# Patient Record
Sex: Male | Born: 1950 | Race: White | Hispanic: No | Marital: Married | Smoking: Never smoker
Health system: Southern US, Community
[De-identification: ages and names within clinical notes are randomized; demographics above are authoritative.]

## PROBLEM LIST (undated history)

## (undated) DIAGNOSIS — F419 Anxiety disorder, unspecified: Secondary | ICD-10-CM

## (undated) DIAGNOSIS — C801 Malignant (primary) neoplasm, unspecified: Secondary | ICD-10-CM

## (undated) DIAGNOSIS — E785 Hyperlipidemia, unspecified: Secondary | ICD-10-CM

## (undated) DIAGNOSIS — E1165 Type 2 diabetes mellitus with hyperglycemia: Secondary | ICD-10-CM

## (undated) DIAGNOSIS — N529 Male erectile dysfunction, unspecified: Secondary | ICD-10-CM

## (undated) DIAGNOSIS — I639 Cerebral infarction, unspecified: Secondary | ICD-10-CM

## (undated) DIAGNOSIS — K219 Gastro-esophageal reflux disease without esophagitis: Secondary | ICD-10-CM

## (undated) DIAGNOSIS — M1990 Unspecified osteoarthritis, unspecified site: Secondary | ICD-10-CM

## (undated) DIAGNOSIS — I10 Essential (primary) hypertension: Secondary | ICD-10-CM

## (undated) DIAGNOSIS — E119 Type 2 diabetes mellitus without complications: Secondary | ICD-10-CM

## (undated) DIAGNOSIS — G4733 Obstructive sleep apnea (adult) (pediatric): Secondary | ICD-10-CM

## (undated) HISTORY — DX: Cerebral infarction, unspecified: I63.9

## (undated) HISTORY — DX: Hyperlipidemia, unspecified: E78.5

## (undated) HISTORY — DX: Essential (primary) hypertension: I10

## (undated) HISTORY — PX: COLONOSCOPY: SHX174

## (undated) HISTORY — DX: Obstructive sleep apnea (adult) (pediatric): G47.33

## (undated) HISTORY — PX: CARDIAC CATHETERIZATION: SHX172

## (undated) HISTORY — DX: Male erectile dysfunction, unspecified: N52.9

## (undated) HISTORY — PX: TONSILLECTOMY: SUR1361

## (undated) HISTORY — PX: KNEE ARTHROSCOPY: SHX127

## (undated) HISTORY — DX: Gastro-esophageal reflux disease without esophagitis: K21.9

---

## 2004-10-06 ENCOUNTER — Other Ambulatory Visit: Payer: Self-pay

## 2004-10-06 ENCOUNTER — Emergency Department: Payer: Self-pay | Admitting: Emergency Medicine

## 2004-10-12 ENCOUNTER — Ambulatory Visit: Payer: Self-pay | Admitting: Gerontology

## 2006-09-27 IMAGING — CT CT HEAD WITHOUT CONTRAST
1 series · 16 of 30 positions shown, 20 images · non-contrast
Comparison: none

REASON FOR EXAM: Dizziness
COMMENTS:

[Series 2: without · axial · non-contrast · 0.39mm/px · z∈[+282,+417]mm · 16 of 30 slices shown, 20 images]
[im 2/30  brain]
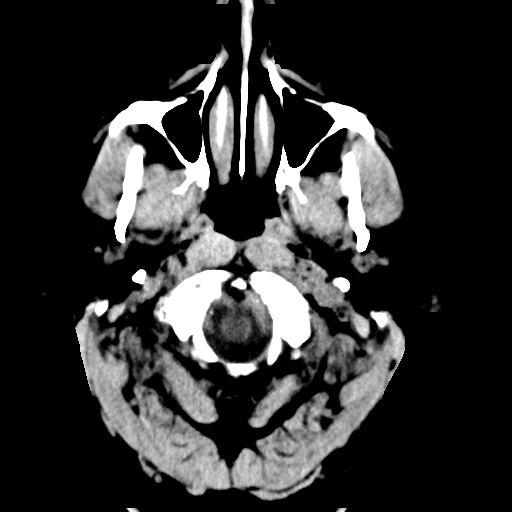
[im 2/30  bone]
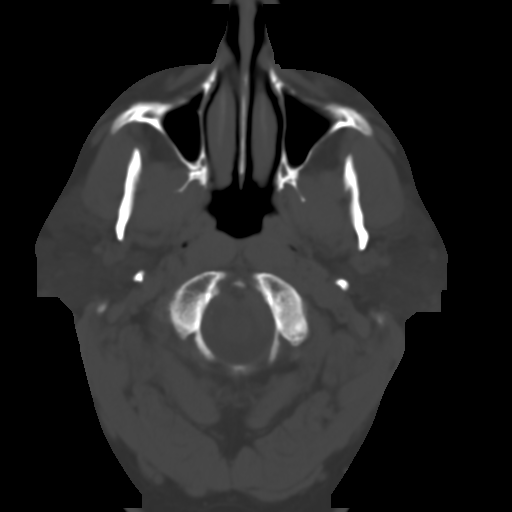
[im 4/30  brain]
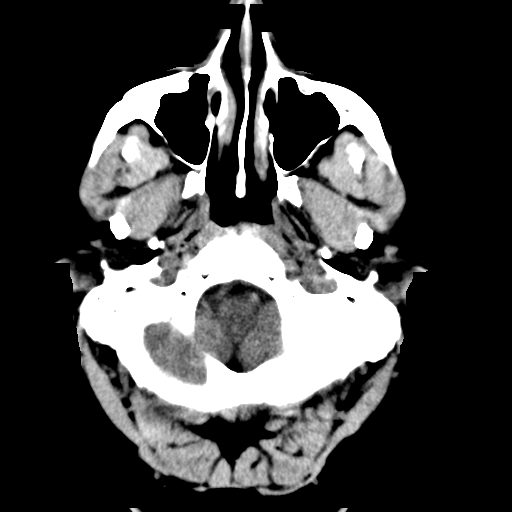
[im 6/30  brain]
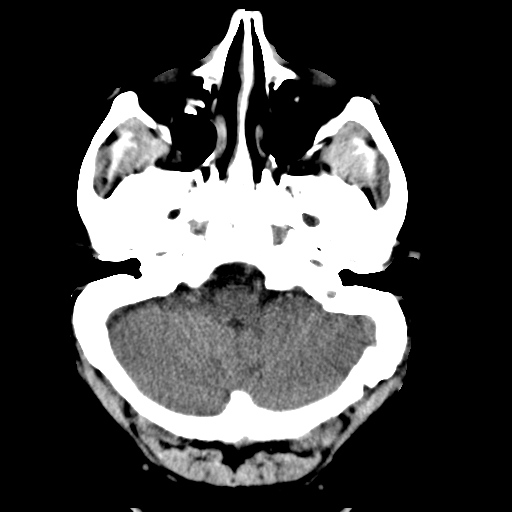
[im 8/30  brain]
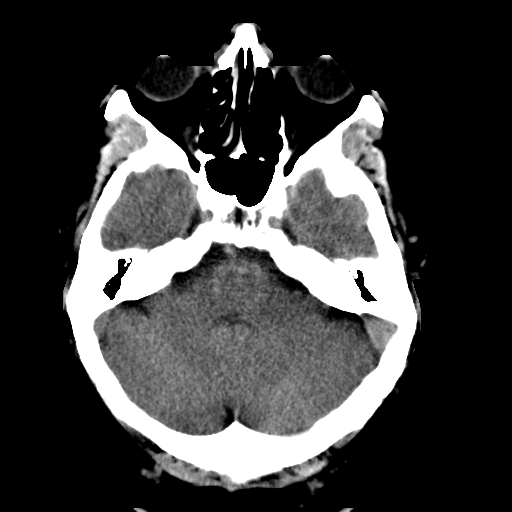
[im 9/30  brain]
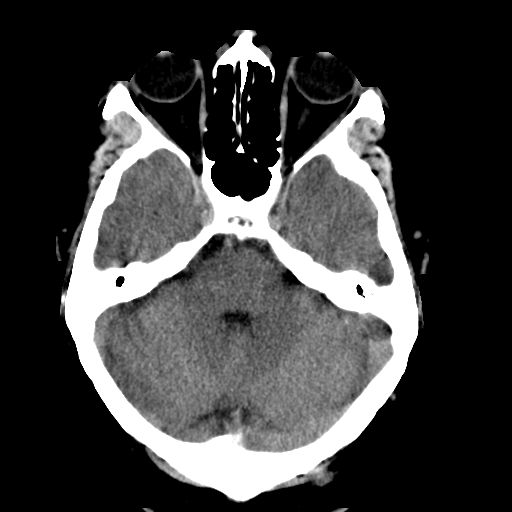
[im 9/30  bone]
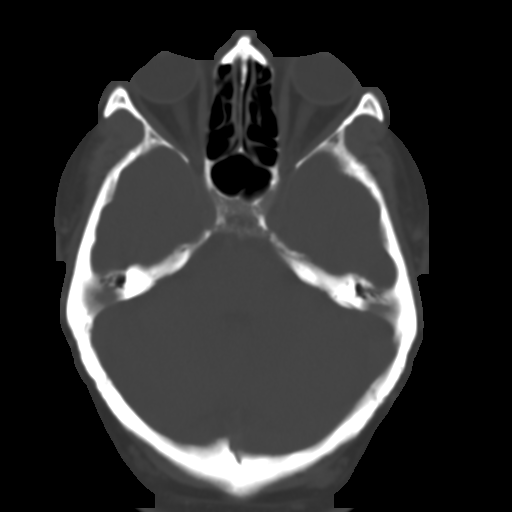
[im 11/30  brain]
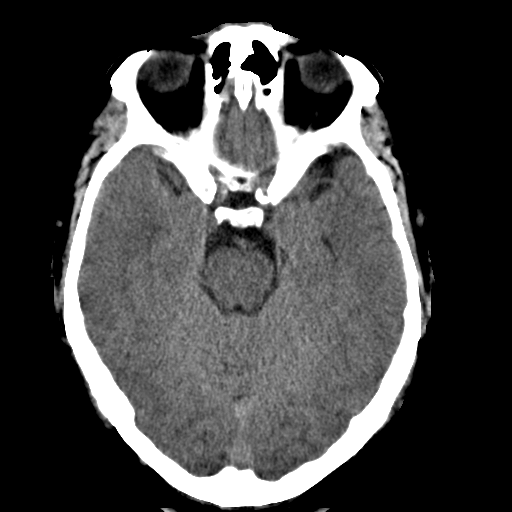
[im 13/30  brain]
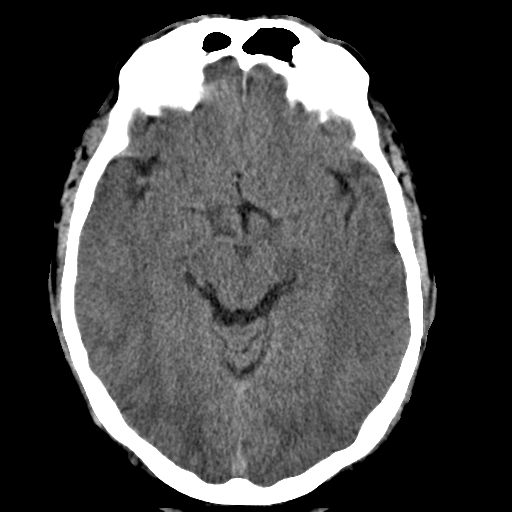
[im 15/30  brain]
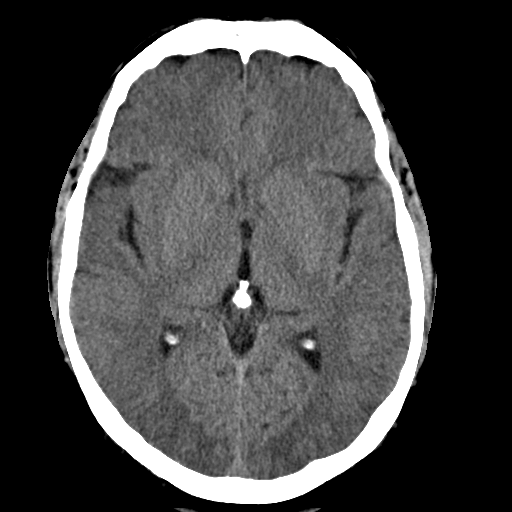
[im 16/30  brain]
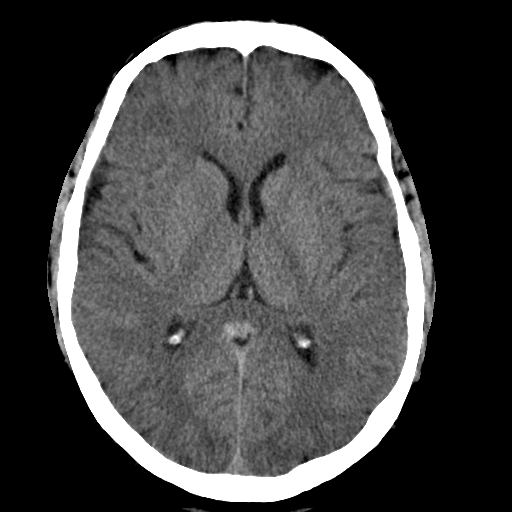
[im 16/30  bone]
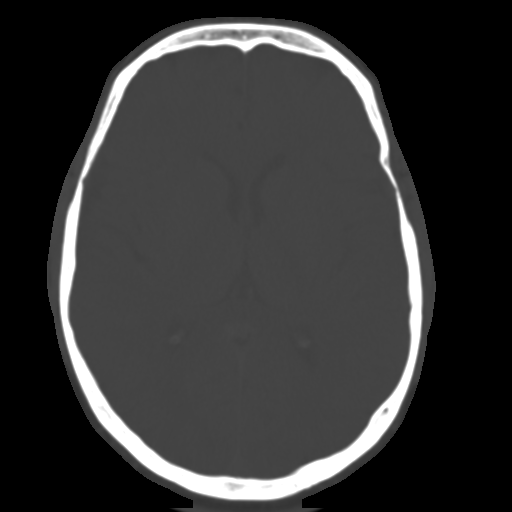
[im 18/30  brain]
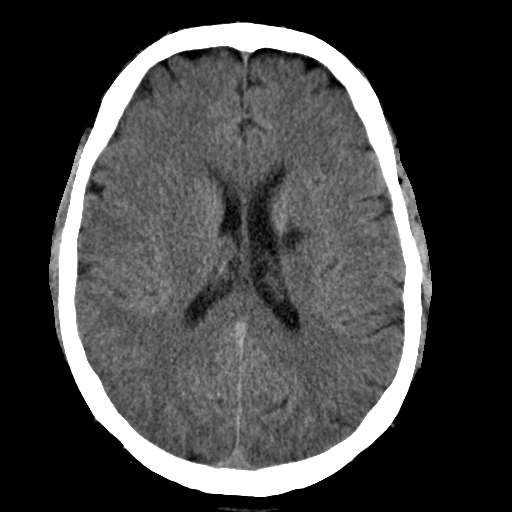
[im 20/30  brain]
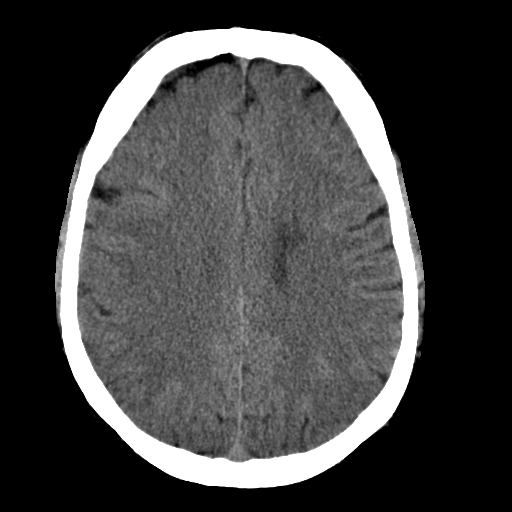
[im 22/30  brain]
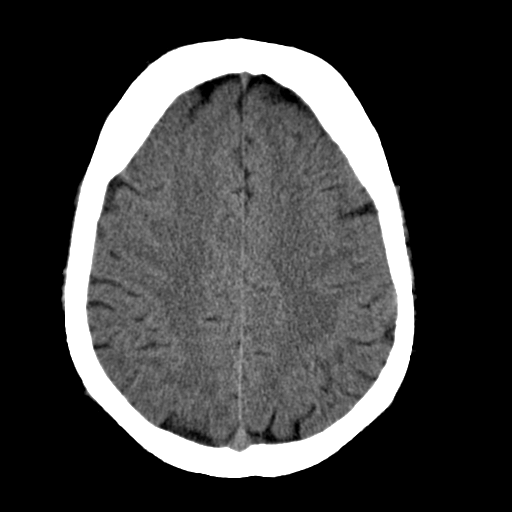
[im 23/30  brain]
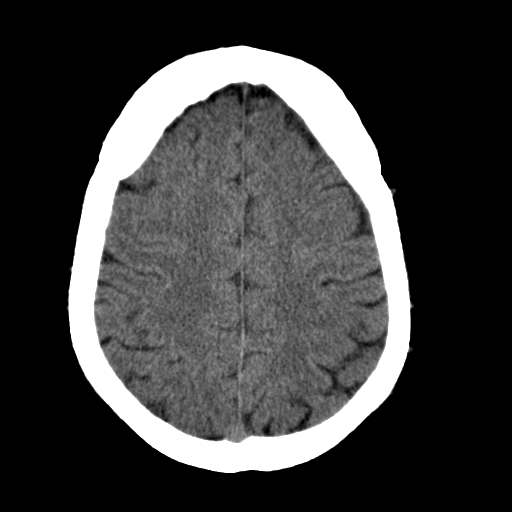
[im 23/30  bone]
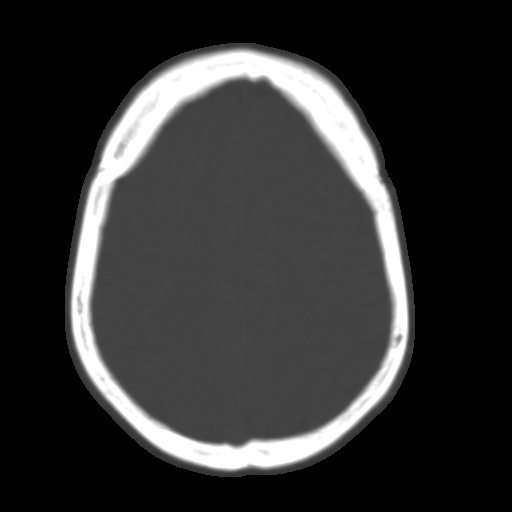
[im 25/30  brain]
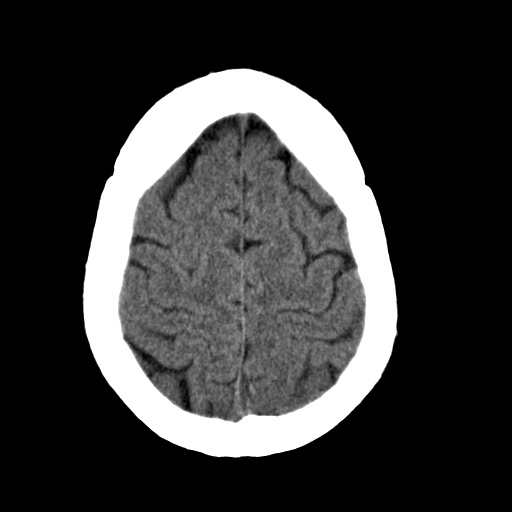
[im 27/30  brain]
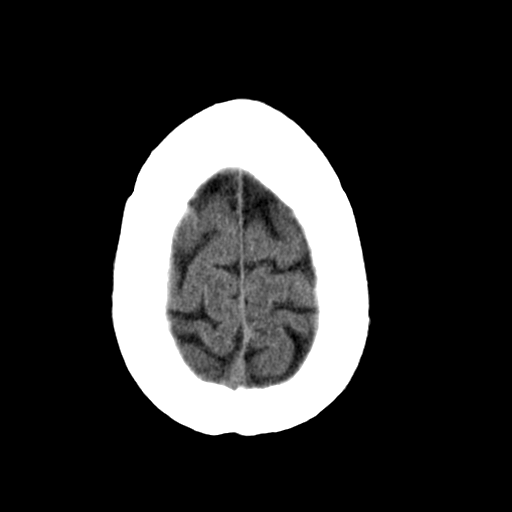
[im 29/30  brain]
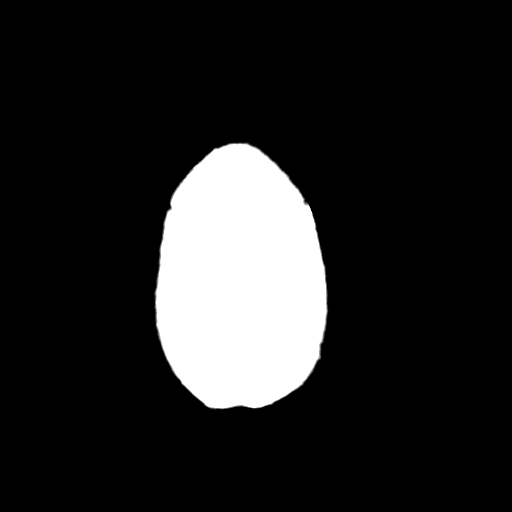

[16 of 30 positions shown; findings below may reference images not displayed]

PROCEDURE:     CT  - CT HEAD WITHOUT CONTRAST  - October 06, 2004  [DATE]

RESULT:     Noncontrast emergent CT of the brain shows an old area of
encephalomalacia in a small area adjacent to the lateral ventricle on the
LEFT in the parietal region.  There is no other encephalomalacia.  The
ventricles and sulci appear to be within normal limits.  The visualized
paranasal sinuses are normal.  There is no hemorrhage, mass effect or
midline shift.
IMPRESSION: 1.     No acute intracranial abnormality.
2.     Old LEFT parietal periventricular white matter infarct.

## 2007-02-27 HISTORY — PX: SHOULDER ARTHROSCOPY: SHX128

## 2007-03-18 ENCOUNTER — Ambulatory Visit: Payer: Self-pay

## 2007-04-15 ENCOUNTER — Ambulatory Visit: Payer: Self-pay | Admitting: Orthopaedic Surgery

## 2007-04-15 ENCOUNTER — Other Ambulatory Visit: Payer: Self-pay

## 2007-04-22 ENCOUNTER — Ambulatory Visit: Payer: Self-pay | Admitting: Orthopaedic Surgery

## 2007-07-10 ENCOUNTER — Emergency Department: Payer: Self-pay | Admitting: Unknown Physician Specialty

## 2008-05-30 ENCOUNTER — Emergency Department: Payer: Self-pay | Admitting: Emergency Medicine

## 2013-06-06 DIAGNOSIS — E1122 Type 2 diabetes mellitus with diabetic chronic kidney disease: Secondary | ICD-10-CM | POA: Insufficient documentation

## 2013-06-06 DIAGNOSIS — I10 Essential (primary) hypertension: Secondary | ICD-10-CM | POA: Insufficient documentation

## 2013-06-06 DIAGNOSIS — E1169 Type 2 diabetes mellitus with other specified complication: Secondary | ICD-10-CM | POA: Insufficient documentation

## 2013-06-06 DIAGNOSIS — E785 Hyperlipidemia, unspecified: Secondary | ICD-10-CM | POA: Insufficient documentation

## 2013-06-06 DIAGNOSIS — G4733 Obstructive sleep apnea (adult) (pediatric): Secondary | ICD-10-CM | POA: Insufficient documentation

## 2013-09-10 ENCOUNTER — Ambulatory Visit: Payer: Self-pay | Admitting: Gastroenterology

## 2013-09-11 LAB — PATHOLOGY REPORT

## 2015-01-26 DIAGNOSIS — Z0000 Encounter for general adult medical examination without abnormal findings: Secondary | ICD-10-CM | POA: Insufficient documentation

## 2015-02-07 ENCOUNTER — Encounter: Payer: Self-pay | Admitting: Dietician

## 2015-02-07 ENCOUNTER — Encounter: Payer: PPO | Attending: Internal Medicine | Admitting: Dietician

## 2015-02-07 VITALS — BP 139/62 | Ht 68.0 in | Wt 239.4 lb

## 2015-02-07 DIAGNOSIS — IMO0002 Reserved for concepts with insufficient information to code with codable children: Secondary | ICD-10-CM

## 2015-02-07 DIAGNOSIS — E119 Type 2 diabetes mellitus without complications: Secondary | ICD-10-CM | POA: Diagnosis not present

## 2015-02-07 DIAGNOSIS — E1065 Type 1 diabetes mellitus with hyperglycemia: Secondary | ICD-10-CM

## 2015-02-07 DIAGNOSIS — E108 Type 1 diabetes mellitus with unspecified complications: Secondary | ICD-10-CM

## 2015-02-07 NOTE — Progress Notes (Signed)
Diabetes Self-Management Education  Visit Type: First/Initial  Appt. Start Time: 1030 Appt. End Time: 1130  02/07/2015  [name], identified by name and date of birth, is a 64 y.o. male with a diagnosis of Diabetes: Type 2.   ASSESSMENT  Blood pressure 139/62, height 5\' 8"  (1.727 m), weight 239 lb 6.4 oz (108.591 kg). Body mass index is 36.41 kg/(m^2).  Lacks knowledge of diabetes care Pt has an old BG meter but is not checking BG's       Diabetes Self-Management Education - 02/07/15 1310    Visit Information   Visit Type First/Initial   Initial Visit   Diabetes Type Type 2   Health Coping   How would you rate your overall health? Good   Psychosocial Assessment   [name] Belief/Attitude about Diabetes Motivated to manage diabetes   Self-care barriers None   Other persons present Spouse/SO   [name] Concerns Glycemic Control;Weight Control;Healthy Lifestyle;Medication  prevent complications   Special Needs None   Preferred Learning Style Auditory;Hands on   Learning Readiness Ready   What is the last grade level you completed in school? 12   Complications   Last HgB A1C per [name]/outside source 8.3 %  01-19-15   How often do you check your blood sugar? 0 times/day (not testing)   Have you had a dilated eye exam in the past 12 months? Yes   Have you had a dental exam in the past 12 months? Yes   Are you checking your feet? No   Dietary Intake   Breakfast --  breakfast time varies 7-9am   Lunch --  eats lunch 1-2p; eats sweets/desserts 4-5x/wk. and snack foods/fried foods 2-3x/wk.   Dinner --  dinner time varies 5-7p   Snack (evening) --  eats bedtime snack occasionally=ice cream   Beverage(s) --  drinks occasional regular soda and drinks coffee and tea with sugar=4-5/day   Exercise   Exercise Type ADL's   [name] Education   Previous Diabetes Education No   Disease state  --  definition/pathophysiology  of type 2 diabetes and options for treatment   Nutrition management  Role of diet in the treatment of diabetes and the relationship between the three main macronutrients and blood glucose level;Food label reading, portion sizes and measuring food.;Carbohydrate counting   Physical activity and exercise  Role of exercise on diabetes management, blood pressure control and cardiac health.;Helped [name] identify appropriate exercises in relation to his/her diabetes, diabetes complications and other health issue.   Medications Reviewed patients medication for diabetes, action, purpose, timing of dose and side effects.   Monitoring Taught/evaluated SMBG meter.;Purpose and frequency of SMBG.;Taught/discussed recording of test results and interpretation of SMBG.;Identified appropriate SMBG and/or A1C goals.;Yearly dilated eye exam  gave pt Ultra One Touch 2 meter and instructed on its use=BG 158 (2hr pp)   Chronic complications Relationship between chronic complications and blood glucose control   Psychosocial adjustment Role of stress on diabetes   Personal strategies to promote health Lifestyle issues that need to be addressed for better diabetes care;Helped [name] develop diabetes management plan for (enter comment)      Individualized Plan for Diabetes Self-Management Training:   Learning Objective:  [name] will have a greater understanding of diabetes self-management. [name] education plan is to attend individual and/or group sessions per assessed needs and concerns.   Plan:   Check BG's 2x/day before breakfast and 2 hr pp supper and record   Try riding bike or walking 15-20 min. 3-4x/wk.  Avoid sweetened beverages and fruit juices  Limit intake of sweet/desserts, snack foods  and fried foods  Eat 3 balanced meals/day and a small bedtime snack  Make an eye appointment  Call doctor for prescription for: Ultra One Touch test strips and One Touch Delica lancets-check BG 2x/day  Complete a 3 day food record and bring to next  appointment  Return for follow up visit with a dietitian on 02-18-15   Expected Outcomes:   Good  Education material provided: General Meal Planning Guidelines, Ultra One Touch 2 meter  If problems or questions, [name] to contact team via:  [phone]  Future DSME appointment:  02-18-15

## 2015-02-07 NOTE — Patient Instructions (Signed)
  Check blood sugars 2 x day before breakfast and 2 hrs after supper every day Exercise:  Try riding bike or walking 15-20 min 3-4x/wk. Avoid sugar sweetened drinks (soda, tea, coffee, sports drinks, juices) Limit intake of sweets, snack foods and fried foods Eat 3 meals day,   1  snacks a day at bedtime Space meals 4-6 hours apart Make an eye doctor appointment Bring blood sugar records to the next appointment/class Call your doctor for a prescription for:  1. Meter strips (type)  Ultra One Touch test strips  checking  2 times per day  2. Lancets (type)  One Touch Delica lancets  checking  2   times per day Get a Sharps container Complete a 3 day food record and bring to next appointment Return for appointment on:  02-18-15

## 2015-02-17 ENCOUNTER — Ambulatory Visit: Payer: PPO | Attending: Internal Medicine

## 2015-02-17 DIAGNOSIS — G4733 Obstructive sleep apnea (adult) (pediatric): Secondary | ICD-10-CM | POA: Diagnosis not present

## 2015-02-18 ENCOUNTER — Ambulatory Visit: Payer: PPO | Admitting: Dietician

## 2015-02-22 ENCOUNTER — Encounter: Payer: PPO | Admitting: Dietician

## 2015-02-22 ENCOUNTER — Encounter: Payer: Self-pay | Admitting: Dietician

## 2015-02-22 VITALS — BP 136/76 | Ht 68.0 in | Wt 231.6 lb

## 2015-02-22 DIAGNOSIS — E119 Type 2 diabetes mellitus without complications: Secondary | ICD-10-CM | POA: Diagnosis not present

## 2015-02-22 NOTE — Patient Instructions (Addendum)
To balance meals with protein, 2-4 servings( 30-60 grams) of carbohydrate per meal, non-starchy vegetables. To include at least 2 servings of carbohydrate per meal. To add serving of fruit at lunch meal. To increase intake of fruits/vegetables with goal of 5 servings per day. To read labels for carbohydrate grams, saturated fat and trans fat with goal of no more than 12 gms saturated fat per day. To avoid trans fats.

## 2015-02-22 NOTE — Progress Notes (Signed)
Diabetes Self-Management Education  Visit Type:  Follow-up  Appt. Start Time: 1500 Appt. End Time: 1600  02/22/2015  [name] Patient, identified by name and date of birth, is a 64 y.o. male with a diagnosis of Diabetes:  .   ASSESSMENT  Blood pressure 136/76, height 5\' 8"  (1.727 m), weight 231 lb 9.6 oz (105.053 kg). Body mass index is 35.22 kg/(m^2).       Diabetes Self-Management Education - [id number]    Complications   Last HgB A1C per patient/outside source 8.3 %   How often do you check your blood sugar? 1-2 times/day   Fasting Blood glucose range (mg/dL) 130-179   Postprandial Blood glucose range (mg/dL) 130-179;180-200   Have you had a dilated eye exam in the past 12 months? Yes  02/2014   Have you had a dental exam in the past 12 months? Yes   Are you checking your feet? No   Dietary Intake   Breakfast 8:30 bacon/egg sandwich on whole wheat bread, coffee with cream, water or 2 eggs, 1/2c.grits, 12oz. milk, 1 sl. whole wheat toast    Lunch 12-1:30pm- chicken salad or bologna sandwich, water   Snack (afternoon) occasionally had peanut butter/crackers   Dinner 5:50-7:30pm grilled chicken salad with [place] drsg, water or hamburger steak with grilled onions, mushrooms, slaw, sweet potato, salad   Snack (evening) rarely eats an evening snack   Exercise   Exercise Type Light (walking / raking leaves)   How many days per week to you exercise? 7   How many minutes per day do you exercise? 15   Total minutes per week of exercise 105   Patient Education   Nutrition management  Role of diet in the treatment of diabetes and the relationship between the three main macronutrients and blood glucose level;Food label reading, portion sizes and measuring food.;Carbohydrate counting;Effects of alcohol on blood glucose and safety factors with consumption of alcohol.;Meal options for control of blood glucose level and chronic complications.   Physical activity and exercise  Role of  exercise on diabetes management, blood pressure control and cardiac health.   Chronic complications Assessed and discussed foot care and prevention of foot problems;Relationship between chronic complications and blood glucose control   Outcomes   Program Status Completed      Learning Objective:  Patient will have a greater understanding of diabetes self-management. Patient education plan is to attend individual and/or group sessions per assessed needs and concerns.   Plan:   Patient Instructions  To include at least 2 servings of carbohydrate per meal. To add serving of fruit at lunch meal. To increase intake of fruits/vegetables with goal of 5 servings per day. To read labels for carbohydrate grams, saturated fat and trans fat.    Expected Outcomes:  Demonstrated interest in learning. Expect positive outcomes  Education material provided:"Planning a Balanced Meal"  If problems or questions, patient to contact team via: Karolee Stamps, RD,CDE [phone] Future DSME appointment: - PRN

## 2018-11-25 DIAGNOSIS — M1711 Unilateral primary osteoarthritis, right knee: Secondary | ICD-10-CM | POA: Insufficient documentation

## 2019-03-17 ENCOUNTER — Encounter
Admission: RE | Admit: 2019-03-17 | Discharge: 2019-03-17 | Disposition: A | Discharge status: Home / Self Care | Payer: Medicare Other | Source: Ambulatory Visit | Attending: Orthopedic Surgery | Admitting: Orthopedic Surgery

## 2019-03-17 ENCOUNTER — Other Ambulatory Visit: Payer: Self-pay

## 2019-03-17 ENCOUNTER — Encounter: Payer: Self-pay | Admitting: Orthopedic Surgery

## 2019-03-17 NOTE — Patient Instructions (Addendum)
Your procedure is scheduled on: 03/20/19 Report to [facility]. To find out your arrival time please call [phone] between 1PM - 3PM on 03/19/19.  Remember: Instructions that are not followed completely may result in serious medical risk, up to and including death, or upon the discretion of your surgeon and anesthesiologist your surgery may need to be rescheduled.     _X__ 1. Do not eat food after midnight the night before your procedure.                 No gum chewing or hard candies. You may drink clear liquids up to 2 hours                 before you are scheduled to arrive for your surgery- DO not drink clear                 liquids within 2 hours of the start of your surgery.                 Clear Liquids include:  water, apple juice without pulp, clear carbohydrate                 drink such as Clearfast or Gatorade, Black Coffee or Tea (Do not add                 anything to coffee or tea). Diabetics water only  __X__2.  On the morning of surgery brush your teeth with toothpaste and water, you                 may rinse your mouth with mouthwash if you wish.  Do not swallow any              toothpaste of mouthwash.     _X__ 3.  No Alcohol for 24 hours before or after surgery.   _X__ 4.  Do Not Smoke or use e-cigarettes For 24 Hours Prior to Your Surgery.                 Do not use any chewable tobacco products for at least 6 hours prior to                 surgery.  ____  5.  Bring all medications with you on the day of surgery if instructed.   __X__  6.  Notify your doctor if there is any change in your medical condition      (cold, fever, infections).     Do not wear jewelry, make-up, hairpins, clips or nail polish. Do not wear lotions, powders, or perfumes.  Do not shave 48 hours prior to surgery. Men may shave face and neck. Do not bring valuables to the hospital.    [facility] is not responsible for any belongings or  valuables.  Contacts, dentures/partials or body piercings may not be worn into surgery. Bring a case for your contacts, glasses or hearing aids, a denture cup will be supplied. Leave your suitcase in the car. After surgery it may be brought to your room. For patients admitted to the hospital, discharge time is determined by your treatment team.   Patients discharged the day of surgery will not be allowed to drive home.   Please read over the following fact sheets that you were given:   MRSA Information  __X__ Take these medicines the morning of surgery with A SIP OF WATER:  1. aMLODIPINE  2. pEPCID  3. pRAVASTATIN  4.  5.  6.  ____ Fleet Enema (as directed)   __X__ Use CHG Soap/SAGE wipes as directed  ____ Use inhalers on the day of surgery  _X___ Stop metformin/Janumet/Farxiga 2 days prior to surgery    ____ Take 1/2 of usual insulin dose the night before surgery. No insulin the morning          of surgery.   ____ Stop Blood Thinners Coumadin/Plavix/Xarelto/Pleta/Pradaxa/Eliquis/Effient/Aspirin  on   Or contact your Surgeon, Cardiologist or Medical Doctor regarding  ability to stop your blood thinners  __X__ Stop Anti-inflammatories 7 days before surgery such as Advil, Ibuprofen, Motrin,  BC or Goodies Powder, Naprosyn, Naproxen, Aleve, Aspirin    __X__ Stop all herbal supplements, fish oil or vitamin E until after surgery.    __X__ Bring C-Pap to the hospital.      G2 Gatorade to drink 2 hours before arrival to day surgery.

## 2019-03-18 ENCOUNTER — Encounter
Admission: RE | Admit: 2019-03-18 | Discharge: 2019-03-18 | Disposition: A | Discharge status: Home / Self Care | Payer: Medicare Other | Source: Ambulatory Visit | Attending: Orthopedic Surgery | Admitting: Orthopedic Surgery

## 2019-03-18 DIAGNOSIS — Z20822 Contact with and (suspected) exposure to covid-19: Secondary | ICD-10-CM | POA: Diagnosis not present

## 2019-03-18 DIAGNOSIS — I4510 Unspecified right bundle-branch block: Secondary | ICD-10-CM | POA: Insufficient documentation

## 2019-03-18 DIAGNOSIS — R9431 Abnormal electrocardiogram [ECG] [EKG]: Secondary | ICD-10-CM | POA: Insufficient documentation

## 2019-03-18 DIAGNOSIS — Z01818 Encounter for other preprocedural examination: Secondary | ICD-10-CM | POA: Diagnosis not present

## 2019-03-18 LAB — COMPREHENSIVE METABOLIC PANEL
ALT: 32 U/L (ref 0–44)
AST: 23 U/L (ref 15–41)
Albumin: 4.2 g/dL (ref 3.5–5.0)
Alkaline Phosphatase: 54 U/L (ref 38–126)
Anion gap: 10 (ref 5–15)
BUN: 20 mg/dL (ref 8–23)
CO2: 27 mmol/L (ref 22–32)
Calcium: 9.8 mg/dL (ref 8.9–10.3)
Chloride: 100 mmol/L (ref 98–111)
Creatinine, Ser: 1.14 mg/dL (ref 0.61–1.24)
GFR calc Af Amer: >60 mL/min (ref >60)
GFR calc non Af Amer: >60 mL/min (ref >60)
Glucose, Bld: 133 mg/dL — ABNORMAL HIGH (ref 70–99)
Potassium: 4 mmol/L (ref 3.5–5.1)
Sodium: 137 mmol/L (ref 135–145)
Total Bilirubin: 0.9 mg/dL (ref 0.3–1.2)
Total Protein: 8.2 g/dL — ABNORMAL HIGH (ref 6.5–8.1)

## 2019-03-18 LAB — CBC
HCT: 47 % (ref 39.0–52.0)
Hemoglobin: 15.9 g/dL (ref 13.0–17.0)
MCH: 30 pg (ref 26.0–34.0)
MCHC: 33.8 g/dL (ref 30.0–36.0)
MCV: 88.7 fL (ref 80.0–100.0)
Platelets: 270 10*3/uL (ref 150–400)
RBC: 5.3 MIL/uL (ref 4.22–5.81)
RDW: 13.7 % (ref 11.5–15.5)
WBC: 8.2 10*3/uL (ref 4.0–10.5)
nRBC: 0 % (ref 0.0–0.2)

## 2019-03-18 LAB — PROTIME-INR
INR: 1 (ref 0.8–1.2)
Prothrombin Time: 12.9 seconds (ref 11.4–15.2)

## 2019-03-18 LAB — URINALYSIS, ROUTINE W REFLEX MICROSCOPIC
Bacteria, UA: NONE SEEN
Bilirubin Urine: NEGATIVE
Glucose, UA: NEGATIVE mg/dL
Hgb urine dipstick: NEGATIVE
Ketones, ur: NEGATIVE mg/dL
Nitrite: NEGATIVE
Protein, ur: NEGATIVE mg/dL
Specific Gravity, Urine: 1.009 (ref 1.005–1.030)
Squamous Epithelial / HPF: NONE SEEN (ref 0–5)
pH: 5 (ref 5.0–8.0)

## 2019-03-18 LAB — SURGICAL PCR SCREEN
MRSA, PCR: NEGATIVE
Staphylococcus aureus: NEGATIVE

## 2019-03-18 LAB — HEMOGLOBIN A1C
Hgb A1c MFr Bld: 6.9 % — ABNORMAL HIGH (ref 4.8–5.6)
Mean Plasma Glucose: 151.33 mg/dL

## 2019-03-18 LAB — APTT: aPTT: 30 seconds (ref 24–36)

## 2019-03-18 LAB — TYPE AND SCREEN
ABO/RH(D): O POS
Antibody Screen: NEGATIVE

## 2019-03-18 LAB — C-REACTIVE PROTEIN: CRP: 1.8 mg/dL — ABNORMAL HIGH (ref <1.0)

## 2019-03-18 LAB — SEDIMENTATION RATE: Sed Rate: 15 mm/hr (ref 0–20)

## 2019-03-18 LAB — SARS CORONAVIRUS 2 (TAT 6-24 HRS): SARS Coronavirus 2: NEGATIVE

## 2019-03-19 LAB — URINE CULTURE
Culture: NO GROWTH
Special Requests: NORMAL

## 2019-03-19 MED ORDER — TRANEXAMIC ACID-NACL 1000-0.7 MG/100ML-% IV SOLN
1000.0000 mg | INTRAVENOUS | Status: AC
  Administered 2019-03-20: 08:00:00 1000 mg via INTRAVENOUS

## 2019-03-20 ENCOUNTER — Other Ambulatory Visit: Payer: Self-pay

## 2019-03-20 ENCOUNTER — Ambulatory Visit: Payer: Medicare Other | Admitting: Anesthesiology

## 2019-03-20 ENCOUNTER — Ambulatory Visit
Admission: RE | Admit: 2019-03-20 | Discharge: 2019-03-20 | Disposition: A | Discharge status: Home / Self Care | Payer: Medicare Other | Attending: Orthopedic Surgery | Admitting: Orthopedic Surgery

## 2019-03-20 ENCOUNTER — Encounter: Payer: Self-pay | Admitting: Orthopedic Surgery

## 2019-03-20 ENCOUNTER — Ambulatory Visit: Payer: Medicare Other

## 2019-03-20 ENCOUNTER — Encounter
Admission: RE | Disposition: A | Discharge status: Home / Self Care | Payer: Self-pay | Source: Home / Self Care | Attending: Orthopedic Surgery

## 2019-03-20 DIAGNOSIS — Z833 Family history of diabetes mellitus: Secondary | ICD-10-CM | POA: Insufficient documentation

## 2019-03-20 DIAGNOSIS — N182 Chronic kidney disease, stage 2 (mild): Secondary | ICD-10-CM | POA: Insufficient documentation

## 2019-03-20 DIAGNOSIS — F329 Major depressive disorder, single episode, unspecified: Secondary | ICD-10-CM | POA: Diagnosis not present

## 2019-03-20 DIAGNOSIS — Z96652 Presence of left artificial knee joint: Secondary | ICD-10-CM

## 2019-03-20 DIAGNOSIS — M1712 Unilateral primary osteoarthritis, left knee: Secondary | ICD-10-CM | POA: Diagnosis not present

## 2019-03-20 DIAGNOSIS — K219 Gastro-esophageal reflux disease without esophagitis: Secondary | ICD-10-CM | POA: Insufficient documentation

## 2019-03-20 DIAGNOSIS — G4730 Sleep apnea, unspecified: Secondary | ICD-10-CM | POA: Insufficient documentation

## 2019-03-20 DIAGNOSIS — Z8673 Personal history of transient ischemic attack (TIA), and cerebral infarction without residual deficits: Secondary | ICD-10-CM | POA: Diagnosis not present

## 2019-03-20 DIAGNOSIS — Z79899 Other long term (current) drug therapy: Secondary | ICD-10-CM | POA: Insufficient documentation

## 2019-03-20 DIAGNOSIS — Z888 Allergy status to other drugs, medicaments and biological substances status: Secondary | ICD-10-CM | POA: Diagnosis not present

## 2019-03-20 DIAGNOSIS — I129 Hypertensive chronic kidney disease with stage 1 through stage 4 chronic kidney disease, or unspecified chronic kidney disease: Secondary | ICD-10-CM | POA: Diagnosis not present

## 2019-03-20 DIAGNOSIS — E785 Hyperlipidemia, unspecified: Secondary | ICD-10-CM | POA: Insufficient documentation

## 2019-03-20 DIAGNOSIS — Z7982 Long term (current) use of aspirin: Secondary | ICD-10-CM | POA: Insufficient documentation

## 2019-03-20 DIAGNOSIS — E781 Pure hyperglyceridemia: Secondary | ICD-10-CM | POA: Insufficient documentation

## 2019-03-20 DIAGNOSIS — E1122 Type 2 diabetes mellitus with diabetic chronic kidney disease: Secondary | ICD-10-CM | POA: Insufficient documentation

## 2019-03-20 DIAGNOSIS — Z7984 Long term (current) use of oral hypoglycemic drugs: Secondary | ICD-10-CM | POA: Insufficient documentation

## 2019-03-20 DIAGNOSIS — Z8249 Family history of ischemic heart disease and other diseases of the circulatory system: Secondary | ICD-10-CM | POA: Insufficient documentation

## 2019-03-20 DIAGNOSIS — Z96659 Presence of unspecified artificial knee joint: Secondary | ICD-10-CM

## 2019-03-20 HISTORY — PX: KNEE ARTHROPLASTY: SHX992

## 2019-03-20 HISTORY — DX: Type 2 diabetes mellitus without complications: E11.9

## 2019-03-20 LAB — GLUCOSE, CAPILLARY
Glucose-Capillary: 157 mg/dL — ABNORMAL HIGH (ref 70–99)
Glucose-Capillary: 208 mg/dL — ABNORMAL HIGH (ref 70–99)

## 2019-03-20 LAB — ABO/RH: ABO/RH(D): O POS

## 2019-03-20 SURGERY — ARTHROPLASTY, KNEE, TOTAL, USING IMAGELESS COMPUTER-ASSISTED NAVIGATION
Anesthesia: Spinal | Site: Knee | Laterality: Left

## 2019-03-20 MED ORDER — MENTHOL 3 MG MT LOZG
1.0000 | LOZENGE | OROMUCOSAL | Status: DC | PRN
  Filled 2019-03-20: qty 9

## 2019-03-20 MED ORDER — BUPIVACAINE HCL (PF) 0.5 % IJ SOLN
INTRAMUSCULAR | Status: AC
  Filled 2019-03-20: qty 10

## 2019-03-20 MED ORDER — ACETAMINOPHEN 10 MG/ML IV SOLN
INTRAVENOUS | Status: DC | PRN
  Administered 2019-03-20: 1000 mg via INTRAVENOUS

## 2019-03-20 MED ORDER — LIDOCAINE HCL (PF) 2 % IJ SOLN
INTRAMUSCULAR | Status: AC
  Filled 2019-03-20: qty 10

## 2019-03-20 MED ORDER — ONDANSETRON HCL 4 MG PO TABS: 4.0000 mg | ORAL_TABLET | Freq: Four times a day (QID) | ORAL | Status: DC | PRN

## 2019-03-20 MED ORDER — DEXAMETHASONE SODIUM PHOSPHATE 10 MG/ML IJ SOLN: 8.0000 mg | Freq: Once | INTRAMUSCULAR | Status: AC

## 2019-03-20 MED ORDER — OXYCODONE HCL 5 MG PO TABS
ORAL_TABLET | ORAL | Status: AC
  Filled 2019-03-20: qty 1

## 2019-03-20 MED ORDER — PROPOFOL 500 MG/50ML IV EMUL
INTRAVENOUS | Status: AC
  Filled 2019-03-20: qty 50

## 2019-03-20 MED ORDER — CEFAZOLIN SODIUM-DEXTROSE 2-4 GM/100ML-% IV SOLN: 2.0000 g | Freq: Four times a day (QID) | INTRAVENOUS | Status: DC

## 2019-03-20 MED ORDER — OXYCODONE HCL 5 MG PO TABS: 10.0000 mg | ORAL_TABLET | ORAL | Status: DC | PRN

## 2019-03-20 MED ORDER — NEOMYCIN-POLYMYXIN B GU 40-200000 IR SOLN
Status: DC | PRN
  Administered 2019-03-20: 14 mL

## 2019-03-20 MED ORDER — ACETAMINOPHEN 10 MG/ML IV SOLN
INTRAVENOUS | Status: AC
  Filled 2019-03-20: qty 100

## 2019-03-20 MED ORDER — FERROUS SULFATE 325 (65 FE) MG PO TABS
325.0000 mg | ORAL_TABLET | Freq: Two times a day (BID) | ORAL | Status: DC
  Filled 2019-03-20: qty 1

## 2019-03-20 MED ORDER — BUPIVACAINE HCL (PF) 0.25 % IJ SOLN
INTRAMUSCULAR | Status: DC | PRN
  Administered 2019-03-20: 60 mL

## 2019-03-20 MED ORDER — ONDANSETRON HCL 4 MG/2ML IJ SOLN
INTRAMUSCULAR | Status: AC
  Filled 2019-03-20: qty 2

## 2019-03-20 MED ORDER — TRAMADOL HCL 50 MG PO TABS: 50.0000 mg | ORAL_TABLET | ORAL | Status: DC | PRN

## 2019-03-20 MED ORDER — OXYCODONE HCL 5 MG PO TABS: 5.0000 mg | ORAL_TABLET | ORAL | Status: DC | PRN

## 2019-03-20 MED ORDER — ENSURE PRE-SURGERY PO LIQD
296.0000 mL | Freq: Once | ORAL | Status: DC
  Filled 2019-03-20: qty 296

## 2019-03-20 MED ORDER — CELECOXIB 200 MG PO CAPS: 200.0000 mg | ORAL_CAPSULE | Freq: Two times a day (BID) | ORAL | Status: DC

## 2019-03-20 MED ORDER — BUPIVACAINE HCL (PF) 0.25 % IJ SOLN
INTRAMUSCULAR | Status: AC
  Filled 2019-03-20: qty 60

## 2019-03-20 MED ORDER — CHLORHEXIDINE GLUCONATE 4 % EX LIQD: 60.0000 mL | Freq: Once | CUTANEOUS | Status: DC

## 2019-03-20 MED ORDER — METOCLOPRAMIDE HCL 5 MG/ML IJ SOLN: 5.0000 mg | Freq: Three times a day (TID) | INTRAMUSCULAR | Status: DC | PRN

## 2019-03-20 MED ORDER — CELECOXIB 200 MG PO CAPS: 200.0000 mg | ORAL_CAPSULE | Freq: Two times a day (BID) | ORAL | 2 refills | Status: DC

## 2019-03-20 MED ORDER — METOCLOPRAMIDE HCL 10 MG PO TABS: 10.0000 mg | ORAL_TABLET | Freq: Three times a day (TID) | ORAL | Status: DC

## 2019-03-20 MED ORDER — ENOXAPARIN SODIUM 40 MG/0.4ML ~~LOC~~ SOLN: 40.0000 mg | SUBCUTANEOUS | 0 refills | Status: DC

## 2019-03-20 MED ORDER — PANTOPRAZOLE SODIUM 40 MG PO TBEC
40.0000 mg | DELAYED_RELEASE_TABLET | Freq: Two times a day (BID) | ORAL | Status: DC
  Administered 2019-03-20: 40 mg via ORAL
  Filled 2019-03-20: qty 1

## 2019-03-20 MED ORDER — PROPOFOL 10 MG/ML IV BOLUS
INTRAVENOUS | Status: AC
  Filled 2019-03-20: qty 40

## 2019-03-20 MED ORDER — LACTATED RINGERS IV BOLUS
500.0000 mL | Freq: Once | INTRAVENOUS | Status: AC
  Administered 2019-03-20: 12:00:00 500 mL via INTRAVENOUS

## 2019-03-20 MED ORDER — SENNOSIDES-DOCUSATE SODIUM 8.6-50 MG PO TABS
1.0000 | ORAL_TABLET | Freq: Two times a day (BID) | ORAL | Status: DC
  Administered 2019-03-20: 1 via ORAL
  Filled 2019-03-20: qty 1

## 2019-03-20 MED ORDER — FLEET ENEMA 7-19 GM/118ML RE ENEM: 1.0000 | ENEMA | Freq: Once | RECTAL | Status: DC | PRN

## 2019-03-20 MED ORDER — HYDROMORPHONE HCL 1 MG/ML IJ SOLN: 0.5000 mg | INTRAMUSCULAR | Status: DC | PRN

## 2019-03-20 MED ORDER — PROPOFOL 500 MG/50ML IV EMUL
INTRAVENOUS | Status: DC | PRN
  Administered 2019-03-20: 125 ug/kg/min via INTRAVENOUS

## 2019-03-20 MED ORDER — ACETAMINOPHEN 325 MG PO TABS: 325.0000 mg | ORAL_TABLET | Freq: Four times a day (QID) | ORAL | Status: DC | PRN

## 2019-03-20 MED ORDER — DIPHENHYDRAMINE HCL 12.5 MG/5ML PO ELIX
12.5000 mg | ORAL_SOLUTION | ORAL | Status: DC | PRN
  Filled 2019-03-20: qty 10

## 2019-03-20 MED ORDER — SODIUM CHLORIDE 0.9 % IV SOLN
INTRAVENOUS | Status: DC | PRN
  Administered 2019-03-20: 11:00:00 60 mL

## 2019-03-20 MED ORDER — TRANEXAMIC ACID-NACL 1000-0.7 MG/100ML-% IV SOLN
INTRAVENOUS | Status: AC
  Filled 2019-03-20: qty 100

## 2019-03-20 MED ORDER — MAGNESIUM HYDROXIDE 400 MG/5ML PO SUSP
30.0000 mL | Freq: Every day | ORAL | Status: DC
  Administered 2019-03-20: 30 mL via ORAL
  Filled 2019-03-20: qty 30

## 2019-03-20 MED ORDER — OXYCODONE HCL 5 MG PO TABS
5.0000 mg | ORAL_TABLET | Freq: Once | ORAL | Status: AC | PRN
  Administered 2019-03-20: 5 mg via ORAL

## 2019-03-20 MED ORDER — PROPOFOL 10 MG/ML IV BOLUS
INTRAVENOUS | Status: DC | PRN
  Administered 2019-03-20 (×4): 20 ug via INTRAVENOUS

## 2019-03-20 MED ORDER — CEFAZOLIN SODIUM-DEXTROSE 2-4 GM/100ML-% IV SOLN
INTRAVENOUS | Status: AC
  Filled 2019-03-20: qty 100

## 2019-03-20 MED ORDER — ONDANSETRON HCL 4 MG/2ML IJ SOLN: 4.0000 mg | Freq: Four times a day (QID) | INTRAMUSCULAR | Status: DC | PRN

## 2019-03-20 MED ORDER — MIDAZOLAM HCL 2 MG/2ML IJ SOLN
INTRAMUSCULAR | Status: DC | PRN
  Administered 2019-03-20: 1 mg via INTRAVENOUS

## 2019-03-20 MED ORDER — CEFAZOLIN SODIUM-DEXTROSE 2-4 GM/100ML-% IV SOLN
2.0000 g | INTRAVENOUS | Status: AC
  Administered 2019-03-20: 2 g via INTRAVENOUS

## 2019-03-20 MED ORDER — GABAPENTIN 300 MG PO CAPS
ORAL_CAPSULE | ORAL | Status: AC
  Administered 2019-03-20: 300 mg via ORAL
  Filled 2019-03-20: qty 1

## 2019-03-20 MED ORDER — CELECOXIB 200 MG PO CAPS: 400.0000 mg | ORAL_CAPSULE | Freq: Once | ORAL | Status: AC

## 2019-03-20 MED ORDER — GABAPENTIN 300 MG PO CAPS: 300.0000 mg | ORAL_CAPSULE | Freq: Once | ORAL | Status: AC

## 2019-03-20 MED ORDER — ENOXAPARIN SODIUM 40 MG/0.4ML ~~LOC~~ SOLN: 40.0000 mg | SUBCUTANEOUS | Status: DC

## 2019-03-20 MED ORDER — SODIUM CHLORIDE 0.9 % IV SOLN
INTRAVENOUS | Status: DC | PRN
  Administered 2019-03-20: 20 ug/min via INTRAVENOUS

## 2019-03-20 MED ORDER — FENTANYL CITRATE (PF) 100 MCG/2ML IJ SOLN: 25.0000 ug | INTRAMUSCULAR | Status: DC | PRN

## 2019-03-20 MED ORDER — TRAMADOL HCL 50 MG PO TABS: 50.0000 mg | ORAL_TABLET | Freq: Four times a day (QID) | ORAL | 0 refills | Status: DC | PRN

## 2019-03-20 MED ORDER — BUPIVACAINE LIPOSOME 1.3 % IJ SUSP
INTRAMUSCULAR | Status: AC
  Filled 2019-03-20: qty 20

## 2019-03-20 MED ORDER — TRANEXAMIC ACID-NACL 1000-0.7 MG/100ML-% IV SOLN
INTRAVENOUS | Status: AC
  Administered 2019-03-20: 13:00:00 1000 mg via INTRAVENOUS
  Filled 2019-03-20: qty 100

## 2019-03-20 MED ORDER — DEXAMETHASONE SODIUM PHOSPHATE 10 MG/ML IJ SOLN
INTRAMUSCULAR | Status: AC
  Administered 2019-03-20: 8 mg via INTRAVENOUS
  Filled 2019-03-20: qty 1

## 2019-03-20 MED ORDER — SODIUM CHLORIDE 0.9 % IV SOLN: INTRAVENOUS | Status: DC

## 2019-03-20 MED ORDER — MIDAZOLAM HCL 2 MG/2ML IJ SOLN
INTRAMUSCULAR | Status: AC
  Filled 2019-03-20: qty 2

## 2019-03-20 MED ORDER — OXYCODONE HCL 5 MG/5ML PO SOLN: 5.0000 mg | Freq: Once | ORAL | Status: AC | PRN

## 2019-03-20 MED ORDER — BISACODYL 10 MG RE SUPP
10.0000 mg | Freq: Every day | RECTAL | Status: DC | PRN
  Filled 2019-03-20: qty 1

## 2019-03-20 MED ORDER — SUCCINYLCHOLINE CHLORIDE 20 MG/ML IJ SOLN
INTRAMUSCULAR | Status: AC
  Filled 2019-03-20: qty 1

## 2019-03-20 MED ORDER — CELECOXIB 200 MG PO CAPS
ORAL_CAPSULE | ORAL | Status: AC
  Administered 2019-03-20: 400 mg via ORAL
  Filled 2019-03-20: qty 2

## 2019-03-20 MED ORDER — DEXAMETHASONE SODIUM PHOSPHATE 10 MG/ML IJ SOLN
INTRAMUSCULAR | Status: AC
  Filled 2019-03-20: qty 1

## 2019-03-20 MED ORDER — ACETAMINOPHEN 10 MG/ML IV SOLN: 1000.0000 mg | Freq: Four times a day (QID) | INTRAVENOUS | Status: DC

## 2019-03-20 MED ORDER — PHENYLEPHRINE HCL (PRESSORS) 10 MG/ML IV SOLN
INTRAVENOUS | Status: DC | PRN
  Administered 2019-03-20: 100 ug via INTRAVENOUS

## 2019-03-20 MED ORDER — ALUM & MAG HYDROXIDE-SIMETH 200-200-20 MG/5ML PO SUSP: 30.0000 mL | ORAL | Status: DC | PRN

## 2019-03-20 MED ORDER — NEOMYCIN-POLYMYXIN B GU 40-200000 IR SOLN
Status: AC
  Filled 2019-03-20: qty 20

## 2019-03-20 MED ORDER — PHENOL 1.4 % MT LIQD
1.0000 | OROMUCOSAL | Status: DC | PRN
  Filled 2019-03-20: qty 177

## 2019-03-20 MED ORDER — OXYCODONE HCL 5 MG PO TABS: 5.0000 mg | ORAL_TABLET | ORAL | 0 refills | Status: DC | PRN

## 2019-03-20 MED ORDER — TRANEXAMIC ACID-NACL 1000-0.7 MG/100ML-% IV SOLN: 1000.0000 mg | Freq: Once | INTRAVENOUS | Status: AC

## 2019-03-20 MED ORDER — LACTATED RINGERS IV BOLUS
250.0000 mL | Freq: Once | INTRAVENOUS | Status: AC
  Administered 2019-03-20: 250 mL via INTRAVENOUS

## 2019-03-20 MED ORDER — SODIUM CHLORIDE FLUSH 0.9 % IV SOLN
INTRAVENOUS | Status: AC
  Filled 2019-03-20: qty 40

## 2019-03-20 MED ORDER — METOCLOPRAMIDE HCL 10 MG PO TABS: 5.0000 mg | ORAL_TABLET | Freq: Three times a day (TID) | ORAL | Status: DC | PRN

## 2019-03-20 SURGICAL SUPPLY — 77 items
ATTUNE MED DOME PAT 38 KNEE (Knees) ×2 IMPLANT
ATTUNE MED DOME PAT 38MM KNEE (Knees) ×1 IMPLANT
ATTUNE PS FEM LT SZ 6 CEM KNEE (Femur) ×3 IMPLANT
ATTUNE PSRP INSR SZ6 10 KNEE (Insert) ×2 IMPLANT
ATTUNE PSRP INSR SZ6 10MM KNEE (Insert) ×1 IMPLANT
BASE TIBIA ATTUNE KNEE SYS SZ6 (Knees) ×1 IMPLANT
BATTERY INSTRU NAVIGATION (MISCELLANEOUS) ×12 IMPLANT
BLADE SAW 70X12.5 (BLADE) ×3 IMPLANT
BLADE SAW 90X13X1.19 OSCILLAT (BLADE) ×3 IMPLANT
BLADE SAW 90X25X1.19 OSCILLAT (BLADE) ×3 IMPLANT
BONE CEMENT GENTAMICIN (Cement) ×6 IMPLANT
CANISTER SUCT 3000ML PPV (MISCELLANEOUS) ×3 IMPLANT
CEMENT BONE GENTAMICIN 40 (Cement) ×2 IMPLANT
COOLER POLAR GLACIER W/PUMP (MISCELLANEOUS) ×3 IMPLANT
COVER LIGHT HANDLE STERIS (MISCELLANEOUS) ×3 IMPLANT
COVER WAND RF STERILE (DRAPES) ×3 IMPLANT
CUFF TOURN SGL QUICK 24 (TOURNIQUET CUFF)
CUFF TOURN SGL QUICK 30 (TOURNIQUET CUFF) ×2
CUFF TRNQT CYL 24X4X16.5-23 (TOURNIQUET CUFF) IMPLANT
CUFF TRNQT CYL 30X4X21-28X (TOURNIQUET CUFF) ×1 IMPLANT
DRAPE 3/4 80X56 (DRAPES) ×3 IMPLANT
DRSG AQUACEL AG ADV 3.5X14 (GAUZE/BANDAGES/DRESSINGS) ×3 IMPLANT
DRSG DERMACEA 8X12 NADH (GAUZE/BANDAGES/DRESSINGS) IMPLANT
DRSG OPSITE POSTOP 4X14 (GAUZE/BANDAGES/DRESSINGS) IMPLANT
DRSG TEGADERM 4X4.75 (GAUZE/BANDAGES/DRESSINGS) IMPLANT
DURAPREP 26ML APPLICATOR (WOUND CARE) ×6 IMPLANT
ELECT REM PT RETURN 9FT ADLT (ELECTROSURGICAL) ×3
ELECTRODE REM PT RTRN 9FT ADLT (ELECTROSURGICAL) ×1 IMPLANT
EX-PIN ORTHOLOCK NAV 4X150 (PIN) ×6 IMPLANT
GLOVE BIO SURGEON STRL SZ7.5 (GLOVE) ×6 IMPLANT
GLOVE BIOGEL M STRL SZ7.5 (GLOVE) ×6 IMPLANT
GLOVE BIOGEL PI IND STRL 7.5 (GLOVE) ×1 IMPLANT
GLOVE BIOGEL PI INDICATOR 7.5 (GLOVE) ×2
GLOVE INDICATOR 8.0 STRL GRN (GLOVE) ×3 IMPLANT
GOWN STRL REUS W/ TWL LRG LVL3 (GOWN DISPOSABLE) ×2 IMPLANT
GOWN STRL REUS W/ TWL XL LVL3 (GOWN DISPOSABLE) ×1 IMPLANT
GOWN STRL REUS W/TWL LRG LVL3 (GOWN DISPOSABLE) ×4
GOWN STRL REUS W/TWL XL LVL3 (GOWN DISPOSABLE) ×2
HEMOVAC 400CC 10FR (MISCELLANEOUS) ×3 IMPLANT
HOLDER FOLEY CATH W/STRAP (MISCELLANEOUS) ×3 IMPLANT
HOOD PEEL AWAY FLYTE STAYCOOL (MISCELLANEOUS) ×6 IMPLANT
KIT TURNOVER KIT A (KITS) ×3 IMPLANT
KNIFE SCULPS 14X20 (INSTRUMENTS) ×3 IMPLANT
LABEL OR SOLS (LABEL) ×3 IMPLANT
MANIFOLD NEPTUNE II (INSTRUMENTS) ×3 IMPLANT
NDL SAFETY ECLIPSE 18X1.5 (NEEDLE) ×1 IMPLANT
NEEDLE HYPO 18GX1.5 SHARP (NEEDLE) ×2
NEEDLE SPNL 20GX3.5 QUINCKE YW (NEEDLE) ×6 IMPLANT
NS IRRIG 500ML POUR BTL (IV SOLUTION) ×3 IMPLANT
PACK TOTAL KNEE (MISCELLANEOUS) ×3 IMPLANT
PAD WRAPON POLAR KNEE (MISCELLANEOUS) ×1 IMPLANT
PENCIL SMOKE EVACUATOR COATED (MISCELLANEOUS) ×3 IMPLANT
PENCIL SMOKE ULTRAEVAC 22 CON (MISCELLANEOUS) ×3 IMPLANT
PIN DRILL QUICK PACK ×3 IMPLANT
PIN FIXATION 1/8DIA X 3INL (PIN) ×9 IMPLANT
PULSAVAC PLUS IRRIG FAN TIP (DISPOSABLE) ×3
SOL .9 NS 3000ML IRR  AL (IV SOLUTION) ×2
SOL .9 NS 3000ML IRR UROMATIC (IV SOLUTION) ×1 IMPLANT
SOL PREP PVP 2OZ (MISCELLANEOUS) ×3
SOLUTION PREP PVP 2OZ (MISCELLANEOUS) ×1 IMPLANT
SPONGE DRAIN TRACH 4X4 STRL 2S (GAUZE/BANDAGES/DRESSINGS) IMPLANT
STAPLER SKIN PROX 35W (STAPLE) ×3 IMPLANT
STOCKINETTE IMPERV 14X48 (MISCELLANEOUS) ×3 IMPLANT
STRAP TIBIA SHORT (MISCELLANEOUS) ×3 IMPLANT
SUCTION FRAZIER HANDLE 10FR (MISCELLANEOUS) ×2
SUCTION TUBE FRAZIER 10FR DISP (MISCELLANEOUS) ×1 IMPLANT
SUT VIC AB 0 CT1 36 (SUTURE) ×6 IMPLANT
SUT VIC AB 1 CT1 36 (SUTURE) ×6 IMPLANT
SUT VIC AB 2-0 CT2 27 (SUTURE) ×3 IMPLANT
SYR 20ML LL LF (SYRINGE) ×3 IMPLANT
SYR 30ML LL (SYRINGE) ×6 IMPLANT
TIBIA ATTUNE KNEE SYS BASE SZ6 (Knees) ×3 IMPLANT
TIP FAN IRRIG PULSAVAC PLUS (DISPOSABLE) ×1 IMPLANT
TOWEL OR 17X26 4PK STRL BLUE (TOWEL DISPOSABLE) ×3 IMPLANT
TOWER CARTRIDGE SMART MIX (DISPOSABLE) ×3 IMPLANT
TRAY FOLEY MTR SLVR 16FR STAT (SET/KITS/TRAYS/PACK) ×3 IMPLANT
WRAPON POLAR PAD KNEE (MISCELLANEOUS) ×3

## 2019-03-20 NOTE — Discharge Instructions (Addendum)
Instructions after Total Knee Replacement   James P. Hooten, Jr., M.D.     Dept. of Orthopaedics & Sports Medicine  [facility]  [address]  [place], [place]  [address]  Phone: [phone]   Fax: [phone]    DIET: . Drink plenty of non-alcoholic fluids. . Resume your normal diet. Include foods high in fiber.  ACTIVITY:  . You may use crutches or a walker with weight-bearing as tolerated, unless instructed otherwise. . You may be weaned off of the walker or crutches by your Physical Therapist.  . Do NOT place pillows under the knee. Anything placed under the knee could limit your ability to straighten the knee.   . Continue doing gentle exercises. Exercising will reduce the pain and swelling, increase motion, and prevent muscle weakness.   . Please continue to use the TED compression stockings for 6 weeks. You may remove the stockings at night, but should reapply them in the morning. . Do not drive or operate any equipment until instructed.  WOUND CARE:  . Continue to use the PolarCare or ice packs periodically to reduce pain and swelling. . You may bathe or shower after the staples are removed at the first office visit following surgery.  MEDICATIONS: . You may resume your regular medications. . Please take the pain medication as prescribed on the medication. . Do not take pain medication on an empty stomach. . You have been given a prescription for a blood thinner (Lovenox or Coumadin). Please take the medication as instructed. (NOTE: After completing a 2 week course of Lovenox, take one Enteric-coated aspirin once a day. This along with elevation will help reduce the possibility of phlebitis in your operated leg.) . Do not drive or drink alcoholic beverages when taking pain medications.  CALL THE OFFICE FOR: . Temperature above 101 degrees . Excessive bleeding or drainage on the dressing. . Excessive swelling, coldness, or paleness of the toes. . Persistent  nausea and vomiting.  FOLLOW-UP:  . You should have an appointment to return to the office in 10-14 days after surgery. . Arrangements have been made for continuation of Physical Therapy (either home therapy or outpatient therapy).     [facility] Department Directory         [email or web address]       [email or web address]          Cardiology  Appointments: [place] - [phone] [place] - [phone]  Endocrinology  Appointments: [place] - [phone] [place] - [phone]  Gastroenterology  Appointments: [place] - [phone] [place] - [phone]        General Surgery   Appointments: [place] - [phone]  Internal Medicine/Family Medicine  Appointments: [place] - [phone] [place] - [phone] [place] - [phone]  Metabolic and Weigh Loss Surgery  Appointments: [place] - [phone]        Neurology  Appointments: [place] - [phone] [place] - [phone]  Neurosurgery  Appointments: [place] - [phone]  Obstetrics & Gynecology  Appointments: [place] - [phone] [place] - [phone]        Pediatrics  Appointments: [place] - [phone] [place] - [phone]  Physiatry  Appointments: [place] -[phone]  Physical Therapy  Appointments: [place] - [phone] [place] - [phone]        Podiatry  Appointments: [place] - [phone] [place] - [phone]  Pulmonology  Appointments: [place] - [phone]  Rheumatology  Appointments:  - [phone]         Location: [facility]  [address] , [place]  [address]  [place] Location: [facility] [address]   [place], [place]  [address]  [place] Location: [facility] [address] [place],   [id number]     AMBULATORY SURGERY  DISCHARGE INSTRUCTIONS   1) The drugs that you were given will stay in your system until tomorrow so for the  next 24 hours you should not: A) Drive an automobile B) Make any legal decisions C) Drink any alcoholic beverage  2) You may resume regular meals tomorrow.  Today it is better to start with liquids and gradually work up to solid foods. You may eat anything you prefer, but it is better to start with liquids, then soup and crackers, and gradually work up to solid foods.  3) Please notify your doctor immediately if you have any unusual bleeding, trouble breathing, redness and pain at the surgery site, drainage, fever, or pain not relieved by medication.   Please contact your physician with any problems or Same Day Surgery at [phone], Monday through Friday 6 am to 4 pm, or [place] at [facility] number at [phone].

## 2019-03-20 NOTE — Anesthesia Procedure Notes (Addendum)
Spinal  Patient location during procedure: OR Start time: 03/20/2019 7:27 AM End time: 03/20/2019 7:31 AM Staffing Performed: other anesthesia staff  Anesthesiologist: Piscitello, Precious Haws, MD Resident/CRNA: Allean Found, CRNA Other anesthesia staff: Harlow Ohms, RN Preanesthetic Checklist Completed: patient identified, IV checked, site marked, risks and benefits discussed, surgical consent, monitors and equipment checked, pre-op evaluation and timeout performed Spinal Block Patient position: sitting Prep: Betadine Patient monitoring: heart rate, continuous pulse ox, blood pressure and cardiac monitor Approach: midline Location: L4-5 Injection technique: single-shot Needle Needle type: [place] and Introducer  Needle gauge: 24 G Needle length: 9 cm Needle insertion depth: 9 cm Additional Notes Negative paresthesia. Negative blood return. Positive free-flowing CSF. Expiration date of kit checked and confirmed. Patient tolerated procedure well, without complications.

## 2019-03-20 NOTE — Evaluation (Addendum)
Physical Therapy Evaluation Patient Details Name: Samuel [name] MRN: [id number] DOB: [birth year] Today's Date: 03/20/2019   History of Present Illness  69yo male POD0 s/p L TKA, WBAT (03/20/19)  Clinical Impression  Upon evaluation, patient alert and oriented; follows commands and demonstrates good effort with all mobility tasks.  Pain controlled, rated 5/10; additional meds administered per RN during session.  Fair/good post-op strength (3-/5) and ROM (5-68 degrees) to L LE.  Good quad activation noted; able to complete SLR without difficulty, good control.  Able to complete bed mobility with sup; sit/stand, basic transfers and gait (100') with RW, cga/min assist; stairs x4 with RW (backwards), cga/min assist.  Demonstrates reciprocal stepping pattern with good step height/length; good stance time/WBing and overall L knee stability. Mild forward trunk flexion, improved with intermittent verbal cuingEducated patient/wife in mobility status/level of assist required, use of polar care, HEP (handout with written/pictorial descriptions), activity progression, assist device use, car transfer technique; patient/wife voiced understanding of all information.  Patient/wife comfortable with all information provided; comfortable with upcoming discharge home. No additional questions/concerns at this time. Would benefit from skilled PT to address above deficits and promote optimal return to PLOF.; Recommend transition to HHPT upon discharge from acute hospitalization.      Follow Up Recommendations Home health PT    Equipment Recommendations  (has RW and BSC)    Recommendations for Other Services       Precautions / Restrictions Precautions Precautions: Fall Restrictions Weight Bearing Restrictions: Yes LLE Weight Bearing: Weight bearing as tolerated      Mobility  Bed Mobility Overal bed mobility: Needs Assistance Bed Mobility: Supine to Sit     Supine to sit: Supervision     General bed  mobility comments: able to mobilize L LE without difficulty  Transfers Overall transfer level: Needs assistance Equipment used: Rolling walker (2 wheeled) Transfers: Sit to/from Stand Sit to Stand: Min assist;Min guard         General transfer comment: cuing for hand/foot placement to maximize safety and pain control  Ambulation/Gait Ambulation/Gait assistance: Min guard;Min assist Gait Distance (Feet): 100 Feet Assistive device: Rolling walker (2 wheeled)       General Gait Details: reciprocal stepping pattern with good step height/length; good stance time/WBing and overall L knee stability. Mild forward trunk flexion, improved with intermittent verbal cuing  Stairs Stairs: Yes Stairs assistance: Min guard Stair Management: Backwards;With walker Number of Stairs: 4 General stair comments: min cuing for technique, walker position; good L knee control and overall stability, confidence with stairs  Wheelchair Mobility    Modified Rankin (Stroke Patients Only)       Balance Overall balance assessment: Needs assistance Sitting-balance support: No upper extremity supported;Feet supported Sitting balance-Leahy Scale: Good     Standing balance support: Bilateral upper extremity supported Standing balance-Leahy Scale: Fair                               Pertinent Vitals/Pain Pain Assessment: 0-10 Pain Score: 5  Pain Location: R knee Pain Descriptors / Indicators: Aching;Guarding;Grimacing Pain Intervention(s): Limited activity within patient's tolerance;Monitored during session;Repositioned;Patient requesting pain meds-RN notified;RN gave pain meds during session    Home Living Family/patient expects to be discharged to:: Private residence Living Arrangements: Spouse/significant other Available Help at Discharge: Family;Available 24 hours/day Type of Home: House Home Access: Stairs to enter Entrance Stairs-Rails: None Entrance Stairs-Number of Steps:  3+1 Home Layout: Multi-level;Able to live on main  level with bedroom/bathroom Home Equipment: Shower seat - built in;Walker - 2 wheels;Bedside commode;Hand held shower head;Adaptive equipment      Prior Function Level of Independence: Independent         Comments: Pt indep with mobility, ADL ("I just take my time" re: LB dressing), no falls, driving; enjoys golfing     Hand Dominance   Dominant Hand: Right    Extremity/Trunk Assessment   Upper Extremity Assessment Upper Extremity Assessment: Overall WFL for tasks assessed    Lower Extremity Assessment Lower Extremity Assessment: (L knee grossly at least 3-/5, limited by pain and post-op soreness; otherwise, grossly WFL.  Full sensation returned) LLE Deficits / Details: s/p TKA, sensation returned, able to perform SLR, expected post-op strength/ROM deficits       Communication   Communication: No difficulties  Cognition Arousal/Alertness: Awake/alert Behavior During Therapy: WFL for tasks assessed/performed Overall Cognitive Status: Within Functional Limits for tasks assessed                                        General Comments      Exercises Total Joint Exercises Goniometric ROM: L knee: 5-68 degrees Other Exercises Other Exercises: Educated patient/wife in use of polar care, HEP (handout with written/pictorial descriptions), activity progression, assist device use, car transfer technique; patient/wife voiced understanding of all information.   Assessment/Plan    PT Assessment Patient needs continued PT services  PT Problem List Decreased strength;Decreased range of motion;Decreased activity tolerance;Decreased balance;Decreased mobility;Decreased knowledge of use of DME;Decreased safety awareness;Decreased knowledge of precautions;Pain;Decreased skin integrity       PT Treatment Interventions DME instruction;Gait training;Stair training;Functional mobility training;Therapeutic  activities;Therapeutic exercise;Balance training;Patient/family education    PT Goals (Current goals can be found in the Care Plan section)  Acute Rehab PT Goals Patient Stated Goal: go home and return to playing golf PT Goal Formulation: With patient/family Time For Goal Achievement: 04/03/19 Potential to Achieve Goals: Good    Frequency BID   Barriers to discharge        Co-evaluation               AM-PAC PT "6 Clicks" Mobility  Outcome Measure Help needed turning from your back to your side while in a flat bed without using bedrails?: None Help needed moving from lying on your back to sitting on the side of a flat bed without using bedrails?: None Help needed moving to and from a bed to a chair (including a wheelchair)?: A Little Help needed standing up from a chair using your arms (e.g., wheelchair or bedside chair)?: A Little Help needed to walk in hospital room?: A Little Help needed climbing 3-5 steps with a railing? : A Little 6 Click Score: 20    End of Session Equipment Utilized During Treatment: Gait belt Activity Tolerance: Patient tolerated treatment well Patient left: in chair;with call bell/phone within reach Nurse Communication: Mobility status PT Visit Diagnosis: Muscle weakness (generalized) (M62.81);Difficulty in walking, not elsewhere classified (R26.2);Pain Pain - Right/Left: Left Pain - part of body: Knee    Time: [id number] PT Time Calculation (min) (ACUTE ONLY): 35 min   Charges:   PT Evaluation $PT Eval Moderate Complexity: 1 Mod PT Treatments $Gait Training: 8-22 mins $Therapeutic Activity: 8-22 mins        Bekka Qian H. Owens Shark, PT, DPT, NCS 03/20/19, 4:02 PM [phone]

## 2019-03-20 NOTE — H&P (Signed)
The patient has been re-examined, and the chart reviewed, and there have been no interval changes to the documented history and physical.    The risks, benefits, and alternatives have been discussed at length. The patient expressed understanding of the risks benefits and agreed with plans for surgical intervention.  Samuel [name] P. Aarin Bluett, Jr. M.D.    

## 2019-03-20 NOTE — Anesthesia Preprocedure Evaluation (Signed)
Anesthesia Evaluation  Patient identified by MRN, date of birth, ID band Patient awake    Reviewed: Allergy & Precautions, H&P , NPO status , Patient's Chart, lab work & pertinent test results  History of Anesthesia Complications Negative for: history of anesthetic complications  Airway Mallampati: III  TM Distance: <3 FB Neck ROM: limited    Dental  (+) Chipped   Pulmonary neg shortness of breath, sleep apnea and Continuous Positive Airway Pressure Ventilation ,           Cardiovascular Exercise Tolerance: Good hypertension, (-) angina(-) Past MI and (-) DOE      Neuro/Psych CVA negative psych ROS   GI/Hepatic Neg liver ROS, GERD  Medicated and Controlled,  Endo/Other  diabetes, Type 2  Renal/GU Renal disease     Musculoskeletal  (+) Arthritis ,   Abdominal   Peds  Hematology negative hematology ROS (+)   Anesthesia Other Findings Past Medical History: No date: Diabetes mellitus without complication (HCC) No date: ED (erectile dysfunction) No date: GERD (gastroesophageal reflux disease) No date: Hyperlipidemia No date: Hypertension No date: OSA (obstructive sleep apnea) No date: Stroke [facility])  Past Surgical History: No date: COLONOSCOPY No date: KNEE ARTHROSCOPY; Left No date: TONSILLECTOMY  BMI    Body Mass Index: 34.53 kg/m      Reproductive/Obstetrics negative OB ROS                             Anesthesia Physical Anesthesia Plan  ASA: III  Anesthesia Plan: Spinal   Post-op Pain Management:    Induction:   PONV Risk Score and Plan:   Airway Management Planned: Natural Airway and Nasal Cannula  Additional Equipment:   Intra-op Plan:   Post-operative Plan:   Informed Consent: I have reviewed the patients History and Physical, chart, labs and discussed the procedure including the risks, benefits and alternatives for the proposed anesthesia with the patient or  authorized representative who has indicated his/her understanding and acceptance.     Dental Advisory Given  Plan Discussed with: Anesthesiologist, CRNA and Surgeon  Anesthesia Plan Comments: (Patient reports no bleeding problems and no anticoagulant use.  Plan for spinal with backup GA  Patient consented for risks of anesthesia including but not limited to:  - adverse reactions to medications - risk of bleeding, infection, nerve damage and headache - risk of failed spinal - damage to teeth, lips or other oral mucosa - sore throat or hoarseness - Damage to heart, brain, lungs or loss of life  Patient voiced understanding.)        Anesthesia Quick Evaluation

## 2019-03-20 NOTE — H&P (Signed)
ORTHOPAEDIC HISTORY & PHYSICALProgress Notes by Gwenlyn Fudge, PA at 03/17/2019 1:15 PM  [place] [facility] Chief Complaint:       Chief Complaint  Patient presents with  . Knee Pain    H & P LEFT KNEE    History of Present Illness:    Samuel [name] is a 69 y.o. male that presents to clinic today for his preoperative history and evaluation.  Patient presents with his wife. The patient is scheduled to undergo a left total knee arthroplasty on 03/20/19 by Dr. Marry Guan. His pain began several years ago.  The pain is located primarily along the medial knee. He reports associated swelling and giving way of the knee, but denies any locking of the knee. Pain is worse with weightbearing and going up and down stairs.  He denies associated numbness or tingling.   The patient's symptoms have progressed to the point that they decrease his quality of life. The patient has previously undergone conservative treatment including NSAIDS and injections to the knee without adequate control of his symptoms.  Patient denies any history of blood clots or significant cardiac history.  Of note, patient did have a stroke 20 years ago at the age of 53.  Patient is a type II diabetic and his last A1c was 6.9.  Past Medical, Surgical, Family, Social History, Allergies, Medications:   Past Medical History:      Past Medical History:  Diagnosis Date  . Allergic state   . Chicken pox   . Depression   . Diabetes mellitus type 2, uncomplicated (CMS-HCC)   . ED (erectile dysfunction)   . GERD (gastroesophageal reflux disease)   . Hyperlipidemia   . Hypertension   . Hypertriglyceridemia   . Sleep apnea     Past Surgical History:       Past Surgical History:  Procedure Laterality Date  . Arthroscopic distal clavicle excision    . ARTHROSCOPIC ROTATOR CUFF REPAIR    . COLONOSCOPY  09/10/2013 [facility]   +TA/Repeat 41yrs/[place]  . COLONOSCOPY   12/04/2018   Diverticulosis/PHx CP/Repeat 76yrs/[place]  . Left knee arthroscopy    . Right shoulder arthroscopic     . SHOULDER ARTHROSCOPY W/ SUBACROMIAL DECOMPRESSION AND DISTAL CLAVICLE EXCISION    . TONSILLECTOMY      Current Medications:  Current Medications        Current Outpatient Medications  Medication Sig Dispense Refill  . loratadine (CLARITIN) 10 mg tablet Take 10 mg by mouth once daily    . omeprazole (PRILOSEC) 20 MG DR capsule Take 20 mg by mouth once daily    . amLODIPine (NORVASC) 5 MG tablet Take 1 tablet (5 mg total) by mouth once daily 90 tablet 3  . aspirin 81 MG chewable tablet Take 81 mg by mouth once daily.    Marland Kitchen lisinopriL-hydrochlorothiazide (ZESTORETIC) 20-25 mg tablet Take 1 tablet by mouth once daily 90 tablet 3  . metFORMIN (GLUCOPHAGE) 500 MG tablet Take 1 tablet (500 mg total) by mouth daily with breakfast 90 tablet 3  . pravastatin (PRAVACHOL) 40 MG tablet Take 1 tablet (40 mg total) by mouth once daily 90 tablet 3   No current facility-administered medications for this visit.       Allergies:      Allergies  Allergen Reactions  . Simvastatin Muscle Pain    Social History:  Social History  Social History        Socioeconomic History  . Marital status:  Married    Spouse name: Noe Gens  . Number of children: 3  . Years of education: 26  . Highest education level: High school graduate  Occupational History  . Occupation: Retired  Scientific laboratory technician  . Financial resource strain: Not on file  . Food insecurity    Worry: Not on file    Inability: Not on file  . Transportation needs    Medical: Not on file    Non-medical: Not on file  Tobacco Use  . Smoking status: Never Smoker  . Smokeless tobacco: Never Used  Substance and Sexual Activity  . Alcohol use: Yes    Alcohol/week: 2.0 standard drinks    Types: 2 Cans of beer per week    Frequency: 2-3 times a week  . Drug use: Defer  . Sexual activity: Yes     Partners: Female  Lifestyle  . Physical activity    Days per week: Not on file    Minutes per session: Not on file  . Stress: Not on file  Relationships  . Social Herbalist on phone: Not on file    Gets together: Not on file    Attends religious service: Not on file    Active member of club or organization: Not on file    Attends meetings of clubs or organizations: Not on file    Relationship status: Not on file  Other Topics Concern  . Not on file  Social History Narrative  . Not on file      Family History:       Family History  Problem Relation Age of Onset  . Diabetes type II Mother   . High blood pressure (Hypertension) Father   . Myocardial Infarction (Heart attack) Father     Review of Systems:   A 10+ ROS was performed, reviewed, and the pertinent orthopaedic findings are documented in the HPI.    Physical Examination:   BP 132/68   Ht 172.7 cm (5\' 8" )   Wt (!) 103.6 kg (228 lb 6.4 oz)   BMI 34.73 kg/m   Patient is a well-developed, well-nourished male in no acute distress. Patient has normal mood and affect. Patient is alert and oriented to person, place, and time.   HEENT: Atraumatic, normocephalic.  Pupils equal and reactive to light.  Extraocular motion intact.  Noninjected sclera.  Cardiovascular: Regular rate and rhythm, with no murmurs, rubs, or gallops.  Distal pulses palpable.  Respiratory: Lungs clear to auscultation bilaterally.   LeftKnee: Soft tissue swelling:mild Effusion:minimal Erythema:none Crepitance:mild Tenderness:medial Alignment:relative varus Mediolateral laxity:medial pseudolaxity Posterior [id number] Patellar tracking:Good tracking without evidence of subluxation or tilt Atrophy:No significantatrophy.   Quadriceps tone was fair to good. Range of motion:0/0/120degrees  Sensation intact over the saphenous, lateral sural cutaneous, superficial fibular, and deep fibular nerve distributions.  Tests Performed/Reviewed:  X-rays  Anteroposterior, lateral, and sunrise views of the left knee were obtained.  Images reveal severe loss of medial and lateral joint space with associated osteophyte formation and subchondral sclerosis of the bone.  Patellofemoral joint reveals loss of joint space with osteophyte formation.  No fractures or dislocations noted.  Impression:      ICD-10-CM  1. Primary osteoarthritis of left knee  M17.12  2. Diabetes mellitus with stage 2 chronic kidney disease (CMS-HCC)  E11.22   N18.2   Plan:   The patient has end-stage degenerative changes of the left knee.  It was explained to the patient that the condition is progressive in nature.  Having failed conservative treatment, the patient has elected to proceed with a total joint arthroplasty.  The patient will undergo a total joint arthroplasty with Dr. Marry Guan.  The risks of surgery, including blood clot and infection, were discussed with the patient.  Measures to reduce these risks, including the use of anticoagulation, perioperative antibiotics, and early ambulation were discussed.  The importance of postoperative physical therapy was discussed with the patient. The patient elects to proceed with surgery. The patient is instructed to stop all blood thinners prior to surgery.  The patient is instructed to call the hospital the day before surgery to learn of the proper arrival time.    Contact our office with any questions or concerns.  Follow up as indicated, or sooner should any new problems arise, if conditions worsen, or if they are otherwise concerned.   Gwenlyn Fudge, PA [facility] [address],  [place] [address] Phone: [phone]  This note was generated in part with voice recognition software and I apologize for any typographical errors that were not detected and corrected.     Electronically signed by Gwenlyn Fudge, [place] on 03/17/2019 6:27 PM

## 2019-03-20 NOTE — Anesthesia Postprocedure Evaluation (Signed)
Anesthesia Post Note  Patient: FADI HILGERT  Procedure(s) Performed: LEFT COMPUTER ASSISTED TOTAL KNEE ARTHROPLASTY (Left Knee)  Patient location during evaluation: PACU Anesthesia Type: Spinal Level of consciousness: oriented and awake and alert Pain management: pain level controlled Vital Signs Assessment: post-procedure vital signs reviewed and stable Respiratory status: spontaneous breathing Cardiovascular status: blood pressure returned to baseline and stable Postop Assessment: no headache, no backache, no apparent nausea or vomiting and patient able to bend at knees Anesthetic complications: no     Last Vitals:  Vitals:   03/20/19 1316 03/20/19 1432  BP: 110/72 130/88  Pulse: 74 66  Resp: 20 18  Temp: (!) 36.3 C   SpO2: 95% 95%    Last Pain:  Vitals:   03/20/19 1517  TempSrc:   PainSc: 5                  Precious Haws Nashaun Hillmer

## 2019-03-20 NOTE — Op Note (Signed)
OPERATIVE NOTE  DATE OF SURGERY:  03/20/2019  PATIENT NAME:  KENDRYCK GALANTE   DOB: 12/05/1950  MRN: [id number]  PRE-OPERATIVE DIAGNOSIS: Degenerative arthrosis of the left knee, primary  POST-OPERATIVE DIAGNOSIS:  Same  PROCEDURE:  Left total knee arthroplasty using computer-assisted navigation  SURGEON:  Marciano Sequin. M.D.  ASSISTANT: Cassell Smiles, PA-C (present and scrubbed throughout the case, critical for assistance with exposure, retraction, instrumentation, and closure)  ANESTHESIA: spinal  ESTIMATED BLOOD LOSS: 50 mL  FLUIDS REPLACED: 1000 mL of crystalloid  TOURNIQUET TIME: 108 minutes  DRAINS: 2 medium Hemovac drains  SOFT TISSUE RELEASES: Anterior cruciate ligament, posterior cruciate ligament, deep medial collateral ligament, patellofemoral ligament, posterolateral corner  IMPLANTS UTILIZED: DePuy Attune size 6 posterior stabilized femoral component (cemented), size 6 rotating platform tibial component (cemented), 38 mm medialized dome patella (cemented), and a 10 mm stabilized rotating platform polyethylene insert.  INDICATIONS FOR SURGERY: SUTTER LOMBARDOZZI is a 69 y.o. year old male with a long history of progressive knee pain. X-rays demonstrated severe degenerative changes in tricompartmental fashion. The patient had not seen any significant improvement despite conservative nonsurgical intervention. After discussion of the risks and benefits of surgical intervention, the patient expressed understanding of the risks benefits and agree with plans for total knee arthroplasty.   The risks, benefits, and alternatives were discussed at length including but not limited to the risks of infection, bleeding, nerve injury, stiffness, blood clots, the need for revision surgery, cardiopulmonary complications, among others, and they were willing to proceed.  PROCEDURE IN DETAIL: The patient was brought into the operating room and, after adequate spinal anesthesia was achieved, a  tourniquet was placed on the patient's upper thigh. The patient's knee and leg were cleaned and prepped with alcohol and DuraPrep and draped in the usual sterile fashion. A "timeout" was performed as per usual protocol. The lower extremity was exsanguinated using an Esmarch, and the tourniquet was inflated to 300 mmHg. An anterior longitudinal incision was made followed by a standard mid vastus approach. The deep fibers of the medial collateral ligament were elevated in a subperiosteal fashion off of the medial flare of the tibia so as to maintain a continuous soft tissue sleeve. The patella was subluxed laterally and the patellofemoral ligament was incised. Inspection of the knee demonstrated severe degenerative changes with full-thickness loss of articular cartilage. Osteophytes were debrided using a rongeur. Anterior and posterior cruciate ligaments were excised. Two 4.0 mm Schanz pins were inserted in the femur and into the tibia for attachment of the array of trackers used for computer-assisted navigation. Hip center was identified using a circumduction technique. Distal landmarks were mapped using the computer. The distal femur and proximal tibia were mapped using the computer. The distal femoral cutting guide was positioned using computer-assisted navigation so as to achieve a 5 distal valgus cut. The femur was sized and it was felt that a size 6 femoral component was appropriate. A size 6 femoral cutting guide was positioned and the anterior cut was performed and verified using the computer. This was followed by completion of the posterior and chamfer cuts. Femoral cutting guide for the central box was then positioned in the center box cut was performed.  Attention was then directed to the proximal tibia. Medial and lateral menisci were excised. The extramedullary tibial cutting guide was positioned using computer-assisted navigation so as to achieve a 0 varus-valgus alignment and 3 posterior slope. The  cut was performed and verified using the computer. The proximal  tibia was sized and it was felt that a size 6 tibial tray was appropriate. Tibial and femoral trials were inserted followed by insertion of an 8 mm polyethylene insert. The knee was felt to be tight laterally.  The trial components were removed and the knee was brought into full extension and distracted using the Moreland retractors.  The posterolateral corner was carefully released using a combination of electrocautery and Metzenbaum scissors.  Trial components were reinserted followed by placement of a 10 mm polyethylene trial.  This allowed for excellent mediolateral soft tissue balancing both in flexion and in full extension. Finally, the patella was cut and prepared so as to accommodate a 38 mm medialized dome patella. A patella trial was placed and the knee was placed through a range of motion with excellent patellar tracking appreciated. The femoral trial was removed after debridement of posterior osteophytes. The central post-hole for the tibial component was reamed followed by insertion of a keel punch. Tibial trials were then removed. Cut surfaces of bone were irrigated with copious amounts of normal saline with antibiotic solution using pulsatile lavage and then suctioned dry. Polymethylmethacrylate cement with gentamicin was prepared in the usual fashion using a vacuum mixer. Cement was applied to the cut surface of the proximal tibia as well as along the undersurface of a size 6 rotating platform tibial component. Tibial component was positioned and impacted into place. Excess cement was removed using [employer] fast streamer. Cement was then applied to the cut surfaces of the femur as well as along the posterior flanges of the size 6 femoral component. The femoral component was positioned and impacted into place. Excess cement was removed using [employer] fast streamer. A 11mm polyethylene trial was inserted and the knee was brought into full extension with  steady axial compression applied. Finally, cement was applied to the backside of a 38 mm medialized dome patella and the patellar component was positioned and patellar clamp applied. Excess cement was removed using [employer] fast streamer. After adequate curing of the cement, the tourniquet was deflated after a total tourniquet time of 108 minutes. Hemostasis was achieved using electrocautery. The knee was irrigated with copious amounts of normal saline with antibiotic solution using pulsatile lavage and then suctioned dry. 20 mL of 1.3% Exparel and 60 mL of 0.25% Marcaine in 40 mL of normal saline was injected along the posterior capsule, medial and lateral gutters, and along the arthrotomy site. A 10 mm stabilized rotating platform polyethylene insert was inserted and the knee was placed through a range of motion with excellent mediolateral soft tissue balancing appreciated and excellent patellar tracking noted. 2 medium drains were placed in the wound bed and brought out through separate stab incisions. The medial parapatellar portion of the incision was reapproximated using interrupted sutures of #1 Vicryl. Subcutaneous tissue was approximated in layers using first #0 Vicryl followed #2-0 Vicryl. The skin was approximated with skin staples. A sterile dressing was applied.  The patient tolerated the procedure well and was transported to the recovery room in stable condition.    Tarig Zimmers P. Holley Bouche., M.D.

## 2019-03-20 NOTE — Transfer of Care (Signed)
Immediate Anesthesia Transfer of Care Note  Patient: Samuel [name]  Procedure(s) Performed: LEFT COMPUTER ASSISTED TOTAL KNEE ARTHROPLASTY (Left Knee)  Patient Location: PACU  Anesthesia Type:MAC  Level of Consciousness: awake, alert  and oriented  Airway & Oxygen Therapy: Patient Spontanous Breathing and Patient connected to face mask oxygen  Post-op Assessment: Report given to RN and Post -op Vital signs reviewed and stable  Post vital signs: Reviewed and stable  Last Vitals:  Vitals Value Taken Time  BP 113/70 03/20/19 1148  Temp 36.7 C 03/20/19 1148  Pulse 78 03/20/19 1148  Resp 16 03/20/19 1148  SpO2 98 % 03/20/19 1148  Vitals shown include unvalidated device data.  Last Pain:  Vitals:   03/20/19 0612  TempSrc: Temporal  PainSc: 0-No pain         Complications: No apparent anesthesia complications

## 2019-03-20 NOTE — Addendum Note (Signed)
Addendum  created 03/20/19 1525 by Leaha Cuervo, Precious Haws, MD   Attestation recorded in [place], [place] filed

## 2019-03-20 NOTE — Evaluation (Signed)
Occupational Therapy Evaluation Patient Details Name: Samuel [name] MRN: [id number] DOB: 1950/06/24 Today's Date: 03/20/2019    History of Present Illness 69yo male POD0 s/p L TKA, WBAT.   Clinical Impression   Pt seen for OT evaluation this date, POD#0 from above surgery. Pt was independent in all ADL prior to surgery and is eager to return to PLOF including golfing with less pain and improved safety and independence. Pt currently requires PRN minimal assist for LB dressing and bathing while in seated position due to limited AROM of L knee. Pt and spouse instructed in polar care mgt, falls prevention strategies, home/routines modifications, car transfers, DME/AE for LB bathing and dressing tasks, RW mgt within bathroom vs kitchen, and compression stocking mgt. Hand out provided to support recall and carryover. Pt/spouse verbalized understanding. Follow up therapy as arranged by MD.      Follow Up Recommendations  No OT follow up;Follow surgeon's recommendation for DC plan and follow-up therapies    Equipment Recommendations  None recommended by OT    Recommendations for Other Services       Precautions / Restrictions Precautions Precautions: Fall Restrictions Weight Bearing Restrictions: Yes LLE Weight Bearing: Weight bearing as tolerated      Mobility Bed Mobility               General bed mobility comments: deferred, pending 2nd bolus per RN  Transfers                 General transfer comment: deferred, pending 2nd bolus per RN    Balance                                           ADL either performed or assessed with clinical judgement   ADL Overall ADL's : Needs assistance/impaired                                       General ADL Comments: PRN Min A for LB ADL, spouse able to provide needed level of assist     Vision Baseline Vision/History: Wears glasses Wears Glasses: At all times Patient Visual Report: No  change from baseline Vision Assessment?: No apparent visual deficits     Perception     Praxis      Pertinent Vitals/Pain Pain Assessment: No/denies pain     Hand Dominance Right   Extremity/Trunk Assessment Upper Extremity Assessment Upper Extremity Assessment: Overall WFL for tasks assessed   Lower Extremity Assessment Lower Extremity Assessment: Overall WFL for tasks assessed;LLE deficits/detail LLE Deficits / Details: s/p TKA, sensation returned, able to perform SLR, expected post-op strength/ROM deficits       Communication Communication Communication: No difficulties   Cognition Arousal/Alertness: Awake/alert Behavior During Therapy: WFL for tasks assessed/performed Overall Cognitive Status: Within Functional Limits for tasks assessed                                     General Comments       Exercises Other Exercises Other Exercises: Pt/spouse instructed in polar care mgt, compression stocking mgt, falls prevention, AE/DME for ADL, home/routines modifications for ADL and IADL, RW mgt with bathroom vs kitchen, and car transfers. Handout provided to support recall and  carryover.   Shoulder Instructions      Home Living Family/patient expects to be discharged to:: Private residence Living Arrangements: Spouse/significant other Available Help at Discharge: Family;Available 24 hours/day Type of Home: House Home Access: Stairs to enter [employer] of Steps: 2+1   Home Layout: Multi-level;Able to live on main level with bedroom/bathroom(1 1/2 level)     Bathroom Shower/Tub: Occupational psychologist: Standard     Home Equipment: Shower seat - built in;Walker - 2 wheels;Bedside commode;Hand held shower head;Adaptive equipment Adaptive Equipment: Reacher        Prior Functioning/Environment Level of Independence: Independent        Comments: Pt indep with mobility, ADL ("I just take my time" re: LB dressing), no falls,  driving        OT Problem List: Decreased strength;Decreased range of motion      OT Treatment/Interventions:      OT Goals(Current goals can be found in the care plan section) Acute Rehab OT Goals Patient Stated Goal: go home and return to playing golf OT Goal Formulation: All assessment and education complete, DC therapy  OT Frequency:     Barriers to D/C:            Co-evaluation              AM-PAC OT "6 Clicks" Daily Activity     Outcome Measure Help from another person eating meals?: None Help from another person taking care of personal grooming?: None Help from another person toileting, which includes using toliet, bedpan, or urinal?: A Little Help from another person bathing (including washing, rinsing, drying)?: A Little Help from another person to put on and taking off regular upper body clothing?: None Help from another person to put on and taking off regular lower body clothing?: A Little 6 Click Score: 21   End of Session    Activity Tolerance: Patient tolerated treatment well Patient left: in bed;with call bell/phone within reach;with nursing/sitter in room;with family/visitor present  OT Visit Diagnosis: Other abnormalities of gait and mobility (R26.89)                Time: 1321-1400 OT Time Calculation (min): 39 min Charges:  OT General Charges $OT Visit: 1 Visit OT Evaluation $OT Eval Low Complexity: 1 Low OT Treatments $Self Care/Home Management : 23-37 mins  Jeni Salles, MPH, MS, OTR/L ascom [phone] 03/20/19, 2:12 PM

## 2019-03-20 NOTE — Progress Notes (Signed)
Pt PACU CBG: 208. Dr. Amie Critchley notified. Acknowledged. No new orders at this time.

## 2019-05-10 DIAGNOSIS — Z96652 Presence of left artificial knee joint: Secondary | ICD-10-CM | POA: Insufficient documentation

## 2019-11-03 ENCOUNTER — Other Ambulatory Visit: Payer: Self-pay

## 2019-11-03 ENCOUNTER — Encounter
Admission: RE | Admit: 2019-11-03 | Discharge: 2019-11-03 | Disposition: A | Discharge status: Home / Self Care | Payer: Medicare Other | Source: Ambulatory Visit | Attending: Orthopedic Surgery | Admitting: Orthopedic Surgery

## 2019-11-03 DIAGNOSIS — Z01818 Encounter for other preprocedural examination: Secondary | ICD-10-CM | POA: Insufficient documentation

## 2019-11-03 HISTORY — DX: Malignant (primary) neoplasm, unspecified: C80.1

## 2019-11-03 HISTORY — DX: Anxiety disorder, unspecified: F41.9

## 2019-11-03 HISTORY — DX: Unspecified osteoarthritis, unspecified site: M19.90

## 2019-11-03 LAB — COMPREHENSIVE METABOLIC PANEL
ALT: 22 U/L (ref 0–44)
AST: 19 U/L (ref 15–41)
Albumin: 4.3 g/dL (ref 3.5–5.0)
Alkaline Phosphatase: 47 U/L (ref 38–126)
Anion gap: 10 (ref 5–15)
BUN: 23 mg/dL (ref 8–23)
CO2: 25 mmol/L (ref 22–32)
Calcium: 9.7 mg/dL (ref 8.9–10.3)
Chloride: 99 mmol/L (ref 98–111)
Creatinine, Ser: 1.2 mg/dL (ref 0.61–1.24)
GFR calc Af Amer: >60 mL/min (ref >60)
GFR calc non Af Amer: >60 mL/min (ref >60)
Glucose, Bld: 109 mg/dL — ABNORMAL HIGH (ref 70–99)
Potassium: 4 mmol/L (ref 3.5–5.1)
Sodium: 134 mmol/L — ABNORMAL LOW (ref 135–145)
Total Bilirubin: 0.9 mg/dL (ref 0.3–1.2)
Total Protein: 7.5 g/dL (ref 6.5–8.1)

## 2019-11-03 LAB — URINALYSIS, ROUTINE W REFLEX MICROSCOPIC
Bilirubin Urine: NEGATIVE
Glucose, UA: NEGATIVE mg/dL
Hgb urine dipstick: NEGATIVE
Ketones, ur: NEGATIVE mg/dL
Leukocytes,Ua: NEGATIVE
Nitrite: NEGATIVE
Protein, ur: NEGATIVE mg/dL
Specific Gravity, Urine: 1.016 (ref 1.005–1.030)
pH: 6 (ref 5.0–8.0)

## 2019-11-03 LAB — CBC
HCT: 41.5 % (ref 39.0–52.0)
Hemoglobin: 14.8 g/dL (ref 13.0–17.0)
MCH: 31.4 pg (ref 26.0–34.0)
MCHC: 35.7 g/dL (ref 30.0–36.0)
MCV: 88.1 fL (ref 80.0–100.0)
Platelets: 197 10*3/uL (ref 150–400)
RBC: 4.71 MIL/uL (ref 4.22–5.81)
RDW: 13.8 % (ref 11.5–15.5)
WBC: 9.5 10*3/uL (ref 4.0–10.5)
nRBC: 0 % (ref 0.0–0.2)

## 2019-11-03 LAB — TYPE AND SCREEN
ABO/RH(D): O POS
Antibody Screen: NEGATIVE

## 2019-11-03 LAB — APTT: aPTT: 29 seconds (ref 24–36)

## 2019-11-03 LAB — PROTIME-INR
INR: 1 (ref 0.8–1.2)
Prothrombin Time: 12.5 seconds (ref 11.4–15.2)

## 2019-11-03 LAB — SURGICAL PCR SCREEN
MRSA, PCR: NEGATIVE
Staphylococcus aureus: NEGATIVE

## 2019-11-03 LAB — HEMOGLOBIN A1C
Hgb A1c MFr Bld: 6.6 % — ABNORMAL HIGH (ref 4.8–5.6)
Mean Plasma Glucose: 142.72 mg/dL

## 2019-11-03 LAB — SEDIMENTATION RATE: Sed Rate: 13 mm/hr (ref 0–20)

## 2019-11-03 NOTE — Patient Instructions (Signed)
Your procedure is scheduled on:Mon 9/13 Report to Day Surgery. To find out your arrival time please call [phone] between 1PM - 3PM on Friday 9/10 .  Remember: Instructions that are not followed completely may result in serious medical risk,  up to and including death, or upon the discretion of your surgeon and anesthesiologist your  surgery may need to be rescheduled.     _X__ 1. Do not eat food after midnight the night before your procedure.                 No chewing gum or hard candies. You may drink clear liquids up to 2 hours                 before you are scheduled to arrive for your surgery- DO not drink clear                 liquids within 2 hours of the start of your surgery.                 Clear Liquids include:  water,G2 or                  Gatorade Zero, Black Coffee or Tea (Do not add                 anything to coffee or tea). __x___2.   Complete the G2 provided to you, 2 hours before arrival.   __X__2.  On the morning of surgery brush your teeth with toothpaste and water, you                may rinse your mouth with mouthwash if you wish.  Do not swallow any toothpaste of mouthwash.     _X__ 3.  No Alcohol for 24 hours before or after surgery.   ___ 4.  Do Not Smoke or use e-cigarettes For 24 Hours Prior to Your Surgery.                 Do not use any chewable tobacco products for at least 6 hours prior to                 Surgery.  ___  5.  Do not use any recreational drugs (marijuana, cocaine, heroin, ecstasy, MDMA or other)                For at least one week prior to your surgery.  Combination of these drugs with anesthesia                May have life threatening results.  ____  6.  Bring all medications with you on the day of surgery if instructed.   _x___  7.  Notify your doctor if there is any change in your medical condition      (cold, fever, infections).     Do not wear jewelry,  Do not wear lotions,  You may wear  deodorant. Do not shave 48 hours prior to surgery. Men may shave face and neck. Do not bring valuables to the hospital.    [facility] is not responsible for any belongings or valuables.  Contacts, dentures or bridgework may not be worn into surgery. Leave your suitcase in the car. After surgery it may be brought to your room. For patients admitted to the hospital, discharge time is determined by your treatment team.   Patients discharged the day of surgery will not be allowed to drive home.  Make arrangements for someone to be with you for the first 24 hours of your Same Day Discharge.    Please read over the following fact sheets that you were given:   Incentive spirometry    _x___ Take these medicines the morning of surgery with A SIP OF WATER:    1. amLODipine (NORVASC) 5 MG tablet  2. loratadine (CLARITIN) 10 MG tablet  3. omeprazole (PRILOSEC) 20 MG capsule night before and morning of surgery  4.pravastatin (PRAVACHOL) 40 MG tablet  5.  6.  ____ Fleet Enema (as directed)   _x___ Use CHG Soap (or wipes) as directed  ____ Use Benzoyl Peroxide Gel as instructed  ____ Use inhalers on the day of surgery  __x__ Stop metformin 2 days prior to surgery   Last dose on Friday 9/10    ____ Take 1/2 of usual insulin dose the night before surgery. No insulin the morning          of surgery.   _x___ Stop aspirin today  __x__ Stop Anti-inflammatories naproxen sodium (ALEVE) 220 MG tablet, ibuprofen or aspirin products   May take tylenol   ____ Stop supplements until after surgery.    _x___ Bring C-Pap to the hospital.    If you have any questions regarding your pre-procedure instructions,  Please call Pre-admit Testing at [phone]

## 2019-11-04 LAB — URINE CULTURE
Culture: NO GROWTH
Special Requests: NORMAL

## 2019-11-05 ENCOUNTER — Other Ambulatory Visit
Admission: RE | Admit: 2019-11-05 | Discharge: 2019-11-05 | Disposition: A | Discharge status: Home / Self Care | Payer: Medicare Other | Source: Ambulatory Visit | Attending: Surgery | Admitting: Surgery

## 2019-11-05 ENCOUNTER — Other Ambulatory Visit: Payer: Self-pay

## 2019-11-05 DIAGNOSIS — Z01812 Encounter for preprocedural laboratory examination: Secondary | ICD-10-CM | POA: Diagnosis present

## 2019-11-05 DIAGNOSIS — Z20822 Contact with and (suspected) exposure to covid-19: Secondary | ICD-10-CM | POA: Diagnosis not present

## 2019-11-06 LAB — SARS CORONAVIRUS 2 (TAT 6-24 HRS): SARS Coronavirus 2: NEGATIVE

## 2019-11-08 ENCOUNTER — Encounter: Payer: Self-pay | Admitting: Orthopedic Surgery

## 2019-11-08 NOTE — H&P (Signed)
ORTHOPAEDIC HISTORY & PHYSICAL  Progress Notes Breland Trouten, Florinda Marker., MD - 10/27/2019 4:15 PM EDT Chief Complaint: Chief Complaint  Patient presents with  . Knee Pain  Right knee degenerative arthrosis   Reason for Visit: The patient is a 69 y.o. male who presents today for reevaluation of his right knee. He reports a several year history of right knee pain. He localizes most of the pain along the medial aspect of the knee. He reports some swelling, no locking, and some giving way of the knee. The pain is aggravated by any weight bearing. The patient has not appreciated any significant improvement despite Tylenol, NSAIDs, glucosamine/chondroitin, activity modification, intra-articular corticosteroid injections, and activity modification. He is not using any ambulatory aids. The patient states that the knee pain has progressed to the point that it is significantly interfering with his activities of daily living.  Medications: Current Outpatient Medications  Medication Sig Dispense Refill  . amLODIPine (NORVASC) 5 MG tablet Take 1 tablet (5 mg total) by mouth once daily 90 tablet 3  . aspirin 81 MG chewable tablet Take 81 mg by mouth once daily.  . celecoxib (CELEBREX) 200 MG capsule  . lisinopriL-hydrochlorothiazide (ZESTORETIC) 20-25 mg tablet Take 1 tablet by mouth once daily 90 tablet 3  . loratadine (CLARITIN) 10 mg tablet Take 10 mg by mouth once daily  . metFORMIN (GLUCOPHAGE) 500 MG tablet Take 1 tablet (500 mg total) by mouth 2 (two) times daily with meals 180 tablet 3  . metoclopramide (REGLAN) 10 MG tablet Take 1 tablet (10 mg total) by mouth every 6 (six) hours as needed for up to 10 doses 10 tablet 0  . omeprazole (PRILOSEC) 20 MG DR capsule Take 20 mg by mouth once daily  . ONETOUCH ULTRA BLUE TEST STRIP test strip CHECK BLOOD SUGAR TWICE DAILY. 100 strip 3  . pravastatin (PRAVACHOL) 40 MG tablet Take 1 tablet (40 mg total) by mouth once daily 90 tablet 3   No current  facility-administered medications for this visit.   Allergies: Allergies  Allergen Reactions  . Simvastatin Muscle Pain   Past Medical History: Past Medical History:  Diagnosis Date  . Allergic state  . Chicken pox  . Depression  . Diabetes mellitus type 2, uncomplicated (CMS-HCC)  . ED (erectile dysfunction)  . GERD (gastroesophageal reflux disease)  . Hyperlipidemia  . Hypertension  . Hypertriglyceridemia  . Sleep apnea   Past Surgical History: Past Surgical History:  Procedure Laterality Date  . Arthroscopic distal clavicle excision  . ARTHROSCOPIC ROTATOR CUFF REPAIR  . COLONOSCOPY 09/10/2013 PY[facility]  +TA/Repeat 29yrs/[place]  . COLONOSCOPY 12/04/2018  Diverticulosis/PHx CP/Repeat 37yrs/TKT  . Left knee arthroscopy  . Left total knee arthroplasty using computer-assisted navigation 03/20/2019  Dr Marry Guan  . Right shoulder arthroscopic  . SHOULDER ARTHROSCOPY W/ SUBACROMIAL DECOMPRESSION AND DISTAL CLAVICLE EXCISION  . TONSILLECTOMY   Social History: Social History   Socioeconomic History  . Marital status: Married  Spouse name: Noe Gens  . Number of children: 3  . Years of education: 58  . Highest education level: High school graduate  Occupational History  . Occupation: Retired  Tobacco Use  . Smoking status: Never Smoker  . Smokeless tobacco: Never Used  Substance and Sexual Activity  . Alcohol use: Yes  Alcohol/week: 2.0 standard drinks  Types: 2 Cans of beer per week  . Drug use: Defer  . Sexual activity: Yes  Partners: Female  Other Topics Concern  . Not on file  Social History Narrative  .  Not on file   Social Determinants of Health   Financial Resource Strain:  . Difficulty of Paying Living Expenses:  Food Insecurity:  . Worried About Charity fundraiser in the Last Year:  . Arboriculturist in the Last Year:  Transportation Needs:  . Film/video editor (Medical):  Marland Kitchen Lack of Transportation (Non-Medical):  Physical Activity:  . Days of Exercise  per Week:  . Minutes of Exercise per Session:  Stress:  . Feeling of Stress :  Social Connections:  . Frequency of Communication with Friends and Family:  . Frequency of Social Gatherings with Friends and Family:  . Attends Religious Services:  . Active Member of Clubs or Organizations:  . Attends Archivist Meetings:  Marland Kitchen Marital Status:   Family History: Family History  Problem Relation Age of Onset  . Diabetes type II Mother  . High blood pressure (Hypertension) Father  . Myocardial Infarction (Heart attack) Father   Review of Systems: A comprehensive 14 point ROS was performed, reviewed, and the pertinent orthopaedic findings are documented in the HPI.  Exam BP (!) 142/90  Temp 36.4 C (97.5 F)  Ht 172.7 cm (5\' 8" )  Wt 99 kg (218 lb 3.2 oz)  BMI 33.18 kg/m   General:  Well-developed, well-nourished male seen in no acute distress.  Antalgic gait. Varus thrust to the right knee.  HEENT:  Atraumatic, normocephalic. Pupils are equal and reactive to light. Extraocular motion is intact. Sclera are clear. Oropharynx is clear with moist mucosa.  Neck:  Supple, nontender, and with good ROM. No thyromegaly, adenopathy, JVD, or carotid bruits.  Lungs:  Clear to auscultation bilaterally.  Cardiovascular:  Regular rate and rhythm. Normal S1, S2. No murmur . No appreciable gallops or rubs. Peripheral pulses are palpable. No lower extremity edema. Homan`s test is negative.  Abdomen:  Soft, nontender, nondistended. Bowel sounds are present.  Extremities: Good strength, stability, and range of motion of the upper extremities. Good range of motion of the hips and ankles.  Right Knee: Soft tissue swelling: mild Effusion: minimal Erythema: none Crepitance: mild Tenderness: medial Alignment: relative varus Mediolateral laxity: medial pseudolaxity Posterior sag: negative Patellar tracking: Good tracking without evidence of subluxation or tilt Atrophy: No  significant atrophy.  Quadriceps tone was fair to good. Range of motion: 0/0/118 degrees  Neurologic:  Awake, alert, and oriented.  Sensory function is intact to pinprick and light touch.  Motor strength is judged to be 5/5.  Motor coordination is within normal limits.  No apparent clonus. No tremor.   X-rays: I ordered and interpreted standing AP, lateral, and sunrise radiographs of the right knee that were obtained in the office today. There is narrowing of the medial cartilage space with bone-on-bone articulation and associated varus alignment. Osteophyte formation is noted. Subchondral sclerosis is noted. Degenerative changes to the patellofemoral articulation are noted. No evidence of fracture or dislocation.   Impression: Degenerative arthrosis of the right knee  Plan:  The findings were discussed in detail with the patient. The patient was given informational material on total knee replacement. Conservative treatment options were reviewed with the patient. We discussed the risks and benefits of surgical intervention. The usual perioperative course was also discussed in detail. The patient expressed understanding of the risks and benefits of surgical intervention and would like to proceed with plans for right total knee arthroplasty.  Due to the current hospital restrictions related to the [place] pandemic, only outpatient procedures are currently being allowed. The  patient expressed his understanding and tentatively would like to proceed with planning the procedure as an outpatient surgery.  MEDICAL CLEARANCE: Per anesthesiology. ACTIVITY: As tolerated. WORK STATUS: Not applicable. THERAPY: Preoperative physical therapy evaluation. MEDICATIONS: Requested Prescriptions   No prescriptions requested or ordered in this encounter   FOLLOW-UP: Return for postoperative follow-up.  Consandra Laske P. Holley Bouche., M.D.  This note was generated in part with voice recognition software and I  apologize for any typographical errors that were not detected and corrected.   Electronically signed by Lamar Benes., MD at 11/02/2019 2:10 PM EDT

## 2019-11-09 ENCOUNTER — Other Ambulatory Visit: Payer: Self-pay

## 2019-11-09 ENCOUNTER — Ambulatory Visit
Admission: RE | Admit: 2019-11-09 | Discharge: 2019-11-09 | Disposition: A | Discharge status: Home / Self Care | Payer: Medicare Other | Attending: Orthopedic Surgery | Admitting: Orthopedic Surgery

## 2019-11-09 ENCOUNTER — Encounter: Payer: Self-pay | Admitting: Orthopedic Surgery

## 2019-11-09 ENCOUNTER — Ambulatory Visit: Payer: Medicare Other

## 2019-11-09 ENCOUNTER — Ambulatory Visit: Payer: Medicare Other | Admitting: Urgent Care

## 2019-11-09 ENCOUNTER — Encounter
Admission: RE | Disposition: A | Discharge status: Home / Self Care | Payer: Self-pay | Source: Home / Self Care | Attending: Orthopedic Surgery

## 2019-11-09 DIAGNOSIS — M1711 Unilateral primary osteoarthritis, right knee: Secondary | ICD-10-CM | POA: Insufficient documentation

## 2019-11-09 DIAGNOSIS — Z7984 Long term (current) use of oral hypoglycemic drugs: Secondary | ICD-10-CM | POA: Insufficient documentation

## 2019-11-09 DIAGNOSIS — Z96651 Presence of right artificial knee joint: Secondary | ICD-10-CM

## 2019-11-09 DIAGNOSIS — Z8673 Personal history of transient ischemic attack (TIA), and cerebral infarction without residual deficits: Secondary | ICD-10-CM | POA: Insufficient documentation

## 2019-11-09 DIAGNOSIS — E781 Pure hyperglyceridemia: Secondary | ICD-10-CM | POA: Diagnosis not present

## 2019-11-09 DIAGNOSIS — G4733 Obstructive sleep apnea (adult) (pediatric): Secondary | ICD-10-CM | POA: Insufficient documentation

## 2019-11-09 DIAGNOSIS — Z7982 Long term (current) use of aspirin: Secondary | ICD-10-CM | POA: Diagnosis not present

## 2019-11-09 DIAGNOSIS — Z833 Family history of diabetes mellitus: Secondary | ICD-10-CM | POA: Diagnosis not present

## 2019-11-09 DIAGNOSIS — I10 Essential (primary) hypertension: Secondary | ICD-10-CM | POA: Insufficient documentation

## 2019-11-09 DIAGNOSIS — F419 Anxiety disorder, unspecified: Secondary | ICD-10-CM | POA: Diagnosis not present

## 2019-11-09 DIAGNOSIS — E119 Type 2 diabetes mellitus without complications: Secondary | ICD-10-CM | POA: Insufficient documentation

## 2019-11-09 DIAGNOSIS — Z8249 Family history of ischemic heart disease and other diseases of the circulatory system: Secondary | ICD-10-CM | POA: Insufficient documentation

## 2019-11-09 DIAGNOSIS — E785 Hyperlipidemia, unspecified: Secondary | ICD-10-CM | POA: Diagnosis not present

## 2019-11-09 DIAGNOSIS — K219 Gastro-esophageal reflux disease without esophagitis: Secondary | ICD-10-CM | POA: Diagnosis not present

## 2019-11-09 DIAGNOSIS — Z79899 Other long term (current) drug therapy: Secondary | ICD-10-CM | POA: Diagnosis not present

## 2019-11-09 DIAGNOSIS — Z96659 Presence of unspecified artificial knee joint: Secondary | ICD-10-CM

## 2019-11-09 HISTORY — PX: KNEE ARTHROPLASTY: SHX992

## 2019-11-09 LAB — GLUCOSE, CAPILLARY
Glucose-Capillary: 120 mg/dL — ABNORMAL HIGH (ref 70–99)
Glucose-Capillary: 194 mg/dL — ABNORMAL HIGH (ref 70–99)

## 2019-11-09 SURGERY — ARTHROPLASTY, KNEE, TOTAL, USING IMAGELESS COMPUTER-ASSISTED NAVIGATION
Anesthesia: General | Site: Knee | Laterality: Right

## 2019-11-09 MED ORDER — ACETAMINOPHEN 10 MG/ML IV SOLN
INTRAVENOUS | Status: AC
  Filled 2019-11-09: qty 100

## 2019-11-09 MED ORDER — CELECOXIB 200 MG PO CAPS
ORAL_CAPSULE | ORAL | Status: AC
  Administered 2019-11-09: 400 mg via ORAL
  Filled 2019-11-09: qty 2

## 2019-11-09 MED ORDER — ONDANSETRON HCL 4 MG PO TABS: 4.0000 mg | ORAL_TABLET | Freq: Four times a day (QID) | ORAL | Status: DC | PRN

## 2019-11-09 MED ORDER — LIDOCAINE HCL (CARDIAC) PF 100 MG/5ML IV SOSY
PREFILLED_SYRINGE | INTRAVENOUS | Status: DC | PRN
  Administered 2019-11-09 (×4): 2 mL via INTRAVENOUS

## 2019-11-09 MED ORDER — ENOXAPARIN SODIUM 40 MG/0.4ML ~~LOC~~ SOLN: 40.0000 mg | SUBCUTANEOUS | 0 refills | Status: DC

## 2019-11-09 MED ORDER — ALUM & MAG HYDROXIDE-SIMETH 200-200-20 MG/5ML PO SUSP: 30.0000 mL | ORAL | Status: DC | PRN

## 2019-11-09 MED ORDER — ONDANSETRON HCL 4 MG/2ML IJ SOLN: 4.0000 mg | Freq: Once | INTRAMUSCULAR | Status: DC | PRN

## 2019-11-09 MED ORDER — ACETAMINOPHEN 10 MG/ML IV SOLN
INTRAVENOUS | Status: DC | PRN
  Administered 2019-11-09: 1000 mg via INTRAVENOUS

## 2019-11-09 MED ORDER — GABAPENTIN 300 MG PO CAPS
ORAL_CAPSULE | ORAL | Status: AC
  Administered 2019-11-09: 300 mg via ORAL
  Filled 2019-11-09: qty 1

## 2019-11-09 MED ORDER — DEXMEDETOMIDINE HCL 200 MCG/2ML IV SOLN
INTRAVENOUS | Status: DC | PRN
  Administered 2019-11-09: 4 ug via INTRAVENOUS
  Administered 2019-11-09 (×2): 8 ug via INTRAVENOUS

## 2019-11-09 MED ORDER — TRAMADOL HCL 50 MG PO TABS: 50.0000 mg | ORAL_TABLET | Freq: Four times a day (QID) | ORAL | 0 refills | Status: DC | PRN

## 2019-11-09 MED ORDER — TRAMADOL HCL 50 MG PO TABS: 50.0000 mg | ORAL_TABLET | ORAL | Status: DC | PRN

## 2019-11-09 MED ORDER — DEXAMETHASONE SODIUM PHOSPHATE 10 MG/ML IJ SOLN
INTRAMUSCULAR | Status: AC
  Administered 2019-11-09: 8 mg via INTRAVENOUS
  Filled 2019-11-09: qty 1

## 2019-11-09 MED ORDER — PANTOPRAZOLE SODIUM 40 MG PO TBEC
40.0000 mg | DELAYED_RELEASE_TABLET | Freq: Two times a day (BID) | ORAL | Status: DC
  Filled 2019-11-09: qty 1

## 2019-11-09 MED ORDER — ROCURONIUM BROMIDE 10 MG/ML (PF) SYRINGE
PREFILLED_SYRINGE | INTRAVENOUS | Status: AC
  Filled 2019-11-09: qty 10

## 2019-11-09 MED ORDER — OXYCODONE HCL 5 MG PO TABS: 5.0000 mg | ORAL_TABLET | ORAL | 0 refills | Status: DC | PRN

## 2019-11-09 MED ORDER — BUPIVACAINE HCL (PF) 0.25 % IJ SOLN
INTRAMUSCULAR | Status: DC | PRN
Start: 2019-11-09 — End: 2019-11-09
  Administered 2019-11-09: 60 mL

## 2019-11-09 MED ORDER — CHLORHEXIDINE GLUCONATE 4 % EX LIQD
60.0000 mL | Freq: Once | CUTANEOUS | Status: AC
  Administered 2019-11-09: 4 via TOPICAL

## 2019-11-09 MED ORDER — MENTHOL 3 MG MT LOZG
1.0000 | LOZENGE | OROMUCOSAL | Status: DC | PRN
  Filled 2019-11-09: qty 9

## 2019-11-09 MED ORDER — SUCCINYLCHOLINE CHLORIDE 20 MG/ML IJ SOLN
INTRAMUSCULAR | Status: DC | PRN
  Administered 2019-11-09: 120 mg via INTRAVENOUS

## 2019-11-09 MED ORDER — PHENOL 1.4 % MT LIQD
1.0000 | OROMUCOSAL | Status: DC | PRN
  Filled 2019-11-09: qty 177

## 2019-11-09 MED ORDER — ROCURONIUM BROMIDE 100 MG/10ML IV SOLN
INTRAVENOUS | Status: DC | PRN
  Administered 2019-11-09: 50 mg via INTRAVENOUS
  Administered 2019-11-09: 20 mg via INTRAVENOUS
  Administered 2019-11-09: 10 mg via INTRAVENOUS
  Administered 2019-11-09 (×2): 20 mg via INTRAVENOUS

## 2019-11-09 MED ORDER — FENTANYL CITRATE (PF) 100 MCG/2ML IJ SOLN: 25.0000 ug | INTRAMUSCULAR | Status: DC | PRN

## 2019-11-09 MED ORDER — SUGAMMADEX SODIUM 200 MG/2ML IV SOLN
INTRAVENOUS | Status: DC | PRN
  Administered 2019-11-09: 395.6 mg via INTRAVENOUS

## 2019-11-09 MED ORDER — METOCLOPRAMIDE HCL 10 MG PO TABS: 5.0000 mg | ORAL_TABLET | Freq: Three times a day (TID) | ORAL | Status: DC | PRN

## 2019-11-09 MED ORDER — CEFAZOLIN SODIUM-DEXTROSE 2-4 GM/100ML-% IV SOLN
2.0000 g | INTRAVENOUS | Status: AC
  Administered 2019-11-09: 2 g via INTRAVENOUS

## 2019-11-09 MED ORDER — CEFAZOLIN SODIUM-DEXTROSE 1-4 GM/50ML-% IV SOLN
1.0000 g | Freq: Four times a day (QID) | INTRAVENOUS | Status: DC
  Administered 2019-11-09: 1 g via INTRAVENOUS

## 2019-11-09 MED ORDER — DIPHENHYDRAMINE HCL 12.5 MG/5ML PO ELIX
12.5000 mg | ORAL_SOLUTION | ORAL | Status: DC | PRN
  Filled 2019-11-09: qty 10

## 2019-11-09 MED ORDER — FENTANYL CITRATE (PF) 100 MCG/2ML IJ SOLN
INTRAMUSCULAR | Status: DC | PRN
Start: 2019-11-09 — End: 2019-11-09
  Administered 2019-11-09 (×2): 50 ug via INTRAVENOUS

## 2019-11-09 MED ORDER — SODIUM CHLORIDE 0.9 % IV SOLN
INTRAVENOUS | Status: DC | PRN
  Administered 2019-11-09: 25 ug/min via INTRAVENOUS
  Administered 2019-11-09: 100 ug via INTRAVENOUS

## 2019-11-09 MED ORDER — BISACODYL 10 MG RE SUPP
10.0000 mg | Freq: Every day | RECTAL | Status: DC | PRN
  Filled 2019-11-09: qty 1

## 2019-11-09 MED ORDER — NEOMYCIN-POLYMYXIN B GU 40-200000 IR SOLN
Status: DC | PRN
Start: 2019-11-09 — End: 2019-11-09
  Administered 2019-11-09: 16 mL

## 2019-11-09 MED ORDER — KETAMINE HCL 50 MG/ML IJ SOLN
INTRAMUSCULAR | Status: DC | PRN
  Administered 2019-11-09: 50 mg via INTRAMUSCULAR

## 2019-11-09 MED ORDER — ENOXAPARIN SODIUM 40 MG/0.4ML ~~LOC~~ SOLN: 40.0000 mg | SUBCUTANEOUS | Status: DC

## 2019-11-09 MED ORDER — KETAMINE HCL 50 MG/ML IJ SOLN
INTRAMUSCULAR | Status: AC
  Filled 2019-11-09: qty 10

## 2019-11-09 MED ORDER — DEXMEDETOMIDINE (PRECEDEX) IN NS 20 MCG/5ML (4 MCG/ML) IV SYRINGE
PREFILLED_SYRINGE | INTRAVENOUS | Status: AC
  Filled 2019-11-09: qty 5

## 2019-11-09 MED ORDER — DEXAMETHASONE SODIUM PHOSPHATE 10 MG/ML IJ SOLN: 8.0000 mg | Freq: Once | INTRAMUSCULAR | Status: AC

## 2019-11-09 MED ORDER — CHLORHEXIDINE GLUCONATE 0.12 % MT SOLN
OROMUCOSAL | Status: AC
  Administered 2019-11-09: 15 mL
  Filled 2019-11-09: qty 15

## 2019-11-09 MED ORDER — CEFAZOLIN SODIUM-DEXTROSE 2-4 GM/100ML-% IV SOLN
INTRAVENOUS | Status: AC
  Filled 2019-11-09: qty 100

## 2019-11-09 MED ORDER — FERROUS SULFATE 325 (65 FE) MG PO TABS
325.0000 mg | ORAL_TABLET | Freq: Two times a day (BID) | ORAL | Status: DC
  Filled 2019-11-09: qty 1

## 2019-11-09 MED ORDER — ACETAMINOPHEN 325 MG PO TABS: 325.0000 mg | ORAL_TABLET | Freq: Four times a day (QID) | ORAL | Status: DC | PRN

## 2019-11-09 MED ORDER — LACTATED RINGERS IV BOLUS
250.0000 mL | Freq: Once | INTRAVENOUS | Status: AC
  Administered 2019-11-09: 250 mL via INTRAVENOUS

## 2019-11-09 MED ORDER — FLEET ENEMA 7-19 GM/118ML RE ENEM: 1.0000 | ENEMA | Freq: Once | RECTAL | Status: DC | PRN

## 2019-11-09 MED ORDER — SODIUM CHLORIDE 0.9 % IV SOLN: INTRAVENOUS | Status: DC

## 2019-11-09 MED ORDER — PROPOFOL 10 MG/ML IV BOLUS
INTRAVENOUS | Status: DC | PRN
  Administered 2019-11-09: 180 mg via INTRAVENOUS

## 2019-11-09 MED ORDER — LABETALOL HCL 5 MG/ML IV SOLN
INTRAVENOUS | Status: DC | PRN
  Administered 2019-11-09 (×2): 2.5 mg via INTRAVENOUS

## 2019-11-09 MED ORDER — CELECOXIB 200 MG PO CAPS: 400.0000 mg | ORAL_CAPSULE | Freq: Once | ORAL | Status: AC

## 2019-11-09 MED ORDER — SEVOFLURANE IN SOLN
RESPIRATORY_TRACT | Status: AC
  Filled 2019-11-09: qty 250

## 2019-11-09 MED ORDER — LACTATED RINGERS IV BOLUS
500.0000 mL | Freq: Once | INTRAVENOUS | Status: AC
  Administered 2019-11-09: 500 mL via INTRAVENOUS

## 2019-11-09 MED ORDER — CEFAZOLIN SODIUM-DEXTROSE 1-4 GM/50ML-% IV SOLN
INTRAVENOUS | Status: AC
  Filled 2019-11-09: qty 50

## 2019-11-09 MED ORDER — METOCLOPRAMIDE HCL 5 MG/ML IJ SOLN: 5.0000 mg | Freq: Three times a day (TID) | INTRAMUSCULAR | Status: DC | PRN

## 2019-11-09 MED ORDER — ENSURE PRE-SURGERY PO LIQD
296.0000 mL | Freq: Once | ORAL | Status: AC
  Administered 2019-11-09: 296 mL via ORAL
  Filled 2019-11-09: qty 296

## 2019-11-09 MED ORDER — ONDANSETRON HCL 4 MG/2ML IJ SOLN: 4.0000 mg | Freq: Four times a day (QID) | INTRAMUSCULAR | Status: DC | PRN

## 2019-11-09 MED ORDER — ONDANSETRON HCL 4 MG/2ML IJ SOLN
INTRAMUSCULAR | Status: DC | PRN
  Administered 2019-11-09: 4 mg via INTRAVENOUS

## 2019-11-09 MED ORDER — ORAL CARE MOUTH RINSE: 15.0000 mL | Freq: Once | OROMUCOSAL | Status: AC

## 2019-11-09 MED ORDER — OXYCODONE HCL 5 MG PO TABS: 5.0000 mg | ORAL_TABLET | ORAL | Status: DC | PRN

## 2019-11-09 MED ORDER — METOCLOPRAMIDE HCL 10 MG PO TABS: 10.0000 mg | ORAL_TABLET | Freq: Three times a day (TID) | ORAL | Status: DC

## 2019-11-09 MED ORDER — HYDROMORPHONE HCL 1 MG/ML IJ SOLN
INTRAMUSCULAR | Status: DC | PRN
  Administered 2019-11-09 (×2): .5 mg via INTRAVENOUS

## 2019-11-09 MED ORDER — TRANEXAMIC ACID-NACL 1000-0.7 MG/100ML-% IV SOLN
1000.0000 mg | INTRAVENOUS | Status: AC
  Administered 2019-11-09: 1000 mg via INTRAVENOUS

## 2019-11-09 MED ORDER — ACETAMINOPHEN 10 MG/ML IV SOLN: 1000.0000 mg | Freq: Four times a day (QID) | INTRAVENOUS | Status: DC

## 2019-11-09 MED ORDER — TRANEXAMIC ACID-NACL 1000-0.7 MG/100ML-% IV SOLN
INTRAVENOUS | Status: AC
  Filled 2019-11-09: qty 100

## 2019-11-09 MED ORDER — GABAPENTIN 300 MG PO CAPS: 300.0000 mg | ORAL_CAPSULE | Freq: Once | ORAL | Status: AC

## 2019-11-09 MED ORDER — SODIUM CHLORIDE 0.9 % IV SOLN
INTRAVENOUS | Status: DC | PRN
  Administered 2019-11-09: 60 mL

## 2019-11-09 MED ORDER — CELECOXIB 200 MG PO CAPS: 200.0000 mg | ORAL_CAPSULE | Freq: Two times a day (BID) | ORAL | 2 refills | Status: AC

## 2019-11-09 MED ORDER — SUCCINYLCHOLINE CHLORIDE 200 MG/10ML IV SOSY
PREFILLED_SYRINGE | INTRAVENOUS | Status: AC
  Filled 2019-11-09: qty 10

## 2019-11-09 MED ORDER — OXYCODONE HCL 5 MG PO TABS: 10.0000 mg | ORAL_TABLET | ORAL | Status: DC | PRN

## 2019-11-09 MED ORDER — HYDROMORPHONE HCL 1 MG/ML IJ SOLN
INTRAMUSCULAR | Status: AC
  Filled 2019-11-09: qty 1

## 2019-11-09 MED ORDER — HYDROMORPHONE HCL 1 MG/ML IJ SOLN: 0.5000 mg | INTRAMUSCULAR | Status: DC | PRN

## 2019-11-09 MED ORDER — ONDANSETRON HCL 4 MG/2ML IJ SOLN
INTRAMUSCULAR | Status: AC
  Filled 2019-11-09: qty 2

## 2019-11-09 MED ORDER — PROPOFOL 500 MG/50ML IV EMUL
INTRAVENOUS | Status: AC
  Filled 2019-11-09: qty 50

## 2019-11-09 MED ORDER — SURGIPHOR WOUND IRRIGATION SYSTEM - OPTIME
TOPICAL | Status: DC | PRN
  Administered 2019-11-09: 450 mL via TOPICAL

## 2019-11-09 MED ORDER — SENNOSIDES-DOCUSATE SODIUM 8.6-50 MG PO TABS
1.0000 | ORAL_TABLET | Freq: Two times a day (BID) | ORAL | Status: DC
  Filled 2019-11-09: qty 1

## 2019-11-09 MED ORDER — OXYCODONE HCL ER 10 MG PO T12A
EXTENDED_RELEASE_TABLET | ORAL | Status: AC
  Administered 2019-11-09: 10 mg
  Filled 2019-11-09: qty 1

## 2019-11-09 MED ORDER — MAGNESIUM HYDROXIDE 400 MG/5ML PO SUSP
30.0000 mL | Freq: Every day | ORAL | Status: DC
  Filled 2019-11-09: qty 30

## 2019-11-09 MED ORDER — TRANEXAMIC ACID-NACL 1000-0.7 MG/100ML-% IV SOLN
1000.0000 mg | Freq: Once | INTRAVENOUS | Status: AC
  Administered 2019-11-09: 1000 mg via INTRAVENOUS

## 2019-11-09 MED ORDER — CHLORHEXIDINE GLUCONATE 0.12 % MT SOLN: 15.0000 mL | Freq: Once | OROMUCOSAL | Status: AC

## 2019-11-09 SURGICAL SUPPLY — 81 items
ATTUNE MED DOME PAT 38 KNEE (Knees) ×1 IMPLANT
ATTUNE MED DOME PAT 38MM KNEE (Knees) ×1 IMPLANT
ATTUNE PS FEM RT SZ 6 CEM KNEE (Femur) ×2 IMPLANT
ATTUNE PSRP INSR SZ6 5 KNEE (Insert) ×1 IMPLANT
ATTUNE PSRP INSR SZ6 5MM KNEE (Insert) ×1 IMPLANT
BASE TIBIA ATTUNE KNEE SYS SZ6 (Knees) IMPLANT
BATTERY INSTRU NAVIGATION (MISCELLANEOUS) ×12 IMPLANT
BLADE SAW 70X12.5 (BLADE) ×3 IMPLANT
BLADE SAW 90X13X1.19 OSCILLAT (BLADE) ×3 IMPLANT
BLADE SAW 90X25X1.19 OSCILLAT (BLADE) ×3 IMPLANT
BONE CEMENT GENTAMICIN (Cement) ×6 IMPLANT
BSPLAT TIB 6 CMNT ROT PLAT STR (Knees) ×1 IMPLANT
BTRY SRG DRVR LF (MISCELLANEOUS) ×4
CANISTER PREVENA 45 (CANNISTER) ×2 IMPLANT
CANISTER SUCT 3000ML PPV (MISCELLANEOUS) ×3 IMPLANT
CEMENT BONE GENTAMICIN 40 (Cement) IMPLANT
COOLER POLAR GLACIER W/PUMP (MISCELLANEOUS) ×3 IMPLANT
COVER WAND RF STERILE (DRAPES) ×3 IMPLANT
CUFF TOURN SGL QUICK 24 (TOURNIQUET CUFF)
CUFF TOURN SGL QUICK 30 (TOURNIQUET CUFF) ×3
CUFF TRNQT CYL 24X4X16.5-23 (TOURNIQUET CUFF) IMPLANT
CUFF TRNQT CYL 30X4X21-28X (TOURNIQUET CUFF) IMPLANT
DRAPE 3/4 80X56 (DRAPES) ×3 IMPLANT
DRSG DERMACEA 8X12 NADH (GAUZE/BANDAGES/DRESSINGS) ×3 IMPLANT
DRSG MEPILEX SACRM 8.7X9.8 (GAUZE/BANDAGES/DRESSINGS) ×3 IMPLANT
DRSG OPSITE POSTOP 4X14 (GAUZE/BANDAGES/DRESSINGS) ×3 IMPLANT
DRSG TEGADERM 4X4.75 (GAUZE/BANDAGES/DRESSINGS) ×3 IMPLANT
DURAPREP 26ML APPLICATOR (WOUND CARE) ×6 IMPLANT
ELECT REM PT RETURN 9FT ADLT (ELECTROSURGICAL) ×3
ELECTRODE REM PT RTRN 9FT ADLT (ELECTROSURGICAL) ×1 IMPLANT
EX-PIN ORTHOLOCK NAV 4X150 (PIN) ×6 IMPLANT
GLOVE BIO SURGEON STRL SZ7.5 (GLOVE) ×6 IMPLANT
GLOVE BIOGEL M STRL SZ7.5 (GLOVE) ×6 IMPLANT
GLOVE BIOGEL PI IND STRL 7.5 (GLOVE) ×1 IMPLANT
GLOVE BIOGEL PI INDICATOR 7.5 (GLOVE) ×2
GLOVE INDICATOR 8.0 STRL GRN (GLOVE) ×3 IMPLANT
GOWN STRL REUS W/ TWL LRG LVL3 (GOWN DISPOSABLE) ×2 IMPLANT
GOWN STRL REUS W/ TWL XL LVL3 (GOWN DISPOSABLE) ×1 IMPLANT
GOWN STRL REUS W/TWL LRG LVL3 (GOWN DISPOSABLE) ×6
GOWN STRL REUS W/TWL XL LVL3 (GOWN DISPOSABLE) ×3
HOLDER FOLEY CATH W/STRAP (MISCELLANEOUS) ×3 IMPLANT
HOOD PEEL AWAY FLYTE STAYCOOL (MISCELLANEOUS) ×6 IMPLANT
IRRIGATION SURGIPHOR STRL (IV SOLUTION) ×3 IMPLANT
KIT PREVENA INCISION MGT20CM45 (CANNISTER) ×2 IMPLANT
KIT TURNOVER KIT A (KITS) ×3 IMPLANT
KNIFE SCULPS 14X20 (INSTRUMENTS) ×3 IMPLANT
LABEL OR SOLS (LABEL) ×3 IMPLANT
MANIFOLD NEPTUNE II (INSTRUMENTS) ×5 IMPLANT
NDL SAFETY ECLIPSE 18X1.5 (NEEDLE) ×1 IMPLANT
NDL SPNL 20GX3.5 QUINCKE YW (NEEDLE) ×2 IMPLANT
NEEDLE HYPO 18GX1.5 SHARP (NEEDLE) ×3
NEEDLE SPNL 20GX3.5 QUINCKE YW (NEEDLE) ×6 IMPLANT
NS IRRIG 500ML POUR BTL (IV SOLUTION) ×3 IMPLANT
PACK TOTAL KNEE (MISCELLANEOUS) ×3 IMPLANT
PAD WRAPON POLAR KNEE (MISCELLANEOUS) ×1 IMPLANT
PENCIL SMOKE EVACUATOR COATED (MISCELLANEOUS) ×3 IMPLANT
PENCIL SMOKE ULTRAEVAC 22 CON (MISCELLANEOUS) ×3 IMPLANT
PIN FIXATION 1/8DIA X 3INL (PIN) ×9 IMPLANT
PULSAVAC PLUS IRRIG FAN TIP (DISPOSABLE) ×3
SOL .9 NS 3000ML IRR  AL (IV SOLUTION) ×2
SOL .9 NS 3000ML IRR AL (IV SOLUTION) ×1
SOL .9 NS 3000ML IRR UROMATIC (IV SOLUTION) ×1 IMPLANT
SOL PREP PVP 2OZ (MISCELLANEOUS) ×3
SOLUTION PREP PVP 2OZ (MISCELLANEOUS) ×1 IMPLANT
SPONGE DRAIN TRACH 4X4 STRL 2S (GAUZE/BANDAGES/DRESSINGS) ×3 IMPLANT
STAPLER SKIN PROX 35W (STAPLE) ×3 IMPLANT
STOCKINETTE IMPERV 14X48 (MISCELLANEOUS) IMPLANT
STRAP TIBIA SHORT (MISCELLANEOUS) ×3 IMPLANT
SUCTION FRAZIER HANDLE 10FR (MISCELLANEOUS) ×4
SUCTION TUBE FRAZIER 10FR DISP (MISCELLANEOUS) ×1 IMPLANT
SUT VIC AB 0 CT1 36 (SUTURE) ×6 IMPLANT
SUT VIC AB 1 CT1 36 (SUTURE) ×6 IMPLANT
SUT VIC AB 2-0 CT2 27 (SUTURE) ×3 IMPLANT
SYR 20ML LL LF (SYRINGE) ×3 IMPLANT
SYR 30ML LL (SYRINGE) ×6 IMPLANT
TIBIA ATTUNE KNEE SYS BASE SZ6 (Knees) ×3 IMPLANT
TIP FAN IRRIG PULSAVAC PLUS (DISPOSABLE) ×1 IMPLANT
TOWEL OR 17X26 4PK STRL BLUE (TOWEL DISPOSABLE) ×3 IMPLANT
TOWER CARTRIDGE SMART MIX (DISPOSABLE) ×3 IMPLANT
TRAY FOLEY MTR SLVR 16FR STAT (SET/KITS/TRAYS/PACK) ×3 IMPLANT
WRAPON POLAR PAD KNEE (MISCELLANEOUS) ×3

## 2019-11-09 NOTE — Anesthesia Procedure Notes (Signed)
Procedure Name: Intubation Performed by: Caryl Asp, CRNA Pre-anesthesia Checklist: Patient identified, Patient being monitored, Timeout performed, Emergency Drugs available and Suction available Patient Re-evaluated:Patient Re-evaluated prior to induction Oxygen Delivery Method: Circle system utilized Preoxygenation: Pre-oxygenation with 100% oxygen Induction Type: IV induction Ventilation: Mask ventilation without difficulty Laryngoscope Size: McGraph and 4 Grade View: Grade I Tube type: Oral Tube size: 7.5 mm Number of attempts: 1 Airway Equipment and Method: Stylet and Video-laryngoscopy Placement Confirmation: ETT inserted through vocal cords under direct vision,  positive ETCO2 and breath sounds checked- equal and bilateral Secured at: 23 cm Tube secured with: Tape Dental Injury: Teeth and Oropharynx as per pre-operative assessment

## 2019-11-09 NOTE — Anesthesia Preprocedure Evaluation (Addendum)
Anesthesia Evaluation  Patient identified by MRN, date of birth, ID band Patient awake    Reviewed: Allergy & Precautions, H&P , NPO status , Patient's Chart, lab work & pertinent test results  History of Anesthesia Complications Negative for: history of anesthetic complications  Airway Mallampati: III  TM Distance: <3 FB Neck ROM: limited    Dental  (+) Chipped   Pulmonary neg shortness of breath, sleep apnea and Continuous Positive Airway Pressure Ventilation ,           Cardiovascular Exercise Tolerance: Good hypertension, Pt. on medications (-) angina(-) Past MI and (-) DOE      Neuro/Psych Anxiety CVA negative psych ROS   GI/Hepatic Neg liver ROS, GERD  Medicated and Controlled,  Endo/Other  diabetes, Type 2  Renal/GU Renal InsufficiencyRenal disease  negative genitourinary   Musculoskeletal  (+) Arthritis ,   Abdominal   Peds negative pediatric ROS (+)  Hematology negative hematology ROS (+)   Anesthesia Other Findings Past Medical History: No date: Diabetes mellitus without complication (HCC) No date: ED (erectile dysfunction) No date: GERD (gastroesophageal reflux disease) No date: Hyperlipidemia No date: Hypertension No date: OSA (obstructive sleep apnea) No date: Stroke [facility])  Past Surgical History: No date: COLONOSCOPY No date: KNEE ARTHROSCOPY; Left No date: TONSILLECTOMY  BMI    Body Mass Index: 34.53 kg/m      Reproductive/Obstetrics negative OB ROS                             Anesthesia Physical  Anesthesia Plan  ASA: III  Anesthesia Plan: Spinal   Post-op Pain Management:    Induction: Intravenous  PONV Risk Score and Plan:   Airway Management Planned: Natural Airway and Nasal Cannula  Additional Equipment:   Intra-op Plan:   Post-operative Plan:   Informed Consent: I have reviewed the patients History and Physical, chart, labs and discussed  the procedure including the risks, benefits and alternatives for the proposed anesthesia with the patient or authorized representative who has indicated his/her understanding and acceptance.     Dental Advisory Given  Plan Discussed with: Anesthesiologist, CRNA and Surgeon  Anesthesia Plan Comments: (Patient reports no bleeding problems and no anticoagulant use.  Plan for spinal with backup GA  Patient consented for risks of anesthesia including but not limited to:  - adverse reactions to medications - risk of bleeding, infection, nerve damage and headache - risk of failed spinal - damage to teeth, lips or other oral mucosa - sore throat or hoarseness - Damage to heart, brain, lungs or loss of life  Patient voiced understanding.)        Anesthesia Quick Evaluation

## 2019-11-09 NOTE — Op Note (Signed)
OPERATIVE NOTE  DATE OF SURGERY:  11/09/2019  PATIENT NAME:  Samuel [name]   DOB: 1951-01-27  MRN: [id number]  PRE-OPERATIVE DIAGNOSIS: Degenerative arthrosis of the right knee, primary  POST-OPERATIVE DIAGNOSIS:  Same  PROCEDURE:  Right total knee arthroplasty using computer-assisted navigation  SURGEON:  Marciano Sequin. M.D.  ASSISTANT: Cassell Smiles, PA-C (present and scrubbed throughout the case, critical for assistance with exposure, retraction, instrumentation, and closure)  ANESTHESIA: general  ESTIMATED BLOOD LOSS: 50 mL  FLUIDS REPLACED: 1100 mL of crystalloid  TOURNIQUET TIME: 95 minutes  DRAINS: Wound VAC  SOFT TISSUE RELEASES: Anterior cruciate ligament, posterior cruciate ligament, deep medial collateral ligament, patellofemoral ligament  IMPLANTS UTILIZED: DePuy Attune size 6 posterior stabilized femoral component (cemented), size 6 rotating platform tibial component (cemented), 38 mm medialized dome patella (cemented), and a 5 mm stabilized rotating platform polyethylene insert.  INDICATIONS FOR SURGERY: Samuel [name] is a 69 y.o. year old male with a long history of progressive knee pain. X-rays demonstrated severe degenerative changes in tricompartmental fashion. The patient had not seen any significant improvement despite conservative nonsurgical intervention. After discussion of the risks and benefits of surgical intervention, the patient expressed understanding of the risks benefits and agree with plans for total knee arthroplasty.   The risks, benefits, and alternatives were discussed at length including but not limited to the risks of infection, bleeding, nerve injury, stiffness, blood clots, the need for revision surgery, cardiopulmonary complications, among others, and they were willing to proceed.  PROCEDURE IN DETAIL: The patient was brought into the operating room and, after adequate general anesthesia was achieved, a tourniquet was placed on the patient's  upper thigh. The patient's knee and leg were cleaned and prepped with alcohol and DuraPrep and draped in the usual sterile fashion. A "timeout" was performed as per usual protocol. The lower extremity was exsanguinated using an Esmarch, and the tourniquet was inflated to 300 mmHg. An anterior longitudinal incision was made followed by a standard mid vastus approach. The deep fibers of the medial collateral ligament were elevated in a subperiosteal fashion off of the medial flare of the tibia so as to maintain a continuous soft tissue sleeve. The patella was subluxed laterally and the patellofemoral ligament was incised. Inspection of the knee demonstrated severe degenerative changes with full-thickness loss of articular cartilage. Osteophytes were debrided using a rongeur. Anterior and posterior cruciate ligaments were excised. Two 4.0 mm Schanz pins were inserted in the femur and into the tibia for attachment of the array of trackers used for computer-assisted navigation. Hip center was identified using a circumduction technique. Distal landmarks were mapped using the computer. The distal femur and proximal tibia were mapped using the computer. The distal femoral cutting guide was positioned using computer-assisted navigation so as to achieve a 5 distal valgus cut. The femur was sized and it was felt that a size 6 femoral component was appropriate. A size 6 femoral cutting guide was positioned and the anterior cut was performed and verified using the computer. This was followed by completion of the posterior and chamfer cuts. Femoral cutting guide for the central box was then positioned in the center box cut was performed.  Attention was then directed to the proximal tibia. Medial and lateral menisci were excised. The extramedullary tibial cutting guide was positioned using computer-assisted navigation so as to achieve a 0 varus-valgus alignment and 3 posterior slope. The cut was performed and verified using  the computer. The proximal tibia was sized and  it was felt that a size 6 tibial tray was appropriate. Tibial and femoral trials were inserted followed by insertion of a 5 mm polyethylene insert. This allowed for excellent mediolateral soft tissue balancing both in flexion and in full extension. Finally, the patella was cut and prepared so as to accommodate a 38 mm medialized dome patella. A patella trial was placed and the knee was placed through a range of motion with excellent patellar tracking appreciated. The femoral trial was removed after debridement of posterior osteophytes. The central post-hole for the tibial component was reamed followed by insertion of a keel punch. Tibial trials were then removed. Cut surfaces of bone were irrigated with copious amounts of normal saline using pulsatile lavage and then suctioned dry. Polymethylmethacrylate cement with gentamicin was prepared in the usual fashion using a vacuum mixer. Cement was applied to the cut surface of the proximal tibia as well as along the undersurface of a size 6 rotating platform tibial component. Tibial component was positioned and impacted into place. Excess cement was removed using [employer] fast streamer. Cement was then applied to the cut surfaces of the femur as well as along the posterior flanges of the size 6 femoral component. The femoral component was positioned and impacted into place. Excess cement was removed using [employer] fast streamer. A 5 mm polyethylene trial was inserted and the knee was brought into full extension with steady axial compression applied. Finally, cement was applied to the backside of a 38 mm medialized dome patella and the patellar component was positioned and patellar clamp applied. Excess cement was removed using [employer] fast streamer. After adequate curing of the cement, the tourniquet was deflated after a total tourniquet time of 95 minutes. Hemostasis was achieved using electrocautery. The knee was irrigated with copious  amounts of normal saline using pulsatile lavage followed by 500 ml of Surgiphor and then suctioned dry. 20 mL of 1.3% Exparel and 60 mL of 0.25% Marcaine in 40 mL of normal saline was injected along the posterior capsule, medial and lateral gutters, and along the arthrotomy site. A 5 mm stabilized rotating platform polyethylene insert was inserted and the knee was placed through a range of motion with excellent mediolateral soft tissue balancing appreciated and excellent patellar tracking noted. 2 medium drains were placed in the wound bed and brought out through separate stab incisions. The medial parapatellar portion of the incision was reapproximated using interrupted sutures of #1 Vicryl. Subcutaneous tissue was approximated in layers using first #0 Vicryl followed #2-0 Vicryl. The skin was approximated with skin staples.  A wound VAC was applied followed by a sterile dressing.  The patient tolerated the procedure well and was transported to the recovery room in stable condition.    Sherrill Buikema P. Holley Bouche., M.D.

## 2019-11-09 NOTE — H&P (Signed)
The patient has been re-examined, and the chart reviewed, and there have been no interval changes to the documented history and physical.    The risks, benefits, and alternatives have been discussed at length. The patient expressed understanding of the risks benefits and agreed with plans for surgical intervention.  Samuel [name], Jr. M.D.    

## 2019-11-09 NOTE — Discharge Instructions (Signed)
Instructions after Total Knee Replacement   James P. Holley Bouche., M.D.     Dept. of [facility]  [place], [place]  [address]  Phone: [phone]   Fax: [phone]    DIET: . Drink plenty of non-alcoholic fluids. . Resume your normal diet. Include foods high in fiber.  ACTIVITY:  . You may use crutches or a walker with weight-bearing as tolerated, unless instructed otherwise. . You may be weaned off of the walker or crutches by your Physical Therapist.  . Do NOT place pillows under the knee. Anything placed under the knee could limit your ability to straighten the knee.   . Continue doing gentle exercises. Exercising will reduce the pain and swelling, increase motion, and prevent muscle weakness.   . Please continue to use the TED compression stockings for 6 weeks. You may remove the stockings at night, but should reapply them in the morning. . Do not drive or operate any equipment until instructed.  WOUND CARE:  . Continue to use the PolarCare or ice packs periodically to reduce pain and swelling. . You may bathe or shower after the staples are removed at the first office visit following surgery.  MEDICATIONS: . Samuel [name] may resume your regular medications. . Please take the pain medication as prescribed on the medication. . Do not take pain medication on an empty stomach. . You have been given a prescription for a blood thinner (Lovenox or Coumadin). Please take the medication as instructed. (NOTE: After completing a 2 week course of Lovenox, take one Enteric-coated aspirin once a day. This along with elevation will help reduce the possibility of phlebitis in your operated leg.) . Do not drive or drink alcoholic beverages when taking pain medications.  CALL THE OFFICE FOR: . Temperature above 101 degrees . Excessive bleeding or drainage on the dressing. . Excessive swelling, coldness, or paleness of the toes. . Persistent  nausea and vomiting.  FOLLOW-UP:  . You should have an appointment to return to the office in 10-14 days after surgery. . Arrangements have been made for continuation of Physical Therapy (either home therapy or outpatient therapy).     [facility] Department Directory         [email or web address]       [email or web address]          Cardiology  Appointments: [place] [phone]  Endocrinology  Appointments: [place] [phone] [place] [phone]  Gastroenterology  Appointments: [place] [phone] [place] [phone]        General Surgery   Appointments: [facility]  Internal Medicine/Family Medicine  Appointments: [facility] [place] - [phone] [place] [phone]  Metabolic and [place] Loss Surgery  Appointments: [facility]        Neurology  Appointments: [place] [phone] [place] - [phone]  Neurosurgery  Appointments: [place]  Obstetrics & Gynecology  Appointments: [place] [phone] [place] - [phone]        Pediatrics  Appointments: [place] [phone] [place] - [phone]  Physiatry  Appointments: [place] [phone]  Physical Therapy  Appointments: [place] [phone]        Podiatry  Appointments: [place] [phone] [place] - [phone]  Pulmonology  Appointments: [place]  Rheumatology  Appointments: [place] [phone]        [place] Location: [facility]  [place],   [address]  [place] Location: [facility]  [address] [place], [place]  [address]  [place] Location: [facility] [address] [place], [place]  [address]      AMBULATORY SURGERY  DISCHARGE INSTRUCTIONS   1) The drugs that you were given will stay in your system until tomorrow so for  the next 24 hours you should not:  A) Drive an automobile B) Make any legal decisions C) Drink any alcoholic beverage   2) You may resume regular meals tomorrow.  Today it is better to start with liquids and gradually work up to solid foods.  You may eat anything you prefer, but it is better to start with liquids, then soup and crackers, and gradually work up to solid foods.   3) Please notify your doctor immediately if you have any unusual bleeding, trouble breathing, redness and pain at the surgery site, drainage, fever, or pain not relieved by medication.    4) Additional Instructions:        Please contact your physician with any problems or Same Day Surgery at [phone], Monday through Friday 6 am to 4 pm, or [place] at [place] Main number at [phone]. 

## 2019-11-09 NOTE — Transfer of Care (Signed)
Immediate Anesthesia Transfer of Care Note  Patient: Samuel [name]  Procedure(s) Performed: Procedure(s): COMPUTER ASSISTED TOTAL KNEE ARTHROPLASTY (Right)  Patient Location: PACU  Anesthesia Type:General  Level of Consciousness: sedated  Airway & Oxygen Therapy: Patient Spontanous Breathing and Patient connected to face mask oxygen  Post-op Assessment: Report given to RN and Post -op Vital signs reviewed and stable  Post vital signs: Reviewed and stable  Last Vitals:  Vitals:   11/09/19 1000 11/09/19 1556  BP: (!) 150/88 (!) 145/85  Pulse: 71 85  Resp: 16 18  Temp: 36.7 C (P) 36.9 C  SpO2: 859% 09%    Complications: No apparent anesthesia complications

## 2019-11-09 NOTE — Evaluation (Signed)
Physical Therapy Evaluation Patient Details Name: Samuel [name] MRN: [id number] DOB: 10/29/1950 Today's Date: 11/09/2019   History of Present Illness  Pt is a 69 yo male diagnosed with degenerative arthrosis of the right knee and is s/p elective R TKA.  PMH incudes HTN, CVA, and DM.    Clinical Impression  Pt was pleasant and motivated to participate during the session.  Pt was slightly groggy initially but improved with mobility.  Pt reported very mild light headedness throughout the session that did not vary/worsen by position.  Pt reported feeling safe for OOB activity with activity slowly progressed for pt safety with nursing providing a chair follow during amb/stair training as well.  Overall pt performed very well requiring no physical assistance with any functional task.  Pt demonstrated good control and stability as well as carryover for proper sequencing during amb and stair training.  Pt's SpO2 and HR WNL throughout the session.  Pt will benefit from HHPT services upon discharge to safely address deficits listed in patient problem list for decreased caregiver assistance and eventual return to PLOF.      Follow Up Recommendations Home health PT;Supervision - Intermittent    Equipment Recommendations  None recommended by PT    Recommendations for Other Services       Precautions / Restrictions Precautions Precautions: Fall Precaution Comments: Pt able to perform Ind RLE SLR's without extensor lag, no KI required Restrictions Weight Bearing Restrictions: Yes RLE Weight Bearing: Weight bearing as tolerated      Mobility  Bed Mobility Overal bed mobility: Modified Independent             General bed mobility comments: Extra time and effort required  Transfers Overall transfer level: Needs assistance Equipment used: Rolling walker (2 wheeled) Transfers: Sit to/from Stand Sit to Stand: Supervision         General transfer comment: Min verbal cues for general  sequencing including hand and foot placement  Ambulation/Gait Ambulation/Gait assistance: Min guard Gait Distance (Feet): 80 Feet Assistive device: Rolling walker (2 wheeled) Gait Pattern/deviations: Step-to pattern;Step-through pattern;Decreased step length - left;Decreased stance time - right Gait velocity: decreased   General Gait Details: Step-to gait pattern that progressed to step-through with good stability and no buckling  Stairs Stairs: Yes Stairs assistance: Min guard Stair Management: One rail Right;Step to pattern;Forwards Number of Stairs: 4 General stair comments: Verbal cues for sequencing and visual demonstration followed by pt practice with pt demonstrating good carryover and good control/stability throughout  Wheelchair Mobility    Modified Rankin (Stroke Patients Only)       Balance Overall balance assessment: No apparent balance deficits (not formally assessed)                                           Pertinent Vitals/Pain Pain Assessment: No/denies pain    Home Living Family/patient expects to be discharged to:: Private residence Living Arrangements: Spouse/significant other Available Help at Discharge: Family;Available 24 hours/day Type of Home: House Home Access: Stairs to enter Entrance Stairs-Rails: Right Entrance Stairs-Number of Steps: 3+1 Home Layout: Multi-level;Able to live on main level with bedroom/bathroom Home Equipment: Shower seat - built in;Walker - 2 wheels;Bedside commode;Hand held shower head;Adaptive equipment;Grab bars - toilet      Prior Function Level of Independence: Independent         Comments: Ind amb community distances without an AD,  no fall history, Ind with ADLs, active including golfing multiple times per week     Hand Dominance   Dominant Hand: Right    Extremity/Trunk Assessment   Upper Extremity Assessment Upper Extremity Assessment: Overall WFL for tasks assessed    Lower  Extremity Assessment Lower Extremity Assessment: Generalized weakness;RLE deficits/detail;LLE deficits/detail RLE Deficits / Details: RLE ankle strength and AROM WNL; R hip flex >/= 3/5 RLE: Unable to fully assess due to pain LLE Deficits / Details: LLE strength WNL LLE Sensation: WNL       Communication   Communication: No difficulties  Cognition Arousal/Alertness: Awake/alert Behavior During Therapy: WFL for tasks assessed/performed Overall Cognitive Status: Within Functional Limits for tasks assessed                                        General Comments      Exercises Total Joint Exercises Ankle Circles/Pumps: AROM;Strengthening;Both;5 reps;10 reps Quad Sets: AROM;Strengthening;Right;10 reps;5 reps Hip ABduction/ADduction: AROM;Strengthening;Right;10 reps Straight Leg Raises: AROM;Strengthening;Right;10 reps Long Arc Quad: AROM;Strengthening;Both;15 reps Knee Flexion: AROM;Strengthening;Right;15 reps Goniometric ROM: R knee AROM grossly 0-70 deg Marching in Standing: AROM;Strengthening;Both;10 reps;Standing Other Exercises Other Exercises: HEP education/review per handout Other Exercises: Car transfer sequencing verbally and with visual demonstration using chair for car simulation Other Exercises: 90 deg R turn training to prevent CKC twisting on the R kneee   Assessment/Plan    PT Assessment Patient needs continued PT services  PT Problem List Decreased strength;Decreased range of motion;Decreased activity tolerance;Decreased mobility;Decreased knowledge of use of DME       PT Treatment Interventions DME instruction;Gait training;Stair training;Functional mobility training;Therapeutic activities;Therapeutic exercise;Balance training;Patient/family education    PT Goals (Current goals can be found in the Care Plan section)  Acute Rehab PT Goals Patient Stated Goal: To get back to golfing PT Goal Formulation: With patient Time For Goal Achievement:  11/22/19 Potential to Achieve Goals: Good    Frequency BID   Barriers to discharge        Co-evaluation               AM-PAC PT "6 Clicks" Mobility  Outcome Measure Help needed turning from your back to your side while in a flat bed without using bedrails?: A Little Help needed moving from lying on your back to sitting on the side of a flat bed without using bedrails?: A Little Help needed moving to and from a bed to a chair (including a wheelchair)?: A Little Help needed standing up from a chair using your arms (e.g., wheelchair or bedside chair)?: A Little Help needed to walk in hospital room?: A Little Help needed climbing 3-5 steps with a railing? : A Little 6 Click Score: 18    End of Session Equipment Utilized During Treatment: Gait belt Activity Tolerance: Patient tolerated treatment well Patient left: in chair;with nursing/sitter in room Nurse Communication: Mobility status PT Visit Diagnosis: Other abnormalities of gait and mobility (R26.89);Muscle weakness (generalized) (M62.81)    Time: 3662-9476 PT Time Calculation (min) (ACUTE ONLY): 44 min   Charges:   PT Evaluation $PT Eval Moderate Complexity: 1 Mod PT Treatments $Gait Training: 8-22 mins $Therapeutic Exercise: 8-22 mins        D. Royetta Asal PT, DPT 11/09/19, 7:30 PM

## 2019-11-10 ENCOUNTER — Encounter: Payer: Self-pay | Admitting: Orthopedic Surgery

## 2019-11-11 LAB — C-REACTIVE PROTEIN

## 2019-11-12 NOTE — Anesthesia Postprocedure Evaluation (Signed)
Anesthesia Post Note  Patient: Samuel [name]  Procedure(s) Performed: COMPUTER ASSISTED TOTAL KNEE ARTHROPLASTY (Right Knee)  Patient location during evaluation: PACU Anesthesia Type: General Level of consciousness: awake and alert and oriented Pain management: pain level controlled Vital Signs Assessment: post-procedure vital signs reviewed and stable Respiratory status: spontaneous breathing Cardiovascular status: blood pressure returned to baseline Anesthetic complications: no   No complications documented.   Last Vitals:  Vitals:   11/09/19 1656 11/09/19 1721  BP:    Pulse: 80 71  Resp: 16 12  Temp:    SpO2: 94% 95%    Last Pain:  Vitals:   11/10/19 0831  TempSrc:   PainSc: 3                  Shervin Cypert

## 2021-11-20 ENCOUNTER — Emergency Department: Payer: Medicare Other

## 2021-11-20 ENCOUNTER — Inpatient Hospital Stay
Admission: EM | Admit: 2021-11-20 | Discharge: 2021-11-22 | DRG: 281 | Disposition: A | Discharge status: Other Acute Inpatient Hospital | Payer: Medicare Other | Attending: Family Medicine | Admitting: Family Medicine

## 2021-11-20 ENCOUNTER — Other Ambulatory Visit: Payer: Self-pay

## 2021-11-20 ENCOUNTER — Other Ambulatory Visit
Admission: RE | Admit: 2021-11-20 | Discharge: 2021-11-20 | Disposition: A | Discharge status: Home / Self Care | Payer: Medicare Other | Source: Ambulatory Visit | Attending: Internal Medicine | Admitting: Internal Medicine

## 2021-11-20 DIAGNOSIS — Z9989 Dependence on other enabling machines and devices: Secondary | ICD-10-CM | POA: Diagnosis not present

## 2021-11-20 DIAGNOSIS — E1165 Type 2 diabetes mellitus with hyperglycemia: Secondary | ICD-10-CM | POA: Diagnosis present

## 2021-11-20 DIAGNOSIS — E1169 Type 2 diabetes mellitus with other specified complication: Secondary | ICD-10-CM | POA: Diagnosis not present

## 2021-11-20 DIAGNOSIS — Z96653 Presence of artificial knee joint, bilateral: Secondary | ICD-10-CM | POA: Diagnosis present

## 2021-11-20 DIAGNOSIS — I4510 Unspecified right bundle-branch block: Secondary | ICD-10-CM | POA: Diagnosis present

## 2021-11-20 DIAGNOSIS — I6523 Occlusion and stenosis of bilateral carotid arteries: Secondary | ICD-10-CM | POA: Diagnosis not present

## 2021-11-20 DIAGNOSIS — I214 Non-ST elevation (NSTEMI) myocardial infarction: Secondary | ICD-10-CM | POA: Diagnosis not present

## 2021-11-20 DIAGNOSIS — I2101 ST elevation (STEMI) myocardial infarction involving left main coronary artery: Secondary | ICD-10-CM | POA: Diagnosis not present

## 2021-11-20 DIAGNOSIS — Z79899 Other long term (current) drug therapy: Secondary | ICD-10-CM | POA: Diagnosis not present

## 2021-11-20 DIAGNOSIS — E785 Hyperlipidemia, unspecified: Secondary | ICD-10-CM | POA: Diagnosis present

## 2021-11-20 DIAGNOSIS — I10 Essential (primary) hypertension: Secondary | ICD-10-CM | POA: Diagnosis present

## 2021-11-20 DIAGNOSIS — Z7984 Long term (current) use of oral hypoglycemic drugs: Secondary | ICD-10-CM | POA: Diagnosis not present

## 2021-11-20 DIAGNOSIS — Z9911 Dependence on respirator [ventilator] status: Secondary | ICD-10-CM | POA: Diagnosis not present

## 2021-11-20 DIAGNOSIS — I2510 Atherosclerotic heart disease of native coronary artery without angina pectoris: Secondary | ICD-10-CM | POA: Diagnosis not present

## 2021-11-20 DIAGNOSIS — K219 Gastro-esophageal reflux disease without esophagitis: Secondary | ICD-10-CM | POA: Diagnosis present

## 2021-11-20 DIAGNOSIS — K5903 Drug induced constipation: Secondary | ICD-10-CM | POA: Diagnosis not present

## 2021-11-20 DIAGNOSIS — E669 Obesity, unspecified: Secondary | ICD-10-CM | POA: Diagnosis present

## 2021-11-20 DIAGNOSIS — R739 Hyperglycemia, unspecified: Secondary | ICD-10-CM | POA: Diagnosis not present

## 2021-11-20 DIAGNOSIS — R079 Chest pain, unspecified: Secondary | ICD-10-CM | POA: Insufficient documentation

## 2021-11-20 DIAGNOSIS — M1990 Unspecified osteoarthritis, unspecified site: Secondary | ICD-10-CM | POA: Diagnosis present

## 2021-11-20 DIAGNOSIS — N179 Acute kidney failure, unspecified: Secondary | ICD-10-CM | POA: Diagnosis present

## 2021-11-20 DIAGNOSIS — Z8249 Family history of ischemic heart disease and other diseases of the circulatory system: Secondary | ICD-10-CM | POA: Diagnosis not present

## 2021-11-20 DIAGNOSIS — Z6833 Body mass index (BMI) 33.0-33.9, adult: Secondary | ICD-10-CM | POA: Diagnosis not present

## 2021-11-20 DIAGNOSIS — I25110 Atherosclerotic heart disease of native coronary artery with unstable angina pectoris: Secondary | ICD-10-CM | POA: Diagnosis not present

## 2021-11-20 DIAGNOSIS — G4733 Obstructive sleep apnea (adult) (pediatric): Secondary | ICD-10-CM | POA: Diagnosis present

## 2021-11-20 DIAGNOSIS — Z7902 Long term (current) use of antithrombotics/antiplatelets: Secondary | ICD-10-CM | POA: Diagnosis not present

## 2021-11-20 DIAGNOSIS — Z7982 Long term (current) use of aspirin: Secondary | ICD-10-CM | POA: Diagnosis not present

## 2021-11-20 LAB — CBC WITH DIFFERENTIAL/PLATELET
Abs Immature Granulocytes: 0.04 10*3/uL (ref 0.00–0.07)
Basophils Absolute: 0 10*3/uL (ref 0.0–0.1)
Basophils Relative: 0 %
Eosinophils Absolute: 0.2 10*3/uL (ref 0.0–0.5)
Eosinophils Relative: 2 %
HCT: 42.2 % (ref 39.0–52.0)
Hemoglobin: 14.3 g/dL (ref 13.0–17.0)
Immature Granulocytes: 0 %
Lymphocytes Relative: 29 %
Lymphs Abs: 3 10*3/uL (ref 0.7–4.0)
MCH: 29.4 pg (ref 26.0–34.0)
MCHC: 33.9 g/dL (ref 30.0–36.0)
MCV: 86.7 fL (ref 80.0–100.0)
Monocytes Absolute: 0.5 10*3/uL (ref 0.1–1.0)
Monocytes Relative: 5 %
Neutro Abs: 6.3 10*3/uL (ref 1.7–7.7)
Neutrophils Relative %: 64 %
Platelets: 296 10*3/uL (ref 150–400)
RBC: 4.87 MIL/uL (ref 4.22–5.81)
RDW: 14.3 % (ref 11.5–15.5)
WBC: 10.1 10*3/uL (ref 4.0–10.5)
nRBC: 0 % (ref 0.0–0.2)

## 2021-11-20 LAB — BRAIN NATRIURETIC PEPTIDE: B Natriuretic Peptide: 35.4 pg/mL (ref 0.0–100.0)

## 2021-11-20 LAB — COMPREHENSIVE METABOLIC PANEL
ALT: 17 U/L (ref 0–44)
AST: 27 U/L (ref 15–41)
Albumin: 3.9 g/dL (ref 3.5–5.0)
Alkaline Phosphatase: 65 U/L (ref 38–126)
Anion gap: 15 (ref 5–15)
BUN: 24 mg/dL — ABNORMAL HIGH (ref 8–23)
CO2: 19 mmol/L — ABNORMAL LOW (ref 22–32)
Calcium: 9.7 mg/dL (ref 8.9–10.3)
Chloride: 103 mmol/L (ref 98–111)
Creatinine, Ser: 1.3 mg/dL — ABNORMAL HIGH (ref 0.61–1.24)
GFR, Estimated: 58 mL/min — ABNORMAL LOW (ref >60)
Glucose, Bld: 302 mg/dL — ABNORMAL HIGH (ref 70–99)
Potassium: 4.3 mmol/L (ref 3.5–5.1)
Sodium: 137 mmol/L (ref 135–145)
Total Bilirubin: 0.6 mg/dL (ref 0.3–1.2)
Total Protein: 7.9 g/dL (ref 6.5–8.1)

## 2021-11-20 LAB — PROTIME-INR
INR: 1.1 (ref 0.8–1.2)
Prothrombin Time: 13.7 seconds (ref 11.4–15.2)

## 2021-11-20 LAB — APTT: aPTT: 32 seconds (ref 24–36)

## 2021-11-20 LAB — TROPONIN I (HIGH SENSITIVITY)
Troponin I (High Sensitivity): 183 ng/L (ref <18)
Troponin I (High Sensitivity): 198 ng/L (ref <18)
Troponin I (High Sensitivity): 178 ng/L (ref <18)

## 2021-11-20 LAB — D-DIMER, QUANTITATIVE: D-Dimer, Quant: 1.15 ug/mL-FEU — ABNORMAL HIGH (ref 0.00–0.50)

## 2021-11-20 MED ORDER — TRAMADOL HCL 50 MG PO TABS: 50.0000 mg | ORAL_TABLET | Freq: Four times a day (QID) | ORAL | Status: DC | PRN

## 2021-11-20 MED ORDER — ASPIRIN 300 MG RE SUPP: 300.0000 mg | RECTAL | Status: AC

## 2021-11-20 MED ORDER — METOPROLOL TARTRATE 25 MG PO TABS
12.5000 mg | ORAL_TABLET | Freq: Two times a day (BID) | ORAL | Status: DC
  Administered 2021-11-20 – 2021-11-22 (×3): 12.5 mg via ORAL
  Filled 2021-11-20 (×4): qty 1

## 2021-11-20 MED ORDER — PANTOPRAZOLE SODIUM 40 MG PO TBEC
40.0000 mg | DELAYED_RELEASE_TABLET | Freq: Every day | ORAL | Status: DC
  Administered 2021-11-22: 40 mg via ORAL
  Filled 2021-11-20 (×2): qty 1

## 2021-11-20 MED ORDER — ASPIRIN 81 MG PO CHEW
324.0000 mg | CHEWABLE_TABLET | ORAL | Status: AC
  Administered 2021-11-20: 324 mg via ORAL
  Filled 2021-11-20: qty 4

## 2021-11-20 MED ORDER — SODIUM CHLORIDE 0.9 % IV SOLN: INTRAVENOUS | Status: DC

## 2021-11-20 MED ORDER — HEPARIN BOLUS VIA INFUSION
4000.0000 [IU] | Freq: Once | INTRAVENOUS | Status: AC
  Administered 2021-11-20: 4000 [IU] via INTRAVENOUS
  Filled 2021-11-20: qty 4000

## 2021-11-20 MED ORDER — OXYCODONE HCL 5 MG PO TABS: 5.0000 mg | ORAL_TABLET | ORAL | Status: DC | PRN

## 2021-11-20 MED ORDER — ALPRAZOLAM 0.25 MG PO TABS: 0.2500 mg | ORAL_TABLET | Freq: Two times a day (BID) | ORAL | Status: DC | PRN

## 2021-11-20 MED ORDER — ASPIRIN 81 MG PO TBEC
81.0000 mg | DELAYED_RELEASE_TABLET | Freq: Every day | ORAL | Status: DC
  Administered 2021-11-22: 81 mg via ORAL
  Filled 2021-11-20 (×2): qty 1

## 2021-11-20 MED ORDER — LORATADINE 10 MG PO TABS
10.0000 mg | ORAL_TABLET | Freq: Every day | ORAL | Status: DC
  Administered 2021-11-22: 10 mg via ORAL
  Filled 2021-11-20 (×2): qty 1

## 2021-11-20 MED ORDER — HEPARIN (PORCINE) 25000 UT/250ML-% IV SOLN
1500.0000 [IU]/h | INTRAVENOUS | Status: DC
  Administered 2021-11-20: 1200 [IU]/h via INTRAVENOUS
  Filled 2021-11-20 (×2): qty 250

## 2021-11-20 MED ORDER — IOHEXOL 350 MG/ML SOLN
75.0000 mL | Freq: Once | INTRAVENOUS | Status: AC | PRN
  Administered 2021-11-20: 75 mL via INTRAVENOUS

## 2021-11-20 MED ORDER — AMLODIPINE BESYLATE 5 MG PO TABS
5.0000 mg | ORAL_TABLET | Freq: Every day | ORAL | Status: DC
  Administered 2021-11-22: 5 mg via ORAL
  Filled 2021-11-20 (×2): qty 1

## 2021-11-20 MED ORDER — ACETAMINOPHEN 325 MG PO TABS: 650.0000 mg | ORAL_TABLET | ORAL | Status: DC | PRN

## 2021-11-20 MED ORDER — ASPIRIN 81 MG PO TBEC: 81.0000 mg | DELAYED_RELEASE_TABLET | Freq: Every day | ORAL | Status: DC

## 2021-11-20 MED ORDER — ONDANSETRON HCL 4 MG/2ML IJ SOLN: 4.0000 mg | Freq: Four times a day (QID) | INTRAMUSCULAR | Status: DC | PRN

## 2021-11-20 MED ORDER — ATORVASTATIN CALCIUM 80 MG PO TABS
80.0000 mg | ORAL_TABLET | Freq: Every day | ORAL | Status: DC
  Administered 2021-11-20 – 2021-11-22 (×2): 80 mg via ORAL
  Filled 2021-11-20: qty 4
  Filled 2021-11-20: qty 1
  Filled 2021-11-20: qty 4

## 2021-11-20 MED ORDER — NITROGLYCERIN 0.4 MG SL SUBL: 0.4000 mg | SUBLINGUAL_TABLET | SUBLINGUAL | Status: DC | PRN

## 2021-11-20 NOTE — H&P (Incomplete)
[place]   PATIENT NAME: Samuel [name]    MR#:  [id number]  DATE OF BIRTH:  1950/08/29  DATE OF ADMISSION:  11/20/2021  PRIMARY CARE PHYSICIAN: Kirk Ruths, MD   Patient is coming from: Home  REQUESTING/REFERRING PHYSICIAN: Ane Payment, MD  CHIEF COMPLAINT:  Chest pain  HISTORY OF PRESENT ILLNESS:  AIDDEN MARKOVIC is a 71 y.o. Caucasian male with medical history significant for OSA on CPAP, GERD, hypertension, lipidemia, type 2 diabetes mellitus and anxiety, who presented to the ER with acute onset of substernal chest pain felt as pressure and tightness with no nausea or vomiting however with left arm pain, that started about a couple weeks ago and has been intermittent since then and mainly exertional.He denies any dyspnea however has been having occasional cough without wheezing or hemoptysis.  No headache or dizziness or blurred vision.  No fever or chills.  He denies any leg pain or edema or recent travels or surgeries.  No dysuria, oliguria or hematuria or flank pain.  ED Course: When he came to the ER, vital signs were within normal.  CMP revealed a BUN of 24 and creatinine 1.3 and glucose of 302 with CO2 of 19 and.  I sensitive troponin I was 183 and later 198 and CBC was within normal.  BNP was 35.4. EKG as reviewed by me : EKG showed normal sinus rhythm with a rate of 80 with right bundle branch block and inferior T wave inversion. Imaging: Two-view chest x-ray showed no acute cardiopulmonary disease.  The patient was given 40 aspirin and will be started on IV heparin.  He was pain-free during my interview.  He will be admitted to a telemetry bed for further evaluation and management. PAST MEDICAL HISTORY:   Past Medical History:  Diagnosis Date   Anxiety    situational   Arthritis    Cancer ([place])    squamous cell carcinoma   Diabetes mellitus without complication ([place])    ED (erectile dysfunction)    GERD (gastroesophageal reflux disease)     Hyperlipidemia    Hypertension    OSA (obstructive sleep apnea)    CPAP   Stroke ([place])    no residual     PAST SURGICAL HISTORY:   Past Surgical History:  Procedure Laterality Date   COLONOSCOPY     KNEE ARTHROPLASTY Left 03/20/2019   Procedure: LEFT COMPUTER ASSISTED TOTAL KNEE ARTHROPLASTY;  Surgeon: Dereck Leep, MD;  Location: [facility] ORS;  Service: Orthopedics;  Laterality: Left;   KNEE ARTHROPLASTY Right 11/09/2019   Procedure: COMPUTER ASSISTED TOTAL KNEE ARTHROPLASTY;  Surgeon: Dereck Leep, MD;  Location: [facility] ORS;  Service: Orthopedics;  Laterality: Right;   KNEE ARTHROSCOPY Left    SHOULDER ARTHROSCOPY Right 2009   rotator cuff   TONSILLECTOMY      SOCIAL HISTORY:   Social History   Tobacco Use   Smoking status: Never   Smokeless tobacco: Never  Substance Use Topics   Alcohol use: Yes    Alcohol/week: 2.0 - 3.0 standard drinks of alcohol    Types: 2 - 3 Standard drinks or equivalent per week    FAMILY HISTORY:   Positive for coronary artery disease in his grandfather and sister.  His brother had MI in his early 74s.  DRUG ALLERGIES:  No Known Allergies  REVIEW OF SYSTEMS:   ROS As per history of present illness. All pertinent systems were reviewed above. Constitutional, HEENT, cardiovascular, respiratory, GI, GU,  musculoskeletal, neuro, psychiatric, endocrine, integumentary and hematologic systems were reviewed and are otherwise negative/unremarkable except for positive findings mentioned above in the HPI.   MEDICATIONS AT HOME:   Prior to Admission medications   Medication Sig Start Date End Date Taking? Authorizing Provider  amLODipine (NORVASC) 5 MG tablet Take 5 mg by mouth daily.    [provider]  aspirin 81 MG EC tablet Take 81 mg by mouth daily.     [provider]  enoxaparin (LOVENOX) 40 MG/0.4ML injection Inject 0.4 mLs (40 mg total) into the skin daily. 11/10/19   Hooten, Laurice Record, MD  lisinopril-hydrochlorothiazide  (ZESTORETIC) 20-25 MG tablet Take 1 tablet by mouth daily.     [provider]  loratadine (CLARITIN) 10 MG tablet Take 10 mg by mouth daily.    [provider]  metFORMIN (GLUCOPHAGE) 500 MG tablet Take 500 mg by mouth in the morning and at bedtime.  01/26/15 10/27/20  [provider]  omeprazole (PRILOSEC) 20 MG capsule Take 20 mg by mouth daily before breakfast.    [provider]  oxyCODONE (ROXICODONE) 5 MG immediate release tablet Take 1-2 tablets (5-10 mg total) by mouth every 4 (four) hours as needed for severe pain. 11/09/19   Hooten, Laurice Record, MD  pravastatin (PRAVACHOL) 40 MG tablet Take 40 mg by mouth daily.     [provider]  traMADol (ULTRAM) 50 MG tablet Take 1 tablet (50 mg total) by mouth every 6 (six) hours as needed for moderate pain. 11/09/19   Hooten, Laurice Record, MD      VITAL SIGNS:  Blood pressure 120/76, pulse 69, temperature 98.1 F (36.7 C), temperature source Oral, resp. rate 18, height '5\' 7"'$  (1.702 m), weight 97.8 kg, SpO2 93 %.  PHYSICAL EXAMINATION:  Physical Exam  GENERAL:  71 y.o.-year-old Caucasian male patient lying in the bed with no acute distress.  EYES: Pupils equal, round, reactive to light and accommodation. No scleral icterus. Extraocular muscles intact.  HEENT: Head atraumatic, normocephalic. Oropharynx and nasopharynx clear.  NECK:  Supple, no jugular venous distention. No thyroid enlargement, no tenderness.  LUNGS: Normal breath sounds bilaterally, no wheezing, rales,rhonchi or crepitation. No use of accessory muscles of respiration.  CARDIOVASCULAR: Regular rate and rhythm, S1, S2 normal. No murmurs, rubs, or gallops.  ABDOMEN: Soft, nondistended, nontender. Bowel sounds present. No organomegaly or mass.  EXTREMITIES: No pedal edema, cyanosis, or clubbing.  NEUROLOGIC: Cranial nerves II through XII are intact. Muscle strength 5/5 in all extremities. Sensation intact. Gait not checked.  PSYCHIATRIC: The  patient is alert and oriented x 3.  Normal affect and good eye contact. SKIN: No obvious rash, lesion, or ulcer.   LABORATORY PANEL:   CBC Recent Labs  Lab 11/20/21 1956  WBC 10.1  HGB 14.3  HCT 42.2  PLT 296   ------------------------------------------------------------------------------------------------------------------  Chemistries  Recent Labs  Lab 11/20/21 1956  NA 137  K 4.3  CL 103  CO2 19*  GLUCOSE 302*  BUN 24*  CREATININE 1.30*  CALCIUM 9.7  AST 27  ALT 17  ALKPHOS 65  BILITOT 0.6   ------------------------------------------------------------------------------------------------------------------  Cardiac Enzymes No results for input(s): "TROPONINI" in the last 168 hours. ------------------------------------------------------------------------------------------------------------------  RADIOLOGY:  CT Angio Chest PE W/Cm &/Or Wo Cm  Result Date: 11/20/2021 CLINICAL DATA:  Pulmonary embolism (PE) suspected, positive D-dimer EXAM: CT ANGIOGRAPHY CHEST WITH CONTRAST TECHNIQUE: Multidetector CT imaging of the chest was performed using the standard protocol during bolus administration of intravenous contrast. Multiplanar  CT image reconstructions and MIPs were obtained to evaluate the vascular anatomy. RADIATION DOSE REDUCTION: This exam was performed according to the departmental dose-optimization program which includes automated exposure control, adjustment of the mA and/or kV according to patient size and/or use of iterative reconstruction technique. CONTRAST:  90m OMNIPAQUE IOHEXOL 350 MG/ML SOLN COMPARISON:  None Available. FINDINGS: Cardiovascular: No filling defects in the pulmonary arteries to suggest pulmonary emboli. Heart is normal size. Aorta is normal caliber. Scattered coronary artery and aortic calcifications. Mediastinum/Nodes: No mediastinal, hilar, or axillary adenopathy. Trachea and esophagus are unremarkable. Thyroid unremarkable. Lungs/Pleura: No  confluent opacities or effusions. Upper Abdomen: No acute findings Musculoskeletal: Chest wall soft tissues are unremarkable. No acute bony abnormality. Review of the MIP images confirms the above findings. IMPRESSION: No evidence of pulmonary embolus. No acute cardiopulmonary disease. Coronary artery disease. Aortic Atherosclerosis (ICD10-I70.0). Electronically Signed   By: KRolm BaptiseM.D.   On: 11/20/2021 21:21   DG Chest 2 View  Result Date: 11/20/2021 CLINICAL DATA:  Chest pain EXAM: CHEST - 2 VIEW COMPARISON:  None available FINDINGS: No focal consolidation, pleural effusion, or pneumothorax. Bibasilar atelectasis/scarring. Normal cardiomediastinal silhouette. No acute osseous abnormality. Aortic atherosclerotic calcification. Postoperative changes right humeral head IMPRESSION: No active cardiopulmonary disease. Electronically Signed   By: TPlacido SouM.D.   On: 11/20/2021 20:20      IMPRESSION AND PLAN:  Assessment and Plan: * NSTEMI (non-ST elevated myocardial infarction) ([facility] - The patient be admitted to a cardiac telemetry bed. - We will follow serial troponins.   - We will continue on aspirin and place him on as as needed sublingual nitroglycerin and morphine sulfate for pain. - We will continue IV heparin. - High-dose statin therapy will be provided. - We will add small dose beta-blocker therapy. - 2D echo and cardiology consult will be obtained. - I notified Dr. PSaralyn Pilarabout the patient.    AKI (acute kidney injury) ([place] - The patient will be hydrated with IV normal saline.  This is likely prerenal. - We will avoid nephrotoxins. - We will follow BMP.  Uncontrolled type 2 diabetes mellitus with hyperglycemia, without long-term current use of insulin ([place] - The patient will be with placed on supplement coverage with NovoLog. - We will hold off metformin.  GERD without esophagitis - We will continue PPI.  Essential hypertension - We will continue amlodipine and  hold off Zestoretic for now given acute kidney injury.  Dyslipidemia - She will be on high-dose statin therapy.  Obstructive sleep apnea - We will continue CPAP nightly.    DVT prophylaxis: IV heparin. Advanced Care Planning:  Code Status: full code.  Family Communication:  The plan of care was discussed in details with the patient (and family). I answered all questions. The patient agreed to proceed with the above mentioned plan. Further management will depend upon hospital course. Disposition Plan: Back to previous home environment Consults called: Cardiology. All the records are reviewed and case discussed with ED provider.  Status is: Inpatient    At the time of the admission, it appears that the appropriate admission status for this patient is inpatient.  This is judged to be reasonable and necessary in order to provide the required intensity of service to ensure the patient's safety given the presenting symptoms, physical exam findings and initial radiographic and laboratory data in the context of comorbid conditions.  The patient requires inpatient status due to high intensity of service, high risk of further deterioration and high frequency of  surveillance required.  I certify that at the time of admission, it is my clinical judgment that the patient will require inpatient hospital care extending more than 2 midnights.                            Dispo: The patient is from: Home              Anticipated d/c is to: Home              Patient currently is not medically stable to d/c.              Difficult to place patient: No  Christel Mormon M.D on 11/21/2021 at 2:02 AM  [facility]   From 7 PM-7 AM, contact night-coverage [email or web address]  CC: Primary care physician; Kirk Ruths, MD

## 2021-11-20 NOTE — ED Triage Notes (Signed)
Pt presents via POV. Reports seen by PCP today. PCP called to come to ED due to abnormal cardiac blood work. Reports over the last 2 weeks has been experiencing fatigue and chest pain intermittently. Reports had episode while outside working in yard of midsternal chest pain x2 weeks ago.

## 2021-11-20 NOTE — ED Triage Notes (Signed)
Reports dyspnea and chest pain on exertion.

## 2021-11-20 NOTE — ED Provider Notes (Signed)
[facility] Provider Note    Event Date/Time   First Samuel Initiated Contact with Patient 11/20/21 2018     (approximate)   History   No chief complaint on file.   HPI  Samuel [name] is a 71 y.o. male with no prior cardiac history who presents after he was told he had an abnormal heart blood test earlier today.  The patient states that about 2 weeks ago he had a syncopal event while doing some work outside.  Since that time he has had shortness of breath and lightheadedness on exertion, and over the last week this has been during minimal exertion such as walking.  Today he was doing some work outside and had an episode of shortness of breath and chest pain.  He saw the doctor today and had blood work drawn.  He was then called and told that one of his heart blood tests was abnormal and that it looked like he was having a heart attack.  He was instructed to come to the ED.  I reviewed the past medical record.  Per the note from Dr. Ouida Sills from earlier today, the patient has a history of diabetes, hypertension and hyperlipidemia.  He was being worked up for chest pain on exertion for 2 weeks.    Physical Exam   Triage Vital Signs: ED Triage Vitals  Enc Vitals Group     BP 11/20/21 1956 135/82     Pulse Rate 11/20/21 1956 85     Resp 11/20/21 1956 16     Temp 11/20/21 1956 (!) 97.3 F (36.3 C)     Temp Source 11/20/21 1956 Oral     SpO2 11/20/21 1956 96 %     Weight 11/20/21 1957 213 lb (96.6 kg)     Height 11/20/21 1957 '5\' 7"'$  (1.702 m)     Head Circumference --      Peak Flow --      Pain Score 11/20/21 1957 0     Pain Loc --      Pain Edu? --      Excl. in [place]? --     Most recent vital signs: Vitals:   11/20/21 2130 11/20/21 2145  BP: 119/80 119/80  Pulse: 75 78  Resp: 18   Temp:    SpO2: 95%      General: Awake, no distress.  CV:  Good peripheral perfusion.  Normal heart sounds. Resp:  Normal effort.  Lungs CTAB. Abd:  No distention.   Other:  No peripheral edema.   ED Results / Procedures / Treatments   Labs (all labs ordered are listed, but only abnormal results are displayed) Labs Reviewed  COMPREHENSIVE METABOLIC PANEL - Abnormal; Notable for the following components:      Result Value   CO2 19 (*)    Glucose, Bld 302 (*)    BUN 24 (*)    Creatinine, Ser 1.30 (*)    GFR, Estimated 58 (*)    All other components within normal limits  TROPONIN I (HIGH SENSITIVITY) - Abnormal; Notable for the following components:   Troponin I (High Sensitivity) 198 (*)    All other components within normal limits  TROPONIN I (HIGH SENSITIVITY) - Abnormal; Notable for the following components:   Troponin I (High Sensitivity) 178 (*)    All other components within normal limits  CBC WITH DIFFERENTIAL/PLATELET  BRAIN NATRIURETIC PEPTIDE  APTT  PROTIME-INR  LIPOPROTEIN A (LPA)  BASIC METABOLIC PANEL  LIPID  PANEL  CBC  PROTIME-INR  HEPARIN LEVEL (UNFRACTIONATED)     EKG  ED ECG REPORT I, Arta Silence, the attending physician, personally viewed and interpreted this ECG.  Date: 11/20/2021 EKG Time: 2023 Rate: 78 Rhythm: normal sinus rhythm QRS Axis: normal Intervals: RBBB ST/T Wave abnormalities: Nonspecific ST abnormalities Narrative Interpretation: no evidence of acute ischemia    RADIOLOGY  Chest x-ray: I independently viewed and interpreted the images; there is no focal consolidation or edema  PROCEDURES:  Critical Care performed: No  Procedures   MEDICATIONS ORDERED IN ED: Medications  aspirin EC tablet 81 mg (has no administration in time range)  oxyCODONE (Oxy IR/ROXICODONE) immediate release tablet 5-10 mg (has no administration in time range)  traMADol (ULTRAM) tablet 50 mg (has no administration in time range)  amLODipine (NORVASC) tablet 5 mg (has no administration in time range)  pantoprazole (PROTONIX) EC tablet 40 mg (has no administration in time range)  loratadine (CLARITIN)  tablet 10 mg (has no administration in time range)  nitroGLYCERIN (NITROSTAT) SL tablet 0.4 mg (has no administration in time range)  acetaminophen (TYLENOL) tablet 650 mg (has no administration in time range)  ondansetron (ZOFRAN) injection 4 mg (has no administration in time range)  0.9 %  sodium chloride infusion ( Intravenous New Bag/Given 11/20/21 2153)  ALPRAZolam (XANAX) tablet 0.25 mg (has no administration in time range)  atorvastatin (LIPITOR) tablet 80 mg (80 mg Oral Given 11/20/21 2145)  metoprolol tartrate (LOPRESSOR) tablet 12.5 mg (12.5 mg Oral Given 11/20/21 2145)  heparin bolus via infusion 4,000 Units (has no administration in time range)    Followed by  heparin ADULT infusion 100 units/mL (25000 units/228m) (has no administration in time range)  iohexol (OMNIPAQUE) 350 MG/ML injection 75 mL (75 mLs Intravenous Contrast Given 11/20/21 2109)  aspirin chewable tablet 324 mg (324 mg Oral Given 11/20/21 2146)    Or  aspirin suppository 300 mg ( Rectal See Alternative 11/20/21 2146)     IMPRESSION / MDM / ASSESSMENT AND PLAN / ED COURSE  I reviewed the triage vital signs and the nursing notes.  71year old male with PMH as noted above presents with exertional chest pain and shortness of breath over the last 2 weeks with an elevated troponin.  On exam the patient is overall well-appearing with normal vital signs.  The physical exam is otherwise unremarkable.  EKG does not show acute ischemic changes.  Differential diagnosis includes, but is not limited to, ACS, new onset CHF, PE.  We will obtain basic labs, cardiac enzymes, chest x-ray, CT angio of the chest, and reassess.  I anticipate admission.  Patient's presentation is most consistent with acute presentation with potential threat to life or bodily function.  The patient is on the cardiac monitor to evaluate for evidence of arrhythmia and/or significant heart rate changes.  ----------------------------------------- 11:35  PM on 11/20/2021 -----------------------------------------  Troponins are elevated.  However the patient has no active chest pain or EKG changes.  CT shows no evidence of pulmonary embolism.  I consulted Dr. MTrixie Rudefrom the hospitalist service for admission.   FINAL CLINICAL IMPRESSION(S) / ED DIAGNOSES   Final diagnoses:  [place]     Rx / DC Orders   ED Discharge Orders     None        Note:  This document was prepared using Dragon voice recognition software and may include unintentional dictation errors.    SArta Silence Samuel 11/20/21 [phone]

## 2021-11-20 NOTE — ED Notes (Signed)
Patient transported to CT 

## 2021-11-20 NOTE — ED Provider Triage Note (Signed)
Emergency Medicine Provider Triage Evaluation Note  Samuel [name] , a 71 y.o. male  was evaluated in triage.  Pt complains of chest pain, exertional shortness of breath. This has been ongoing x 2 weeks. Saw PCP and had elevated troponin, ddimer and sent to ED.Marland Kitchen  Review of Systems  Positive: CP, exertional SOB, elevated labs at PCP Negative: URI, fever, cough, peripheral edema  Physical Exam  BP 135/82   Pulse 85   Temp (!) 97.3 F (36.3 C) (Oral)   Resp 16   Ht '5\' 7"'$  (1.702 m)   Wt 96.6 kg   SpO2 96%   BMI 33.36 kg/m  Gen:   Awake, no distress   Resp:  Normal effort  MSK:   Moves extremities without difficulty  Other:    Medical Decision Making  Medically screening exam initiated at 8:02 PM.  Appropriate orders placed.  Hollie Salk was informed that the remainder of the evaluation will be completed by another provider, this initial triage assessment does not replace that evaluation, and the importance of remaining in the ED until their evaluation is complete.  Elevated ddimer, trop at PCP. Labs, chest x ray, CT at this time   Brynda Peon 11/20/21 2003

## 2021-11-20 NOTE — Consult Note (Signed)
ANTICOAGULATION CONSULT NOTE - Initial Consult  Pharmacy Consult for heparin infusion Indication: chest pain/ACS  No Known Allergies  Patient Measurements: Height: '5\' 7"'$  (170.2 cm) Weight: 97.8 kg (215 lb 9.8 oz) IBW/kg (Calculated) : 66.1 Heparin Dosing Weight: 86.8 kg   Vital Signs: Temp: 97.3 F (36.3 C) (09/25 1956) Temp Source: Oral (09/25 1956) BP: 119/80 (09/25 2130) Pulse Rate: 75 (09/25 2130)  Labs: Recent Labs    11/20/21 1744 11/20/21 1956  HGB  --  14.3  HCT  --  42.2  PLT  --  296  CREATININE  --  1.30*  TROPONINIHS 183* 198*    Estimated Creatinine Clearance: 57.2 mL/min (A) (by C-G formula based on SCr of 1.3 mg/dL (H)).   Medical History: Past Medical History:  Diagnosis Date   Anxiety    situational   Arthritis    Cancer ([place])    squamous cell carcinoma   Diabetes mellitus without complication ([place])    ED (erectile dysfunction)    GERD (gastroesophageal reflux disease)    Hyperlipidemia    Hypertension    OSA (obstructive sleep apnea)    CPAP   Stroke ([place])    no residual     Medications:  No prior AC noted   Assessment:  71 y.o. male with no prior cardiac history who presents on ED 11/20/21 after he was told he had an abnormal heart blood test earlier today. Reports over the last 2 weeks has been experiencing fatigue and chest pain intermittently.  Troponin I 183>198 Goal of Therapy:  Heparin level 0.3-0.7 units/ml Monitor platelets by anticoagulation protocol: Yes   Plan:  Give 4000 units bolus x 1 Start heparin infusion at 1200 units/hr Check anti-Xa level in 8 hours and daily while on heparin Continue to monitor H&H and platelets  Dorothe Pea, PharmD, BCPS Clinical Pharmacist   11/20/2021,9:41 PM

## 2021-11-21 ENCOUNTER — Inpatient Hospital Stay
Admit: 2021-11-21 | Discharge: 2021-11-21 | Disposition: A | Discharge status: Home / Self Care | Payer: Medicare Other | Attending: Cardiology | Admitting: Cardiology

## 2021-11-21 ENCOUNTER — Other Ambulatory Visit: Payer: Self-pay

## 2021-11-21 ENCOUNTER — Encounter
Admission: EM | Disposition: A | Discharge status: Other Acute Inpatient Hospital | Payer: Self-pay | Source: Home / Self Care | Attending: Internal Medicine

## 2021-11-21 ENCOUNTER — Encounter: Payer: Self-pay | Admitting: Family Medicine

## 2021-11-21 DIAGNOSIS — I10 Essential (primary) hypertension: Secondary | ICD-10-CM

## 2021-11-21 DIAGNOSIS — N179 Acute kidney failure, unspecified: Secondary | ICD-10-CM

## 2021-11-21 DIAGNOSIS — K219 Gastro-esophageal reflux disease without esophagitis: Secondary | ICD-10-CM

## 2021-11-21 DIAGNOSIS — E785 Hyperlipidemia, unspecified: Secondary | ICD-10-CM

## 2021-11-21 DIAGNOSIS — I214 Non-ST elevation (NSTEMI) myocardial infarction: Secondary | ICD-10-CM | POA: Diagnosis not present

## 2021-11-21 DIAGNOSIS — E1165 Type 2 diabetes mellitus with hyperglycemia: Secondary | ICD-10-CM

## 2021-11-21 HISTORY — PX: LEFT HEART CATH: CATH118248

## 2021-11-21 LAB — CBC
HCT: 39.9 % (ref 39.0–52.0)
Hemoglobin: 13.1 g/dL (ref 13.0–17.0)
MCH: 28.5 pg (ref 26.0–34.0)
MCHC: 32.8 g/dL (ref 30.0–36.0)
MCV: 86.7 fL (ref 80.0–100.0)
Platelets: 231 10*3/uL (ref 150–400)
RBC: 4.6 MIL/uL (ref 4.22–5.81)
RDW: 14.3 % (ref 11.5–15.5)
WBC: 9.1 10*3/uL (ref 4.0–10.5)
nRBC: 0 % (ref 0.0–0.2)

## 2021-11-21 LAB — GLUCOSE, CAPILLARY
Glucose-Capillary: 141 mg/dL — ABNORMAL HIGH (ref 70–99)
Glucose-Capillary: 128 mg/dL — ABNORMAL HIGH (ref 70–99)
Glucose-Capillary: 170 mg/dL — ABNORMAL HIGH (ref 70–99)
Glucose-Capillary: 200 mg/dL — ABNORMAL HIGH (ref 70–99)

## 2021-11-21 LAB — CBG MONITORING, ED: Glucose-Capillary: 160 mg/dL — ABNORMAL HIGH (ref 70–99)

## 2021-11-21 LAB — LIPID PANEL
Cholesterol: 174 mg/dL (ref 0–200)
HDL: 27 mg/dL — ABNORMAL LOW (ref >40)
LDL Cholesterol: 108 mg/dL — ABNORMAL HIGH (ref 0–99)
Total CHOL/HDL Ratio: 6.4 RATIO
Triglycerides: 197 mg/dL — ABNORMAL HIGH (ref <150)
VLDL: 39 mg/dL (ref 0–40)

## 2021-11-21 LAB — HEMOGLOBIN A1C
Hgb A1c MFr Bld: 7.3 % — ABNORMAL HIGH (ref 4.8–5.6)
Mean Plasma Glucose: 162.81 mg/dL

## 2021-11-21 LAB — BASIC METABOLIC PANEL
Anion gap: 6 (ref 5–15)
BUN: 23 mg/dL (ref 8–23)
CO2: 22 mmol/L (ref 22–32)
Calcium: 8.9 mg/dL (ref 8.9–10.3)
Chloride: 107 mmol/L (ref 98–111)
Creatinine, Ser: 0.96 mg/dL (ref 0.61–1.24)
GFR, Estimated: >60 mL/min (ref >60)
Glucose, Bld: 162 mg/dL — ABNORMAL HIGH (ref 70–99)
Potassium: 3.9 mmol/L (ref 3.5–5.1)
Sodium: 135 mmol/L (ref 135–145)

## 2021-11-21 LAB — PROTIME-INR
INR: 1.1 (ref 0.8–1.2)
Prothrombin Time: 14.1 seconds (ref 11.4–15.2)

## 2021-11-21 LAB — HEPARIN LEVEL (UNFRACTIONATED): Heparin Unfractionated: <0.1 IU/mL — ABNORMAL LOW (ref 0.30–0.70)

## 2021-11-21 SURGERY — LEFT HEART CATH
Anesthesia: Moderate Sedation

## 2021-11-21 MED ORDER — SODIUM CHLORIDE 0.9 % WEIGHT BASED INFUSION: 1.0000 mL/kg/h | INTRAVENOUS | Status: DC

## 2021-11-21 MED ORDER — SODIUM CHLORIDE 0.9 % WEIGHT BASED INFUSION
1.0000 mL/kg/h | INTRAVENOUS | Status: AC
  Administered 2021-11-21 (×2): 1 mL/kg/h via INTRAVENOUS

## 2021-11-21 MED ORDER — SODIUM CHLORIDE 0.9 % IV SOLN: 250.0000 mL | INTRAVENOUS | Status: DC | PRN

## 2021-11-21 MED ORDER — VERAPAMIL HCL 2.5 MG/ML IV SOLN
INTRAVENOUS | Status: AC
  Filled 2021-11-21: qty 2

## 2021-11-21 MED ORDER — IOHEXOL 300 MG/ML  SOLN
INTRAMUSCULAR | Status: DC | PRN
  Administered 2021-11-21: 125 mL

## 2021-11-21 MED ORDER — INSULIN ASPART 100 UNIT/ML IJ SOLN: 0.0000 [IU] | Freq: Every day | INTRAMUSCULAR | Status: DC

## 2021-11-21 MED ORDER — HEPARIN BOLUS VIA INFUSION
2600.0000 [IU] | Freq: Once | INTRAVENOUS | Status: AC
  Administered 2021-11-21: 2600 [IU] via INTRAVENOUS
  Filled 2021-11-21: qty 2600

## 2021-11-21 MED ORDER — ASPIRIN 81 MG PO CHEW
81.0000 mg | CHEWABLE_TABLET | ORAL | Status: AC
  Administered 2021-11-21: 81 mg via ORAL

## 2021-11-21 MED ORDER — ATORVASTATIN CALCIUM 80 MG PO TABS: 80.0000 mg | ORAL_TABLET | Freq: Every day | ORAL | 0 refills | Status: AC

## 2021-11-21 MED ORDER — SODIUM CHLORIDE 0.9% FLUSH: 3.0000 mL | Freq: Two times a day (BID) | INTRAVENOUS | Status: DC

## 2021-11-21 MED ORDER — HYDRALAZINE HCL 20 MG/ML IJ SOLN: 10.0000 mg | INTRAMUSCULAR | Status: AC | PRN

## 2021-11-21 MED ORDER — MIDAZOLAM HCL 2 MG/2ML IJ SOLN
INTRAMUSCULAR | Status: DC | PRN
  Administered 2021-11-21: 1 mg via INTRAVENOUS

## 2021-11-21 MED ORDER — MIDAZOLAM HCL 2 MG/2ML IJ SOLN
INTRAMUSCULAR | Status: AC
  Filled 2021-11-21: qty 2

## 2021-11-21 MED ORDER — LABETALOL HCL 5 MG/ML IV SOLN: 10.0000 mg | INTRAVENOUS | Status: AC | PRN

## 2021-11-21 MED ORDER — FENTANYL CITRATE (PF) 100 MCG/2ML IJ SOLN
INTRAMUSCULAR | Status: DC | PRN
  Administered 2021-11-21: 50 ug via INTRAVENOUS

## 2021-11-21 MED ORDER — HEPARIN (PORCINE) IN NACL 1000-0.9 UT/500ML-% IV SOLN
INTRAVENOUS | Status: DC | PRN
  Administered 2021-11-21: 1000 mL

## 2021-11-21 MED ORDER — FENTANYL CITRATE (PF) 100 MCG/2ML IJ SOLN
INTRAMUSCULAR | Status: AC
  Filled 2021-11-21: qty 2

## 2021-11-21 MED ORDER — VERAPAMIL HCL 2.5 MG/ML IV SOLN
INTRAVENOUS | Status: DC | PRN
  Administered 2021-11-21 (×3): 2.5 mg via INTRA_ARTERIAL

## 2021-11-21 MED ORDER — ACETAMINOPHEN 325 MG PO TABS: 650.0000 mg | ORAL_TABLET | ORAL | Status: DC | PRN

## 2021-11-21 MED ORDER — HEPARIN (PORCINE) 25000 UT/250ML-% IV SOLN: 1500.0000 [IU]/h | INTRAVENOUS | Status: DC

## 2021-11-21 MED ORDER — SODIUM CHLORIDE 0.9% FLUSH: 3.0000 mL | INTRAVENOUS | Status: DC | PRN

## 2021-11-21 MED ORDER — HEPARIN SODIUM (PORCINE) 1000 UNIT/ML IJ SOLN
INTRAMUSCULAR | Status: AC
  Filled 2021-11-21: qty 10

## 2021-11-21 MED ORDER — INSULIN ASPART 100 UNIT/ML IJ SOLN
0.0000 [IU] | Freq: Three times a day (TID) | INTRAMUSCULAR | Status: DC
  Administered 2021-11-21 – 2021-11-22 (×4): 2 [IU] via SUBCUTANEOUS
  Filled 2021-11-21 (×3): qty 1

## 2021-11-21 MED ORDER — LIDOCAINE HCL (PF) 1 % IJ SOLN
INTRAMUSCULAR | Status: DC | PRN
  Administered 2021-11-21: 2 mL
  Administered 2021-11-21: 18 mL

## 2021-11-21 MED ORDER — HEPARIN (PORCINE) IN NACL 1000-0.9 UT/500ML-% IV SOLN
INTRAVENOUS | Status: AC
  Filled 2021-11-21: qty 1000

## 2021-11-21 MED ORDER — ASPIRIN 81 MG PO CHEW
CHEWABLE_TABLET | ORAL | Status: AC
  Filled 2021-11-21: qty 1

## 2021-11-21 MED ORDER — ONDANSETRON HCL 4 MG/2ML IJ SOLN: 4.0000 mg | Freq: Four times a day (QID) | INTRAMUSCULAR | Status: DC | PRN

## 2021-11-21 MED ORDER — LIDOCAINE HCL 1 % IJ SOLN
INTRAMUSCULAR | Status: AC
  Filled 2021-11-21: qty 20

## 2021-11-21 MED ORDER — HEPARIN (PORCINE) 25000 UT/250ML-% IV SOLN
1700.0000 [IU]/h | INTRAVENOUS | Status: DC
  Administered 2021-11-21: 1200 [IU]/h via INTRAVENOUS
  Administered 2021-11-22: 1700 [IU]/h via INTRAVENOUS
  Filled 2021-11-21 (×2): qty 250

## 2021-11-21 MED ORDER — SODIUM CHLORIDE 0.9% FLUSH
3.0000 mL | Freq: Two times a day (BID) | INTRAVENOUS | Status: DC
  Administered 2021-11-21: 3 mL via INTRAVENOUS

## 2021-11-21 MED ORDER — SODIUM CHLORIDE 0.9 % WEIGHT BASED INFUSION
3.0000 mL/kg/h | INTRAVENOUS | Status: AC
  Administered 2021-11-21: 3 mL/kg/h via INTRAVENOUS

## 2021-11-21 MED ORDER — HEPARIN SODIUM (PORCINE) 1000 UNIT/ML IJ SOLN
INTRAMUSCULAR | Status: DC | PRN
  Administered 2021-11-21: 5000 [IU] via INTRAVENOUS

## 2021-11-21 SURGICAL SUPPLY — 21 items
BAND CMPR LRG ZPHR (HEMOSTASIS) ×1
BAND ZEPHYR COMPRESS 30 LONG (HEMOSTASIS) IMPLANT
CATH 5FR JL3.5 JR4 ANG PIG MP (CATHETERS) IMPLANT
CATH INFINITI 5FR JL5 (CATHETERS) IMPLANT
DEVICE CLOSURE MYNXGRIP 5F (Vascular Products) IMPLANT
DEVICE SAFEGUARD 24CM (GAUZE/BANDAGES/DRESSINGS) IMPLANT
DRAPE BRACHIAL (DRAPES) IMPLANT
GLIDESHEATH SLEND SS 6F .021 (SHEATH) IMPLANT
GUIDEWIRE INQWIRE 1.5J.035X260 (WIRE) IMPLANT
INQWIRE 1.5J .035X260CM (WIRE) ×1
KIT SYRINGE INJ CVI SPIKEX1 (MISCELLANEOUS) IMPLANT
NDL PERC 18GX7CM (NEEDLE) IMPLANT
NEEDLE PERC 18GX7CM (NEEDLE) ×1 IMPLANT
PACK CARDIAC CATH (CUSTOM PROCEDURE TRAY) ×1 IMPLANT
PROTECTION STATION PRESSURIZED (MISCELLANEOUS) ×1
SET ATX SIMPLICITY (MISCELLANEOUS) IMPLANT
SHEATH 6FR 85 DEST SLENDER (SHEATH) IMPLANT
SHEATH AVANTI 5FR X 11CM (SHEATH) IMPLANT
STATION PROTECTION PRESSURIZED (MISCELLANEOUS) IMPLANT
WIRE GUIDERIGHT .035X150 (WIRE) IMPLANT
WIRE HITORQ VERSACORE ST 145CM (WIRE) IMPLANT

## 2021-11-21 NOTE — Assessment & Plan Note (Signed)
-   She will be on high-dose statin therapy.

## 2021-11-21 NOTE — Assessment & Plan Note (Signed)
-   The patient will be hydrated with IV normal saline.  This is likely prerenal. - We will avoid nephrotoxins. - We will follow BMP.

## 2021-11-21 NOTE — Consult Note (Signed)
ANTICOAGULATION CONSULT NOTE - Initial Consult  Pharmacy Consult for heparin infusion Indication: chest pain/ACS  No Known Allergies  Patient Measurements: Height: '5\' 7"'$  (170.2 cm) Weight: 96.6 kg (213 lb) IBW/kg (Calculated) : 66.1 Heparin Dosing Weight: 86.8 kg   Vital Signs: Temp: 98.4 F (36.9 C) (09/26 1056) Temp Source: Oral (09/26 1056) BP: 135/85 (09/26 1500) Pulse Rate: 70 (09/26 1500)  Labs: Recent Labs    11/20/21 1744 11/20/21 1956 11/20/21 2156 11/21/21 0548  HGB  --  14.3  --  13.1  HCT  --  42.2  --  39.9  PLT  --  296  --  231  APTT  --   --  32  --   LABPROT  --   --  13.7 14.1  INR  --   --  1.1 1.1  HEPARINUNFRC  --   --   --  <0.10*  CREATININE  --  1.30*  --  0.96  TROPONINIHS 183* 198* 178*  --      Estimated Creatinine Clearance: 77 mL/min (by C-G formula based on SCr of 0.96 mg/dL).   Medical History: Past Medical History:  Diagnosis Date   Anxiety    situational   Arthritis    Cancer ([place])    squamous cell carcinoma   Diabetes mellitus without complication ([place])    ED (erectile dysfunction)    GERD (gastroesophageal reflux disease)    Hyperlipidemia    Hypertension    OSA (obstructive sleep apnea)    CPAP   Stroke ([place])    no residual     Medications:  No prior AC noted   Assessment:  71 y.o. male with no prior cardiac history who presents on ED 11/20/21 after he was told he had an abnormal heart blood test earlier today. Reports over the last 2 weeks has been experiencing fatigue and chest pain intermittently.  Troponin I 183>198 Goal of Therapy:  Heparin level 0.3-0.7 units/ml Monitor platelets by anticoagulation protocol: Yes   Plan:  - Per Cath lab team, patient developed hematoma during cardiac catheterization and heparin was stopped~1400. Spoke with nurse, will continue to hold heparin until 1600, and restart heparin infusion at 1200 units/hr - NO BOLUSES to prevent recurrence of hematoma while pending transfer to  [facility] for CABG. - Will recheck HL 8 hrs after rate change.  - CBC daily  Pearla Dubonnet, PharmD Clinical Pharmacist   11/21/2021,3:44 PM

## 2021-11-21 NOTE — Assessment & Plan Note (Addendum)
-   The patient be admitted to a cardiac telemetry bed. - We will follow serial troponins.   - We will continue on aspirin and place him on as as needed sublingual nitroglycerin and morphine sulfate for pain. - We will continue IV heparin. - High-dose statin therapy will be provided. - We will add small dose beta-blocker therapy. - 2D echo and cardiology consult will be obtained. - I notified Dr. Saralyn Pilar about the patient.

## 2021-11-21 NOTE — Consult Note (Signed)
[facility] Cardiology  CARDIOLOGY CONSULT NOTE  Patient ID: Samuel [name] MRN: [id number] DOB/AGE: April 26, 1950 71 y.o.  Admit date: 11/20/2021 Referring Physician Posey Pronto Primary Physician [facility] Primary Cardiologist Toria Monte Reason for Consultation NSTEMI  HPI: 71 year old gentleman referred for evaluation of NSTEMI.  She has multiple cardiovascular risk factors including essential hypertension, hyperlipidemia and type 2 diabetes.  Patient first noted midsternal chest discomfort approximately 2 weeks ago after doing yard work.  Since then, the patient has noted recurring episodes of chest pain with exertion such as walking up an incline.  The patient saw his primary care provider yesterday at which time ECG revealed sinus rhythm with right bundle branch block without acute ischemic ST-T wave changes and was scheduled to see my  PA Clabe Seal later this morning.  However, blood work revealed elevated troponin (183, 189, 178) and the patient was sent to [facility] ED where he was placed on heparin infusion, and has remained chest pain-free.  Show BUN/creatinine are 24 and 1.30, improved after IV fluids to 23 and 0.96, respectively.  Review of systems complete and found to be negative unless listed above     Past Medical History:  Diagnosis Date   Anxiety    situational   Arthritis    Cancer ([place])    squamous cell carcinoma   Diabetes mellitus without complication ([place])    ED (erectile dysfunction)    GERD (gastroesophageal reflux disease)    Hyperlipidemia    Hypertension    OSA (obstructive sleep apnea)    CPAP   Stroke ([place])    no residual     Past Surgical History:  Procedure Laterality Date   COLONOSCOPY     KNEE ARTHROPLASTY Left 03/20/2019   Procedure: LEFT COMPUTER ASSISTED TOTAL KNEE ARTHROPLASTY;  Surgeon: Dereck Leep, MD;  Location: [facility] ORS;  Service: Orthopedics;  Laterality: Left;   KNEE ARTHROPLASTY Right 11/09/2019   Procedure: COMPUTER ASSISTED TOTAL KNEE ARTHROPLASTY;   Surgeon: Dereck Leep, MD;  Location: [facility] ORS;  Service: Orthopedics;  Laterality: Right;   KNEE ARTHROSCOPY Left    SHOULDER ARTHROSCOPY Right 2009   rotator cuff   TONSILLECTOMY      (Not in a hospital admission)  Social History   Socioeconomic History   Marital status: Married    Spouse name: Not on file   Number of children: Not on file   Years of education: Not on file   Highest education level: Not on file  Occupational History   Not on file  Tobacco Use   Smoking status: Never   Smokeless tobacco: Never  Vaping Use   Vaping Use: Never used  Substance and Sexual Activity   Alcohol use: Yes    Alcohol/week: 2.0 - 3.0 standard drinks of alcohol    Types: 2 - 3 Standard drinks or equivalent per week   Drug use: Never   Sexual activity: Not on file  Other Topics Concern   Not on file  Social History Narrative   Not on file   Social Determinants of Health   Financial Resource Strain: Not on file  Food Insecurity: Not on file  Transportation Needs: Not on file  Physical Activity: Not on file  Stress: Not on file  Social Connections: Not on file  Intimate Partner Violence: Not on file    History reviewed. No pertinent family history.    Review of systems complete and found to be negative unless listed above      PHYSICAL EXAM  General:  Well developed, well nourished, in no acute distress HEENT:  Normocephalic and atramatic Neck:  No JVD.  Lungs: Clear bilaterally to auscultation and percussion. Heart: HRRR . Normal S1 and S2 without gallops or murmurs.  Abdomen: Bowel sounds are positive, abdomen soft and non-tender  Msk:  Back normal, normal gait. Normal strength and tone for age. Extremities: No clubbing, cyanosis or edema.   Neuro: Alert and oriented X 3. Psych:  Good affect, responds appropriately  Labs:   Lab Results  Component Value Date   WBC 9.1 11/21/2021   HGB 13.1 11/21/2021   HCT 39.9 11/21/2021   MCV 86.7 11/21/2021   PLT 231  11/21/2021    Recent Labs  Lab 11/20/21 1956 11/21/21 0548  NA 137 135  K 4.3 3.9  CL 103 107  CO2 19* 22  BUN 24* 23  CREATININE 1.30* 0.96  CALCIUM 9.7 8.9  PROT 7.9  --   BILITOT 0.6  --   ALKPHOS 65  --   ALT 17  --   AST 27  --   GLUCOSE 302* 162*   No results found for: "CKTOTAL", "CKMB", "CKMBINDEX", "TROPONINI"  Lab Results  Component Value Date   CHOL 174 11/21/2021   Lab Results  Component Value Date   HDL 27 (L) 11/21/2021   Lab Results  Component Value Date   LDLCALC 108 (H) 11/21/2021   Lab Results  Component Value Date   TRIG 197 (H) 11/21/2021   Lab Results  Component Value Date   CHOLHDL 6.4 11/21/2021   No results found for: "LDLDIRECT"    Radiology: CT Angio Chest PE W/Cm &/Or Wo Cm  Result Date: 11/20/2021 CLINICAL DATA:  Pulmonary embolism (PE) suspected, positive D-dimer EXAM: CT ANGIOGRAPHY CHEST WITH CONTRAST TECHNIQUE: Multidetector CT imaging of the chest was performed using the standard protocol during bolus administration of intravenous contrast. Multiplanar CT image reconstructions and MIPs were obtained to evaluate the vascular anatomy. RADIATION DOSE REDUCTION: This exam was performed according to the departmental dose-optimization program which includes automated exposure control, adjustment of the mA and/or kV according to patient size and/or use of iterative reconstruction technique. CONTRAST:  39m OMNIPAQUE IOHEXOL 350 MG/ML SOLN COMPARISON:  None Available. FINDINGS: Cardiovascular: No filling defects in the pulmonary arteries to suggest pulmonary emboli. Heart is normal size. Aorta is normal caliber. Scattered coronary artery and aortic calcifications. Mediastinum/Nodes: No mediastinal, hilar, or axillary adenopathy. Trachea and esophagus are unremarkable. Thyroid unremarkable. Lungs/Pleura: No confluent opacities or effusions. Upper Abdomen: No acute findings Musculoskeletal: Chest wall soft tissues are unremarkable. No acute bony  abnormality. Review of the MIP images confirms the above findings. IMPRESSION: No evidence of pulmonary embolus. No acute cardiopulmonary disease. Coronary artery disease. Aortic Atherosclerosis (ICD10-I70.0). Electronically Signed   By: KRolm BaptiseM.D.   On: 11/20/2021 21:21   DG Chest 2 View  Result Date: 11/20/2021 CLINICAL DATA:  Chest pain EXAM: CHEST - 2 VIEW COMPARISON:  None available FINDINGS: No focal consolidation, pleural effusion, or pneumothorax. Bibasilar atelectasis/scarring. Normal cardiomediastinal silhouette. No acute osseous abnormality. Aortic atherosclerotic calcification. Postoperative changes right humeral head IMPRESSION: No active cardiopulmonary disease. Electronically Signed   By: TPlacido SouM.D.   On: 11/20/2021 20:20    EKG: Sinus arrhythmia at 78 bpm with right bundle branch block  ASSESSMENT AND PLAN:   1.  NSTEMI, with new onset chest pain and crescendo pattern, with elevated high-sensitivity troponin, chest pain-free on heparin infusion 2.  AKI, improved after IV hydration 3.  Essential hypertension, blood pressure well controlled on metoprolol tartrate and amlodipine 4.  Hyperlipidemia, on high intensity atorvastatin 5.  Type 2 diabetes, on insulin  Recommendations  1.  Agree with current therapy 2.  Agree with heparin infusion 3.  Proceed with cardiac catheterization with selective coronary arteriography.  The risk, benefits and alternatives of cardiac catheterization and possible PCI were explained to the patient and informed written consent was obtained.  Signed: Isaias Cowman MD,PhD, [facility] 11/21/2021, 7:47 AM

## 2021-11-21 NOTE — Consult Note (Signed)
ANTICOAGULATION CONSULT NOTE - Initial Consult  Pharmacy Consult for heparin infusion Indication: chest pain/ACS  No Known Allergies  Patient Measurements: Height: '5\' 7"'$  (170.2 cm) Weight: 97.8 kg (215 lb 9.8 oz) IBW/kg (Calculated) : 66.1 Heparin Dosing Weight: 86.8 kg   Vital Signs: Temp: 98 F (36.7 C) (09/26 0546) Temp Source: Oral (09/26 0546) BP: 133/87 (09/26 0600) Pulse Rate: 62 (09/26 0600)  Labs: Recent Labs    11/20/21 1744 11/20/21 1956 11/20/21 2156 11/21/21 0548  HGB  --  14.3  --  13.1  HCT  --  42.2  --  39.9  PLT  --  296  --  231  APTT  --   --  32  --   LABPROT  --   --  13.7 14.1  INR  --   --  1.1 1.1  HEPARINUNFRC  --   --   --  <0.10*  CREATININE  --  1.30*  --  0.96  TROPONINIHS 183* 198* 178*  --      Estimated Creatinine Clearance: 77.5 mL/min (by C-G formula based on SCr of 0.96 mg/dL).   Medical History: Past Medical History:  Diagnosis Date   Anxiety    situational   Arthritis    Cancer ([place])    squamous cell carcinoma   Diabetes mellitus without complication ([place])    ED (erectile dysfunction)    GERD (gastroesophageal reflux disease)    Hyperlipidemia    Hypertension    OSA (obstructive sleep apnea)    CPAP   Stroke ([place])    no residual     Medications:  No prior AC noted   Assessment:  71 y.o. male with no prior cardiac history who presents on ED 11/20/21 after he was told he had an abnormal heart blood test earlier today. Reports over the last 2 weeks has been experiencing fatigue and chest pain intermittently.  Troponin I 183>198 Goal of Therapy:  Heparin level 0.3-0.7 units/ml Monitor platelets by anticoagulation protocol: Yes   Plan:  9/26:  HL @ 0548 = < 0.1, SUBtherapeutic - Multiple restarts noted on eMar, spoke with RN who confirmed heparin gtt had not been turned off for significant period of time. - Will order heparin 2600 units IV X 1 and increase drip rate to 1500 units/hr. - Will recheck HL 8 hrs  after rate change.   Orene Desanctis, PharmD Clinical Pharmacist   11/21/2021,6:52 AM

## 2021-11-21 NOTE — Assessment & Plan Note (Signed)
-   We will continue amlodipine and hold off Zestoretic for now given acute kidney injury.

## 2021-11-21 NOTE — Hospital Course (Signed)
PMH of OSA on CPAP, GERD, HTN, type II DM, HLD presented to the hospital with complaints of substernal chest pain with tightness radiating to the left arm.  Started to have symptoms of exertional dyspnea ongoing for last 10 days progressively worsening.  Found to have elevated troponins.  No significant EKG changes.  Cardiology was consulted.  Underwent cardiac catheterization.  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  Currently being transferred for CABG evaluation.

## 2021-11-21 NOTE — Progress Notes (Signed)
PT placed on CPAP/hospital unit for HS, on Auto-set 6-20 cm and 21% room air. SpO2 97%, tolerating well. PT home settings unknown.

## 2021-11-21 NOTE — Assessment & Plan Note (Signed)
-   We will continue PPI. 

## 2021-11-21 NOTE — Assessment & Plan Note (Signed)
-   The patient will be with placed on supplement coverage with NovoLog. - We will hold off metformin.

## 2021-11-21 NOTE — Discharge Summary (Signed)
Physician Discharge Summary   Patient: Samuel [name] MRN: [id number] DOB: Feb 25, 1951  Admit date:     11/20/2021  Discharge date: 11/21/21  Discharge Physician: Berle Mull  PCP: Kirk Ruths, MD  Recommendations at discharge: Currently being transferred to [facility] for cardiothoracic surgery evaluation for CABG.  Under cardiology service.   Follow-up Information     Kirk Ruths, MD. Schedule an appointment as soon as possible for a visit in 2 week(s).   Specialty: Internal Medicine Contact information: [place] [address] [phone]                Discharge Diagnoses: Principal Problem:   NSTEMI (non-ST elevated myocardial infarction) [facility]) Active Problems:   AKI (acute kidney injury) ([place])   Uncontrolled type 2 diabetes mellitus with hyperglycemia, without long-term current use of insulin ([place])   Obstructive sleep apnea   Dyslipidemia   Essential hypertension   GERD without esophagitis  Hospital Course: PMH of OSA on CPAP, GERD, HTN, type II DM, HLD presented to the hospital with complaints of substernal chest pain with tightness radiating to the left arm.  Started to have symptoms of exertional dyspnea ongoing for last 10 days progressively worsening.  Found to have elevated troponins.  No significant EKG changes.  Cardiology was consulted.  Underwent cardiac catheterization.  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  Currently being transferred for CABG evaluation. Assessment and Plan  NSTEMI (non-ST elevated myocardial infarction)  Severe triple-vessel CAD Elevated troponins Presents with complaints of exertional dyspnea ongoing for last 10 days and now having substernal chest pain when chest tightness. EKG unremarkable for acute ACS. Troponins minimally elevated. Underwent cardiac catheterization shows triple-vessel disease with left main disease. Patient will require cardiothoracic  surgery evaluation for CABG. Currently being transferred to [place] with IV heparin for now. LVEF 55 to 65%. Appreciate [facility] cardiology.   AKI (acute kidney injury) ([place]) Baseline serum creatinine appears to be around 1. On presentation serum creatinine 1.30. Currently improving to 0.96. Monitor for cardiac catheterization. Receiving IV hydration.   Uncontrolled type 2 diabetes mellitus with hyperglycemia, without long-term current use of insulin (HCC) Hemoglobin A1c 7.3. Currently on sliding scale insulin. Holding metformin in the setting of cardiac catheterization. Will require somewhat aggressive control on discharge.   GERD without esophagitis Continue PPI.   Essential hypertension Blood pressure stable. Continue current regimen. On lisinopril HCTZ currently on hold.   Hyperlipidemia LDL 108. Goal likely less than 70. Initiate Lipitor 80 mg.   Obstructive sleep apnea Continue CPAP nightly.  Obesity Body mass index is 33.36 kg/m.  Placing the pt at higher risk of poor outcomes.  Consultants:  Cardiology  Procedures performed:  Left heart cath  DISCHARGE MEDICATION: Allergies as of 11/21/2021   No Known Allergies      Medication List     STOP taking these medications    lisinopril-hydrochlorothiazide 20-25 MG tablet Commonly known as: ZESTORETIC   metFORMIN 500 MG 24 hr tablet Commonly known as: GLUCOPHAGE-XR   pravastatin 40 MG tablet Commonly known as: PRAVACHOL       TAKE these medications    amLODipine 5 MG tablet Commonly known as: NORVASC Take 5 mg by mouth daily.   aspirin EC 81 MG tablet Take 81 mg by mouth daily.   atorvastatin 80 MG tablet Commonly known as: Lipitor Take 1 tablet (80 mg total) by mouth daily.   heparin 25000 UT/250ML infusion  Inject 1,500 Units/hr into the vein continuous.   loratadine 10 MG tablet Commonly known as: CLARITIN Take 10 mg by mouth daily.   nitroGLYCERIN 0.4 MG SL  tablet Commonly known as: NITROSTAT Place under the tongue.   omeprazole 20 MG capsule Commonly known as: PRILOSEC Take 20 mg by mouth daily before breakfast.   oxybutynin 5 MG 24 hr tablet Commonly known as: DITROPAN-XL Take 5 mg by mouth daily.       Disposition: Outside Hospital Diet recommendation: Cardiac diet  Discharge Exam: Vitals:   11/21/21 1030 11/21/21 1056 11/21/21 1315 11/21/21 1330  BP: 111/78 133/82 (!) 156/95 (!) 154/98  Pulse: 62 67 80 80  Resp: '16 15 20 20  '$ Temp:  98.4 F (36.9 C)    TempSrc:  Oral    SpO2: 94% 95% 93% 93%  Weight:  96.6 kg    Height:  '5\' 7"'$  (1.702 m)     General: Appear in no distress; no visible Abnormal Neck Mass Or lumps, Conjunctiva normal Cardiovascular: S1 and S2 Present, no Murmur, Respiratory: good respiratory effort, Bilateral Air entry present and CTA, no Crackles, no wheezes Abdomen: Bowel Sound present, Non tender  Extremities: no Pedal edema Neurology: alert and oriented to time, place, and person  Filed Weights   11/20/21 1957 11/20/21 2013 11/21/21 1056  Weight: 96.6 kg 97.8 kg 96.6 kg   Condition at discharge: stable  The results of significant diagnostics from this hospitalization (including imaging, microbiology, ancillary and laboratory) are listed below for reference.   Imaging Studies: CARDIAC CATHETERIZATION  Result Date: 11/21/2021   Ost LM to Mid LM lesion is 80% stenosed.   Ost Cx to Prox Cx lesion is 90% stenosed.   Prox Cx to Mid Cx lesion is 80% stenosed.   1st Diag lesion is 60% stenosed.   Ost RCA to Prox RCA lesion is 90% stenosed.   Prox RCA lesion is 40% stenosed.   Mid RCA to Dist RCA lesion is 80% stenosed.   Dist RCA lesion is 60% stenosed.   The left ventricular systolic function is normal.   LV end diastolic pressure is moderately elevated.   The left ventricular ejection fraction is 55-65% by visual estimate. 1.  NSTEMI 2.  Left main and three-vessel coronary artery disease 80% stenosis left  main, 60% stenosis D1, 90% stenosis ostial left circumflex, 90% stenosis ostial RCA, 80% stenosis mid to distal RCA 3.  Preserved left ventricular function Recommendations CABG   CT Angio Chest PE W/Cm &/Or Wo Cm  Result Date: 11/20/2021 CLINICAL DATA:  Pulmonary embolism (PE) suspected, positive D-dimer EXAM: CT ANGIOGRAPHY CHEST WITH CONTRAST TECHNIQUE: Multidetector CT imaging of the chest was performed using the standard protocol during bolus administration of intravenous contrast. Multiplanar CT image reconstructions and MIPs were obtained to evaluate the vascular anatomy. RADIATION DOSE REDUCTION: This exam was performed according to the departmental dose-optimization program which includes automated exposure control, adjustment of the mA and/or kV according to patient size and/or use of iterative reconstruction technique. CONTRAST:  90m OMNIPAQUE IOHEXOL 350 MG/ML SOLN COMPARISON:  None Available. FINDINGS: Cardiovascular: No filling defects in the pulmonary arteries to suggest pulmonary emboli. Heart is normal size. Aorta is normal caliber. Scattered coronary artery and aortic calcifications. Mediastinum/Nodes: No mediastinal, hilar, or axillary adenopathy. Trachea and esophagus are unremarkable. Thyroid unremarkable. Lungs/Pleura: No confluent opacities or effusions. Upper Abdomen: No acute findings Musculoskeletal: Chest wall soft tissues are unremarkable. No acute bony abnormality. Review of the MIP images confirms the  above findings. IMPRESSION: No evidence of pulmonary embolus. No acute cardiopulmonary disease. Coronary artery disease. Aortic Atherosclerosis (ICD10-I70.0). Electronically Signed   By: Rolm Baptise M.D.   On: 11/20/2021 21:21   DG Chest 2 View  Result Date: 11/20/2021 CLINICAL DATA:  Chest pain EXAM: CHEST - 2 VIEW COMPARISON:  None available FINDINGS: No focal consolidation, pleural effusion, or pneumothorax. Bibasilar atelectasis/scarring. Normal cardiomediastinal silhouette.  No acute osseous abnormality. Aortic atherosclerotic calcification. Postoperative changes right humeral head IMPRESSION: No active cardiopulmonary disease. Electronically Signed   By: Placido Sou M.D.   On: 11/20/2021 20:20    Microbiology: Results for orders placed or performed during the hospital encounter of 11/05/19  SARS CORONAVIRUS 2 (TAT 6-24 HRS) Nasopharyngeal Nasopharyngeal Swab     Status: None   Collection Time: 11/05/19 12:10 PM   Specimen: Nasopharyngeal Swab  Result Value Ref Range Status   SARS Coronavirus 2 NEGATIVE NEGATIVE Final    Comment: (NOTE) SARS-CoV-2 target nucleic acids are NOT DETECTED.  The SARS-CoV-2 RNA is generally detectable in upper and lower respiratory specimens during the acute phase of infection. Negative results do not preclude SARS-CoV-2 infection, do not rule out co-infections with other pathogens, and should not be used as the sole basis for treatment or other patient management decisions. Negative results must be combined with clinical observations, patient history, and epidemiological information. The expected result is Negative.  Fact Sheet for Patients: SugarRoll.be  Fact Sheet for Healthcare Providers: https://www.woods-mathews.com/  This test is not yet approved or cleared by the [place] FDA and  has been authorized for detection and/or diagnosis of SARS-CoV-2 by FDA under an Emergency Use Authorization (EUA). This EUA will remain  in effect (meaning this test can be used) for the duration of the COVID-19 declaration under Se ction 564(b)(1) of the Act, 21 U.S.C. section 360bbb-3(b)(1), unless the authorization is terminated or revoked sooner.  Performed at [facility], [place] [address] [place], [place] [address]    Labs: CBC: Recent Labs  Lab 11/20/21 1956 11/21/21 0548  WBC 10.1 9.1  NEUTROABS 6.3  --   HGB 14.3 13.1  HCT 42.2 39.9  MCV 86.7 86.7  PLT 296 644    Basic Metabolic Panel: Recent Labs  Lab 11/20/21 1956 11/21/21 0548  NA 137 135  K 4.3 3.9  CL 103 107  CO2 19* 22  GLUCOSE 302* 162*  BUN 24* 23  CREATININE 1.30* 0.96  CALCIUM 9.7 8.9   Liver Function Tests: Recent Labs  Lab 11/20/21 1956  AST 27  ALT 17  ALKPHOS 65  BILITOT 0.6  PROT 7.9  ALBUMIN 3.9   CBG: Recent Labs  Lab 11/21/21 0930 11/21/21 1126  GLUCAP 160* 141*    Discharge time spent: greater than 30 minutes.  Signed: Berle Mull, MD [facility]

## 2021-11-21 NOTE — Assessment & Plan Note (Signed)
We will continue CPAP nightly. 

## 2021-11-22 ENCOUNTER — Inpatient Hospital Stay (HOSPITAL_COMMUNITY)
Admission: AD | Admit: 2021-11-22 | Discharge: 2021-11-29 | DRG: 236 | Disposition: A | Discharge status: Home / Self Care | Payer: Medicare Other | Source: Other Acute Inpatient Hospital | Attending: Thoracic Surgery (Cardiothoracic Vascular Surgery) | Admitting: Thoracic Surgery (Cardiothoracic Vascular Surgery)

## 2021-11-22 ENCOUNTER — Encounter: Payer: Self-pay | Admitting: Cardiology

## 2021-11-22 ENCOUNTER — Other Ambulatory Visit (HOSPITAL_COMMUNITY): Payer: Medicare Other

## 2021-11-22 ENCOUNTER — Encounter (HOSPITAL_COMMUNITY): Payer: Self-pay

## 2021-11-22 DIAGNOSIS — Z96653 Presence of artificial knee joint, bilateral: Secondary | ICD-10-CM | POA: Diagnosis present

## 2021-11-22 DIAGNOSIS — I6523 Occlusion and stenosis of bilateral carotid arteries: Secondary | ICD-10-CM | POA: Clinically undetermined

## 2021-11-22 DIAGNOSIS — Z8249 Family history of ischemic heart disease and other diseases of the circulatory system: Secondary | ICD-10-CM

## 2021-11-22 DIAGNOSIS — J811 Chronic pulmonary edema: Secondary | ICD-10-CM | POA: Diagnosis present

## 2021-11-22 DIAGNOSIS — I10 Essential (primary) hypertension: Secondary | ICD-10-CM | POA: Diagnosis present

## 2021-11-22 DIAGNOSIS — Z79899 Other long term (current) drug therapy: Secondary | ICD-10-CM | POA: Diagnosis not present

## 2021-11-22 DIAGNOSIS — E871 Hypo-osmolality and hyponatremia: Secondary | ICD-10-CM | POA: Diagnosis not present

## 2021-11-22 DIAGNOSIS — K5903 Drug induced constipation: Secondary | ICD-10-CM | POA: Diagnosis not present

## 2021-11-22 DIAGNOSIS — I2101 ST elevation (STEMI) myocardial infarction involving left main coronary artery: Secondary | ICD-10-CM | POA: Diagnosis not present

## 2021-11-22 DIAGNOSIS — Z9989 Dependence on other enabling machines and devices: Secondary | ICD-10-CM

## 2021-11-22 DIAGNOSIS — I252 Old myocardial infarction: Secondary | ICD-10-CM

## 2021-11-22 DIAGNOSIS — Z23 Encounter for immunization: Secondary | ICD-10-CM | POA: Diagnosis present

## 2021-11-22 DIAGNOSIS — Z5320 Procedure and treatment not carried out because of patient's decision for unspecified reasons: Secondary | ICD-10-CM | POA: Diagnosis not present

## 2021-11-22 DIAGNOSIS — D62 Acute posthemorrhagic anemia: Secondary | ICD-10-CM | POA: Diagnosis not present

## 2021-11-22 DIAGNOSIS — I25119 Atherosclerotic heart disease of native coronary artery with unspecified angina pectoris: Secondary | ICD-10-CM | POA: Diagnosis not present

## 2021-11-22 DIAGNOSIS — E1169 Type 2 diabetes mellitus with other specified complication: Secondary | ICD-10-CM | POA: Diagnosis not present

## 2021-11-22 DIAGNOSIS — Z7984 Long term (current) use of oral hypoglycemic drugs: Secondary | ICD-10-CM

## 2021-11-22 DIAGNOSIS — I25110 Atherosclerotic heart disease of native coronary artery with unstable angina pectoris: Secondary | ICD-10-CM | POA: Diagnosis present

## 2021-11-22 DIAGNOSIS — T502X5A Adverse effect of carbonic-anhydrase inhibitors, benzothiadiazides and other diuretics, initial encounter: Secondary | ICD-10-CM | POA: Diagnosis not present

## 2021-11-22 DIAGNOSIS — E876 Hypokalemia: Secondary | ICD-10-CM | POA: Diagnosis not present

## 2021-11-22 DIAGNOSIS — I214 Non-ST elevation (NSTEMI) myocardial infarction: Principal | ICD-10-CM | POA: Diagnosis present

## 2021-11-22 DIAGNOSIS — K5900 Constipation, unspecified: Secondary | ICD-10-CM | POA: Diagnosis not present

## 2021-11-22 DIAGNOSIS — E8770 Fluid overload, unspecified: Secondary | ICD-10-CM | POA: Diagnosis not present

## 2021-11-22 DIAGNOSIS — E669 Obesity, unspecified: Secondary | ICD-10-CM | POA: Diagnosis present

## 2021-11-22 DIAGNOSIS — N179 Acute kidney failure, unspecified: Secondary | ICD-10-CM | POA: Diagnosis present

## 2021-11-22 DIAGNOSIS — K219 Gastro-esophageal reflux disease without esophagitis: Secondary | ICD-10-CM | POA: Diagnosis present

## 2021-11-22 DIAGNOSIS — E785 Hyperlipidemia, unspecified: Secondary | ICD-10-CM | POA: Diagnosis not present

## 2021-11-22 DIAGNOSIS — Z6832 Body mass index (BMI) 32.0-32.9, adult: Secondary | ICD-10-CM | POA: Diagnosis not present

## 2021-11-22 DIAGNOSIS — M1990 Unspecified osteoarthritis, unspecified site: Secondary | ICD-10-CM | POA: Diagnosis present

## 2021-11-22 DIAGNOSIS — G4733 Obstructive sleep apnea (adult) (pediatric): Secondary | ICD-10-CM | POA: Diagnosis not present

## 2021-11-22 DIAGNOSIS — Z9911 Dependence on respirator [ventilator] status: Secondary | ICD-10-CM | POA: Diagnosis not present

## 2021-11-22 DIAGNOSIS — J9811 Atelectasis: Secondary | ICD-10-CM | POA: Diagnosis not present

## 2021-11-22 DIAGNOSIS — G4730 Sleep apnea, unspecified: Secondary | ICD-10-CM | POA: Diagnosis not present

## 2021-11-22 DIAGNOSIS — I4510 Unspecified right bundle-branch block: Secondary | ICD-10-CM | POA: Diagnosis present

## 2021-11-22 DIAGNOSIS — I2510 Atherosclerotic heart disease of native coronary artery without angina pectoris: Secondary | ICD-10-CM | POA: Diagnosis not present

## 2021-11-22 DIAGNOSIS — E119 Type 2 diabetes mellitus without complications: Secondary | ICD-10-CM | POA: Diagnosis not present

## 2021-11-22 DIAGNOSIS — I700 Atherosclerosis of aorta: Secondary | ICD-10-CM | POA: Diagnosis present

## 2021-11-22 DIAGNOSIS — R739 Hyperglycemia, unspecified: Secondary | ICD-10-CM | POA: Diagnosis not present

## 2021-11-22 DIAGNOSIS — Z7982 Long term (current) use of aspirin: Secondary | ICD-10-CM

## 2021-11-22 DIAGNOSIS — Z951 Presence of aortocoronary bypass graft: Principal | ICD-10-CM

## 2021-11-22 DIAGNOSIS — Z01810 Encounter for preprocedural cardiovascular examination: Secondary | ICD-10-CM | POA: Diagnosis not present

## 2021-11-22 DIAGNOSIS — E1165 Type 2 diabetes mellitus with hyperglycemia: Secondary | ICD-10-CM | POA: Diagnosis present

## 2021-11-22 DIAGNOSIS — F419 Anxiety disorder, unspecified: Secondary | ICD-10-CM | POA: Diagnosis present

## 2021-11-22 HISTORY — DX: Type 2 diabetes mellitus with hyperglycemia: E11.65

## 2021-11-22 LAB — BASIC METABOLIC PANEL
Anion gap: 9 (ref 5–15)
BUN: 16 mg/dL (ref 8–23)
CO2: 22 mmol/L (ref 22–32)
Calcium: 8.9 mg/dL (ref 8.9–10.3)
Chloride: 107 mmol/L (ref 98–111)
Creatinine, Ser: 0.99 mg/dL (ref 0.61–1.24)
GFR, Estimated: >60 mL/min (ref >60)
Glucose, Bld: 153 mg/dL — ABNORMAL HIGH (ref 70–99)
Potassium: 4.3 mmol/L (ref 3.5–5.1)
Sodium: 138 mmol/L (ref 135–145)

## 2021-11-22 LAB — CBC
HCT: 38.3 % — ABNORMAL LOW (ref 39.0–52.0)
Hemoglobin: 12.7 g/dL — ABNORMAL LOW (ref 13.0–17.0)
MCH: 28.5 pg (ref 26.0–34.0)
MCHC: 33.2 g/dL (ref 30.0–36.0)
MCV: 86.1 fL (ref 80.0–100.0)
Platelets: 251 10*3/uL (ref 150–400)
RBC: 4.45 MIL/uL (ref 4.22–5.81)
RDW: 14.1 % (ref 11.5–15.5)
WBC: 9.3 10*3/uL (ref 4.0–10.5)
nRBC: 0 % (ref 0.0–0.2)

## 2021-11-22 LAB — HEPARIN LEVEL (UNFRACTIONATED)
Heparin Unfractionated: 0.1 IU/mL — ABNORMAL LOW (ref 0.30–0.70)
Heparin Unfractionated: 0.25 IU/mL — ABNORMAL LOW (ref 0.30–0.70)
Heparin Unfractionated: 0.34 IU/mL (ref 0.30–0.70)

## 2021-11-22 LAB — GLUCOSE, CAPILLARY
Glucose-Capillary: 161 mg/dL — ABNORMAL HIGH (ref 70–99)
Glucose-Capillary: 161 mg/dL — ABNORMAL HIGH (ref 70–99)
Glucose-Capillary: 180 mg/dL — ABNORMAL HIGH (ref 70–99)
Glucose-Capillary: 210 mg/dL — ABNORMAL HIGH (ref 70–99)

## 2021-11-22 LAB — LIPOPROTEIN A (LPA): Lipoprotein (a): 20.7 nmol/L (ref <75.0)

## 2021-11-22 LAB — ECHOCARDIOGRAM COMPLETE
Area-P 1/2: 3.6 cm2
Height: 67 in
S' Lateral: 2.5 cm
Weight: 3408 oz

## 2021-11-22 MED ORDER — ONDANSETRON HCL 4 MG/2ML IJ SOLN: 4.0000 mg | Freq: Four times a day (QID) | INTRAMUSCULAR | Status: DC | PRN

## 2021-11-22 MED ORDER — OXYBUTYNIN CHLORIDE ER 5 MG PO TB24
5.0000 mg | ORAL_TABLET | Freq: Every day | ORAL | Status: DC
  Administered 2021-11-22 – 2021-11-28 (×6): 5 mg via ORAL
  Filled 2021-11-22 (×8): qty 1

## 2021-11-22 MED ORDER — HEPARIN BOLUS VIA INFUSION
1300.0000 [IU] | Freq: Once | INTRAVENOUS | Status: AC
  Administered 2021-11-22: 1300 [IU] via INTRAVENOUS
  Filled 2021-11-22: qty 1300

## 2021-11-22 MED ORDER — INSULIN ASPART 100 UNIT/ML IJ SOLN
0.0000 [IU] | Freq: Three times a day (TID) | INTRAMUSCULAR | Status: DC
  Administered 2021-11-23: 2 [IU] via SUBCUTANEOUS
  Administered 2021-11-23: 3 [IU] via SUBCUTANEOUS
  Administered 2021-11-23: 2 [IU] via SUBCUTANEOUS

## 2021-11-22 MED ORDER — ORAL CARE MOUTH RINSE: 15.0000 mL | OROMUCOSAL | Status: DC | PRN

## 2021-11-22 MED ORDER — PANTOPRAZOLE SODIUM 40 MG PO TBEC
40.0000 mg | DELAYED_RELEASE_TABLET | Freq: Every day | ORAL | Status: DC
  Administered 2021-11-23: 40 mg via ORAL
  Filled 2021-11-22: qty 1

## 2021-11-22 MED ORDER — METOPROLOL TARTRATE 12.5 MG HALF TABLET
12.5000 mg | ORAL_TABLET | Freq: Two times a day (BID) | ORAL | Status: DC
  Administered 2021-11-22 – 2021-11-24 (×4): 12.5 mg via ORAL
  Filled 2021-11-22 (×4): qty 1

## 2021-11-22 MED ORDER — LORATADINE 10 MG PO TABS
10.0000 mg | ORAL_TABLET | Freq: Every day | ORAL | Status: DC
  Administered 2021-11-23 – 2021-11-29 (×6): 10 mg via ORAL
  Filled 2021-11-22 (×6): qty 1

## 2021-11-22 MED ORDER — NITROGLYCERIN 0.4 MG SL SUBL: 0.4000 mg | SUBLINGUAL_TABLET | SUBLINGUAL | Status: DC | PRN

## 2021-11-22 MED ORDER — ATORVASTATIN CALCIUM 80 MG PO TABS
80.0000 mg | ORAL_TABLET | Freq: Every day | ORAL | Status: DC
  Administered 2021-11-23 – 2021-11-29 (×6): 80 mg via ORAL
  Filled 2021-11-22 (×6): qty 1

## 2021-11-22 MED ORDER — ACETAMINOPHEN 325 MG PO TABS: 650.0000 mg | ORAL_TABLET | ORAL | Status: DC | PRN

## 2021-11-22 MED ORDER — HEPARIN (PORCINE) 25000 UT/250ML-% IV SOLN
1950.0000 [IU]/h | INTRAVENOUS | Status: DC
  Administered 2021-11-22: 1700 [IU]/h via INTRAVENOUS
  Administered 2021-11-23: 1800 [IU]/h via INTRAVENOUS
  Administered 2021-11-23: 1950 [IU]/h via INTRAVENOUS
  Filled 2021-11-22 (×2): qty 250

## 2021-11-22 MED ORDER — AMLODIPINE BESYLATE 5 MG PO TABS
5.0000 mg | ORAL_TABLET | Freq: Every day | ORAL | Status: DC
  Administered 2021-11-23: 5 mg via ORAL
  Filled 2021-11-22: qty 1

## 2021-11-22 MED ORDER — ASPIRIN 81 MG PO TBEC
81.0000 mg | DELAYED_RELEASE_TABLET | Freq: Every day | ORAL | Status: DC
  Administered 2021-11-23: 81 mg via ORAL
  Filled 2021-11-22: qty 1

## 2021-11-22 NOTE — Progress Notes (Signed)
[facility] Cardiology    SUBJECTIVE: Patient states he feels reasonably well denies any chest pain or anginal no shortness of breath still receiving IV heparin ready for transfer to [facility] for potential coronary bypass surgery   Vitals:   11/21/21 2357 11/22/21 0354 11/22/21 0735 11/22/21 1130  BP: (!) 144/80 139/78 (!) 140/78 113/71  Pulse: 69 70 64 64  Resp: '20 20 18 19  '$ Temp: (!) 97.5 F (36.4 C) 98.1 F (36.7 C) 98 F (36.7 C) 98.1 F (36.7 C)  TempSrc: Oral  Oral   SpO2: 97% 97% 95% 97%  Weight:      Height:         Intake/Output Summary (Last 24 hours) at 11/22/2021 1307 Last data filed at 11/22/2021 1004 Gross per 24 hour  Intake 2339.67 ml  Output 280 ml  Net 2059.67 ml      PHYSICAL EXAM  General: Well developed, well nourished, in no acute distress HEENT:  Normocephalic and atramatic Neck:  No JVD.  Lungs: Clear bilaterally to auscultation and percussion. Heart: HRRR . Normal S1 and S2 without gallops or murmurs.  Abdomen: Bowel sounds are positive, abdomen soft and non-tender  Msk:  Back normal, normal gait. Normal strength and tone for age. Extremities: No clubbing, cyanosis or edema.   Neuro: Alert and oriented X 3. Psych:  Good affect, responds appropriately   LABS: Basic Metabolic Panel: Recent Labs    11/21/21 0548 11/22/21 0425  NA 135 138  K 3.9 4.3  CL 107 107  CO2 22 22  GLUCOSE 162* 153*  BUN 23 16  CREATININE 0.96 0.99  CALCIUM 8.9 8.9   Liver Function Tests: Recent Labs    11/20/21 1956  AST 27  ALT 17  ALKPHOS 65  BILITOT 0.6  PROT 7.9  ALBUMIN 3.9   No results for input(s): "LIPASE", "AMYLASE" in the last 72 hours. CBC: Recent Labs    11/20/21 1956 11/21/21 0548 11/22/21 0425  WBC 10.1 9.1 9.3  NEUTROABS 6.3  --   --   HGB 14.3 13.1 12.7*  HCT 42.2 39.9 38.3*  MCV 86.7 86.7 86.1  PLT 296 231 251   Cardiac Enzymes: No results for input(s): "CKTOTAL", "CKMB", "CKMBINDEX", "TROPONINI" in the last 72  hours. BNP: Invalid input(s): "POCBNP" D-Dimer: Recent Labs    11/20/21 1744  DDIMER 1.15*   Hemoglobin A1C: Recent Labs    11/21/21 0548  HGBA1C 7.3*   Fasting Lipid Panel: Recent Labs    11/21/21 0548  CHOL 174  HDL 27*  LDLCALC 108*  TRIG 197*  CHOLHDL 6.4   Thyroid Function Tests: No results for input(s): "TSH", "T4TOTAL", "T3FREE", "THYROIDAB" in the last 72 hours.  Invalid input(s): "FREET3" Anemia Panel: No results for input(s): "VITAMINB12", "FOLATE", "FERRITIN", "TIBC", "IRON", "RETICCTPCT" in the last 72 hours.  ECHOCARDIOGRAM COMPLETE  Result Date: 11/22/2021    ECHOCARDIOGRAM REPORT   Patient Name:   Samuel [name] Date of Exam: 11/21/2021 Medical Rec #:  [id number]    Height:       67.0 in Accession #:    [id number]   Weight:       213.0 lb Date of Birth:  1951/02/10    BSA:          2.077 m Patient Age:    26 years     BP:           128/78 mmHg Patient Gender: M  HR:           70 bpm. Exam Location:  [facility] Procedure: 2D Echo, Cardiac Doppler and Color Doppler Indications:     I21.4 NSTEMI  History:         Patient has no prior history of Echocardiogram examinations.                  Stroke; Risk Factors:Diabetes, Dyslipidemia, Sleep Apnea and                  Hypertension.  Sonographer:     Cresenciano Lick RDCS Referring Phys:  [id number] [place] TANG Diagnosing Phys: Yolonda Kida MD IMPRESSIONS  1. Normal LVF.  2. Left ventricular ejection fraction, by estimation, is 70 to 75%. Left ventricular ejection fraction by PLAX is 75 %. The left ventricle has hyperdynamic function. The left ventricle has no regional wall motion abnormalities. There is mild concentric left ventricular hypertrophy. Left ventricular diastolic parameters were normal.  3. Right ventricular systolic function is normal. The right ventricular size is normal. There is normal pulmonary artery systolic pressure.  4. The mitral valve is normal in structure. No evidence of mitral valve  regurgitation.  5. The aortic valve was not assessed. Aortic valve regurgitation is not visualized. FINDINGS  Left Ventricle: Left ventricular ejection fraction, by estimation, is 70 to 75%. Left ventricular ejection fraction by PLAX is 75 %. The left ventricle has hyperdynamic function. The left ventricle has no regional wall motion abnormalities. The left ventricular internal cavity size was small. There is mild concentric left ventricular hypertrophy. Left ventricular diastolic parameters were normal. Right Ventricle: The right ventricular size is normal. No increase in right ventricular wall thickness. Right ventricular systolic function is normal. There is normal pulmonary artery systolic pressure. Left Atrium: Left atrial size was normal in size. Right Atrium: Right atrial size was normal in size. Pericardium: There is no evidence of pericardial effusion. Mitral Valve: The mitral valve is normal in structure. No evidence of mitral valve regurgitation. Tricuspid Valve: The tricuspid valve is normal in structure. Tricuspid valve regurgitation is trivial. Aortic Valve: The aortic valve was not assessed. Aortic valve regurgitation is not visualized. Pulmonic Valve: The pulmonic valve was normal in structure. Pulmonic valve regurgitation is not visualized. Aorta: The ascending aorta was not well visualized. IAS/Shunts: No atrial level shunt detected by color flow Doppler. Additional Comments: Normal LVF.  LEFT VENTRICLE PLAX 2D LV EF:         Left            Diastology                ventricular     LV e' medial:    7.50 cm/s                ejection        LV E/e' medial:  13.6                fraction by     LV e' lateral:   9.98 cm/s                PLAX is 75      LV E/e' lateral: 10.2                %. LVIDd:         4.40 cm LVIDs:         2.50 cm LV PW:         1.20  cm LV IVS:        1.20 cm LVOT diam:     1.90 cm LV SV:         75 LV SV Index:   36 LVOT Area:     2.84 cm  RIGHT VENTRICLE             IVC RV  Basal diam:  3.40 cm     IVC diam: 1.70 cm RV S prime:     16.33 cm/s TAPSE (M-mode): 2.2 cm LEFT ATRIUM             Index        RIGHT ATRIUM           Index LA diam:        4.40 cm 2.12 cm/m   RA Area:     12.80 cm LA Vol (A2C):   42.2 ml 20.32 ml/m  RA Volume:   29.50 ml  14.20 ml/m LA Vol (A4C):   45.2 ml 21.76 ml/m LA Biplane Vol: 43.9 ml 21.14 ml/m  AORTIC VALVE LVOT Vmax:   145.50 cm/s LVOT Vmean:  91.200 cm/s LVOT VTI:    0.264 m  AORTA Ao Root diam: 3.60 cm Ao Asc diam:  3.60 cm MITRAL VALVE MV Area (PHT): 3.60 cm     SHUNTS MV Decel Time: 211 msec     Systemic VTI:  0.26 m MV E velocity: 102.00 cm/s  Systemic Diam: 1.90 cm MV A velocity: 96.80 cm/s MV E/A ratio:  1.05 Yolonda Kida MD Electronically signed by Yolonda Kida MD Signature Date/Time: 11/22/2021/12:57:19 PM    Final    CARDIAC CATHETERIZATION  Result Date: 11/21/2021   Ost LM to Mid LM lesion is 80% stenosed.   Ost Cx to Prox Cx lesion is 90% stenosed.   Prox Cx to Mid Cx lesion is 80% stenosed.   1st Diag lesion is 60% stenosed.   Ost RCA to Prox RCA lesion is 90% stenosed.   Prox RCA lesion is 40% stenosed.   Mid RCA to Dist RCA lesion is 80% stenosed.   Dist RCA lesion is 60% stenosed.   The left ventricular systolic function is normal.   LV end diastolic pressure is moderately elevated.   The left ventricular ejection fraction is 55-65% by visual estimate. 1.  NSTEMI 2.  Left main and three-vessel coronary artery disease 80% stenosis left main, 60% stenosis D1, 90% stenosis ostial left circumflex, 90% stenosis ostial RCA, 80% stenosis mid to distal RCA 3.  Preserved left ventricular function Recommendations CABG   CT Angio Chest PE W/Cm &/Or Wo Cm  Result Date: 11/20/2021 CLINICAL DATA:  Pulmonary embolism (PE) suspected, positive D-dimer EXAM: CT ANGIOGRAPHY CHEST WITH CONTRAST TECHNIQUE: Multidetector CT imaging of the chest was performed using the standard protocol during bolus administration of intravenous  contrast. Multiplanar CT image reconstructions and MIPs were obtained to evaluate the vascular anatomy. RADIATION DOSE REDUCTION: This exam was performed according to the departmental dose-optimization program which includes automated exposure control, adjustment of the mA and/or kV according to patient size and/or use of iterative reconstruction technique. CONTRAST:  4m OMNIPAQUE IOHEXOL 350 MG/ML SOLN COMPARISON:  None Available. FINDINGS: Cardiovascular: No filling defects in the pulmonary arteries to suggest pulmonary emboli. Heart is normal size. Aorta is normal caliber. Scattered coronary artery and aortic calcifications. Mediastinum/Nodes: No mediastinal, hilar, or axillary adenopathy. Trachea and esophagus are unremarkable. Thyroid unremarkable. Lungs/Pleura: No confluent opacities or effusions. Upper Abdomen: No acute  findings Musculoskeletal: Chest wall soft tissues are unremarkable. No acute bony abnormality. Review of the MIP images confirms the above findings. IMPRESSION: No evidence of pulmonary embolus. No acute cardiopulmonary disease. Coronary artery disease. Aortic Atherosclerosis (ICD10-I70.0). Electronically Signed   By: Rolm Baptise M.D.   On: 11/20/2021 21:21   DG Chest 2 View  Result Date: 11/20/2021 CLINICAL DATA:  Chest pain EXAM: CHEST - 2 VIEW COMPARISON:  None available FINDINGS: No focal consolidation, pleural effusion, or pneumothorax. Bibasilar atelectasis/scarring. Normal cardiomediastinal silhouette. No acute osseous abnormality. Aortic atherosclerotic calcification. Postoperative changes right humeral head IMPRESSION: No active cardiopulmonary disease. Electronically Signed   By: Placido Sou M.D.   On: 11/20/2021 20:20     Echo echocardiogram reportedly shows preserved left ventricular function of at least 75% with no significant wall motion abnormalities  TELEMETRY: Telemetry reviewed by me showed normal sinus rhythm rate of 75 nonspecific changes:  ASSESSMENT AND  PLAN:  Principal Problem:   NSTEMI (non-ST elevated myocardial infarction) ([place]) Active Problems:   Obstructive sleep apnea   AKI (acute kidney injury) ([place])   Uncontrolled type 2 diabetes mellitus with hyperglycemia, without long-term current use of insulin (HCC)   Dyslipidemia   Essential hypertension   GERD without esophagitis   Plan Continue anticoagulation with heparin for non-STEMI Recommend transfer to tertiary care facility for coronary bypass surgery for multivessel coronary artery disease Sleep study CPAP for obstructive sleep apnea Adequate hydration for renal insufficiency Continue diabetes management and control Statin therapy for hyperlipidemia Commend hypertension management preferably with a beta-blocker ACE or ARB Maintaining aspirin therapy indefinitely Consider omeprazole or Pepcid for esophagitis as necessary Once patient receives bypass surgery: Outpatient follow back up with Dr. San Morelle, MD 11/22/2021 1:07 PM

## 2021-11-22 NOTE — Progress Notes (Signed)
Called report to RN on [facility].  Report given to [facility].  Pt to be transported to [facility] for further treatment.

## 2021-11-22 NOTE — Consult Note (Signed)
ANTICOAGULATION CONSULT NOTE - Initial Consult  Pharmacy Consult for heparin infusion Indication: chest pain/ACS  No Known Allergies  Patient Measurements: Height: '5\' 7"'$  (170.2 cm) Weight: 96.6 kg (213 lb) IBW/kg (Calculated) : 66.1 Heparin Dosing Weight: 86.8 kg   Vital Signs: Temp: 97.5 F (36.4 C) (09/26 2357) Temp Source: Oral (09/26 2357) BP: 144/80 (09/26 2357) Pulse Rate: 69 (09/26 2357)  Labs: Recent Labs    11/20/21 1744 11/20/21 1956 11/20/21 2156 11/21/21 0548 11/22/21 0151  HGB  --  14.3  --  13.1  --   HCT  --  42.2  --  39.9  --   PLT  --  296  --  231  --   APTT  --   --  32  --   --   LABPROT  --   --  13.7 14.1  --   INR  --   --  1.1 1.1  --   HEPARINUNFRC  --   --   --  <0.10* 0.10*  CREATININE  --  1.30*  --  0.96  --   TROPONINIHS 183* 198* 178*  --   --      Estimated Creatinine Clearance: 77 mL/min (by C-G formula based on SCr of 0.96 mg/dL).   Medical History: Past Medical History:  Diagnosis Date   Anxiety    situational   Arthritis    Cancer ([place])    squamous cell carcinoma   Diabetes mellitus without complication ([place])    ED (erectile dysfunction)    GERD (gastroesophageal reflux disease)    Hyperlipidemia    Hypertension    OSA (obstructive sleep apnea)    CPAP   Stroke ([place])    no residual     Medications:  No prior AC noted   Assessment:  71 y.o. male with no prior cardiac history who presents on ED 11/20/21 after he was told he had an abnormal heart blood test earlier today. Reports over the last 2 weeks has been experiencing fatigue and chest pain intermittently.  Troponin I 183>198 Goal of Therapy:  Heparin level 0.3-0.7 units/ml Monitor platelets by anticoagulation protocol: Yes   Plan:  9/27:  HL @ 0151 = 0.10, SUBtherapeutic  Will increase drip rate to 1500 units/hr and recheck HL 8 hrs after rate change.   Orene Desanctis, PharmD Clinical Pharmacist   11/22/2021,2:46 AM

## 2021-11-22 NOTE — H&P (Addendum)
Cardiology Admission History and Physical   Patient ID: BASHAR MILAM MRN: [id number]; DOB: 31-Aug-1950   Admission date: 11/22/2021  PCP:  Kirk Ruths, MD   [place] Providers Cardiologist:  Dr. Saralyn Pilar   Chief Complaint:  Chest pain - ACS / NSTEMI  Patient Profile:   LEVESTER WALDRIDGE is a 71 y.o. male with hypertension, hyperlipidemia, diabetes mellitus -2 , morbid obesity and sleep apnea (on CPAP) who is being seen 11/22/2021 for the evaluation of severe multivessel CAD including ostial LM and ostial RCA, ostial LCx disease found as part of evaluation for progressive angina. He is transferred by Dr. Saralyn Pilar for CABG evaluation after identifying the above CAD on cardiac catheterization.  History of Present Illness:   Mr. Kerce reported 2 weeks history of exertional shortness of breath along with substernal chest tightness radiating to his jaw.  Symptoms progressively worsened.  Seen by his PCP who was concerned about EKG and his symptoms.  They check troponin levels that were reported as elevated.  He was therefore contacted by his PCP to go to the emergency room for evaluation where he was seen in consultation by Dr. Saralyn Pilar.   (High-sensitivity troponin 183>>198>>178).  With progressive anginal symptoms leading ACS presentation, he was referred for cardiac catheterization revealing Left Main/Three-Vessel Obstructive CAD.  This was felt to be surgical anatomy, after discussion with the patient and his wife as to location of transfer, he was transferred to [facility] for CABG evaluation.  He remains on heparin, and has been chest pain-free as long as he is at rest.  His wife is quick to point out that with any activity, he would have dyspnea and chest discomfort..   Echocardiogram showed preserved LV function without regional wall motion abnormality.  Creatinine 0.99.  CT angio without pulmonary embolism.  At baseline patient is very active.  Multiple family  members with history of CAD and MI.  Denies alcohol use or tobacco smoking.  No palpitation, dizziness, orthopnea, PND, syncope, lower extremity edema or melena.  Hemoglobin A1c 7.3.  Past Medical History:  Diagnosis Date   Anxiety    situational   Arthritis    Cancer ([place])    squamous cell carcinoma   ED (erectile dysfunction)    GERD (gastroesophageal reflux disease)    Hyperlipidemia    Hypertension    OSA (obstructive sleep apnea)    CPAP   Stroke ([place])    no residual    Uncontrolled type 2 diabetes mellitus with hyperglycemia, with long-term current use of insulin ([place])     Past Surgical History:  Procedure Laterality Date   CARDIAC CATHETERIZATION     COLONOSCOPY     KNEE ARTHROPLASTY Left 03/20/2019   Procedure: LEFT COMPUTER ASSISTED TOTAL KNEE ARTHROPLASTY;  Surgeon: Dereck Leep, MD;  Location: [facility] ORS;  Service: Orthopedics;  Laterality: Left;   KNEE ARTHROPLASTY Right 11/09/2019   Procedure: COMPUTER ASSISTED TOTAL KNEE ARTHROPLASTY;  Surgeon: Dereck Leep, MD;  Location: [facility] ORS;  Service: Orthopedics;  Laterality: Right;   KNEE ARTHROSCOPY Left    LEFT HEART CATH N/A 11/21/2021   Procedure: Left Heart Cath;  Surgeon: Isaias Cowman, MD;  Location: [place] [facility] LAB;  Service: Cardiovascular;  Laterality: N/A;   SHOULDER ARTHROSCOPY Right 2009   rotator cuff   TONSILLECTOMY       Medications Prior to Admission: Prior to Admission medications   Medication Sig Start Date End Date Taking? Authorizing Provider  amLODipine (NORVASC)  5 MG tablet Take 5 mg by mouth daily.    [provider]  aspirin 81 MG EC tablet Take 81 mg by mouth daily.     [provider]  atorvastatin (LIPITOR) 80 MG tablet Take 1 tablet (80 mg total) by mouth daily. 11/21/21 11/21/22  Lavina Hamman, MD  heparin 25000 UT/250ML infusion Inject 1,500 Units/hr into the vein continuous. 11/21/21   Lavina Hamman, MD  loratadine (CLARITIN) 10 MG tablet Take 10 mg by  mouth daily.    [provider]  nitroGLYCERIN (NITROSTAT) 0.4 MG SL tablet Place under the tongue. 11/20/21   [provider]  omeprazole (PRILOSEC) 20 MG capsule Take 20 mg by mouth daily before breakfast.    [provider]  oxybutynin (DITROPAN-XL) 5 MG 24 hr tablet Take 5 mg by mouth daily. 09/12/21   [provider]     Allergies:   No Known Allergies  Social History:   Social History   Socioeconomic History   Marital status: Married    Spouse name: Not on file   Number of children: 3   Years of education: 12   Highest education level: Some college, no degree  Occupational History   Not on file  Tobacco Use   Smoking status: Never   Smokeless tobacco: Never  Vaping Use   Vaping Use: Never used  Substance and Sexual Activity   Alcohol use: Yes    Alcohol/week: 2.0 standard drinks of alcohol    Types: 2 Standard drinks or equivalent per week   Drug use: Never   Sexual activity: Not Currently    Birth control/protection: Post-menopausal  Other Topics Concern   Not on file  Social History Narrative   Not on file   Social Determinants of Health   Financial Resource Strain: Not on file  Food Insecurity: No Food Insecurity (11/22/2021)   Hunger Vital Sign    Worried About Running Out of Food in the Last Year: Never true    Ran Out of Food in the Last Year: Never true  Transportation Needs: No Transportation Needs (11/22/2021)   PRAPARE - Hydrologist (Medical): No    Lack of Transportation (Non-Medical): No  Physical Activity: Not on file  Stress: Not on file  Social Connections: Not on file  Intimate Partner Violence: Not At Risk (11/22/2021)   Humiliation, Afraid, Rape, and Kick questionnaire    Fear of Current or Ex-Partner: No    Emotionally Abused: No    Physically Abused: No    Sexually Abused: No    Family History:   The patient's family history includes CAD in his brother and sister.    ROS:   Please see the history of present illness.  All other ROS reviewed and negative.     Physical Exam/Data:   Vitals:   11/22/21 1634 11/22/21 1948 11/22/21 2300  BP: 123/77 133/81   Pulse: 68 75   Resp: 18    Temp: 98.5 F (36.9 C) 97.8 F (36.6 C)   TempSrc: Oral Oral   SpO2: 95%    Weight: 95.8 kg    Height:   '5\' 7"'$  (1.702 m)    Intake/Output Summary (Last 24 hours) at 11/22/2021 2333 Last data filed at 11/22/2021 2130 Gross per 24 hour  Intake 663.33 ml  Output --  Net 663.33 ml      11/22/2021    4:34 PM 11/21/2021   10:56 AM 11/20/2021  8:13 PM  Last 3 Weights  Weight (lbs) 211 lb 4.8 oz 213 lb 215 lb 9.8 oz  Weight (kg) 95.845 kg 96.616 kg 97.8 kg     Body mass index is 33.09 kg/m.  General:  Well nourished, well developed, in no acute distress HEENT: normal Neck: no JVD Vascular: No carotid bruits; Distal pulses 2+ bilaterally   Cardiac:  normal S1, S2; RRR; no murmur  Lungs:  clear to auscultation bilaterally, no wheezing, rhonchi or rales  Abd: soft, nontender, no hepatomegaly  Ext: no edema Musculoskeletal:  No deformities, BUE and BLE strength normal and equal Skin: warm and dry  Neuro:  CNs 2-12 intact, no focal abnormalities noted Psych:  Normal affect    EKG:  The ECG that was done today was personally reviewed and demonstrates NSR, RBBB  Relevant CV Studies:  Echo 11/21/21: 1. Normal LVF. EF 70-75%.  No RWMA.  Mild concentric LVH. "  Normal diastolic parameters for age "".  Normal RV with RVSP.  Essentially normal valves.  Left Heart Cath 11/21/21: 1.  NSTEMI.  2.  Left main and 3V CAD:  80% stenosis left main, 60% stenosis D1, 90% stenosis ostial left circumflex, 90% stenosis ostial RCA, 80% stenosis mid to distal RCA.  3.  Preserved left ventricular function    Recommendations: CABG   Diagnostic  Dominance: Right:   Ost LM to Mid LM lesion is 80% stenosed.   1st Diag lesion is 60% stenosed:    Ost Cx to Prox Cx lesion is 90% stenosed.   Prox Cx  to Mid Cx lesion is 80% stenosed:    Ost RCA to Prox  RCA lesion is 90% stenosed.   Prox RCA lesion is 40% stenosed.   Mid RCA to Dist RCA lesion is 80% stenosed.   Dist RCA lesion is 60% stenosed:    Normal LV systolic function EF 83-41%.  Moderate elevated LVEDP.    Laboratory Data:  High Sensitivity Troponin:   Recent Labs  Lab 11/20/21 1744 11/20/21 1956 11/20/21 2156  TROPONINIHS 183* 198* 178*      Chemistry Recent Labs  Lab 11/21/21 0548 11/22/21 0425  NA 135 138  K 3.9 4.3  CL 107 107  CO2 22 22  GLUCOSE 162* 153*  BUN 23 16  CREATININE 0.96 0.99  CALCIUM 8.9 8.9  GFRNONAA >60 >60  ANIONGAP 6 9    Recent Labs  Lab 11/20/21 1956  PROT 7.9  ALBUMIN 3.9  AST 27  ALT 17  ALKPHOS 65  BILITOT 0.6   Lipids  Recent Labs  Lab 11/21/21 0548  CHOL 174  TRIG 197*  HDL 27*  LDLCALC 108*  CHOLHDL 6.4   Hematology Recent Labs  Lab 11/21/21 0548 11/22/21 0425  WBC 9.1 9.3  RBC 4.60 4.45  HGB 13.1 12.7*  HCT 39.9 38.3*  MCV 86.7 86.1  MCH 28.5 28.5  MCHC 32.8 33.2  RDW 14.3 14.1  PLT 231 251   Thyroid No results for input(s): "TSH", "FREET4" in the last 168 hours. BNP Recent Labs  Lab 11/20/21 1958  BNP 35.4    DDimer  Recent Labs  Lab 11/20/21 1744  DDIMER 1.15*     Radiology/Studies:  No Radiology Studies   Assessment and Plan:    Principal Problem:   NSTEMI (non-ST elevated myocardial infarction) [facility]) Active Problems:   Acute myocardial infarction involving left main coronary artery (HCC)   Coronary artery disease involving native coronary artery of native heart with  unstable angina pectoris ([place])   Dyslipidemia associated with type 2 diabetes mellitus ([place])   Uncontrolled type 2 diabetes mellitus with hyperglycemia, without long-term current use of insulin ([place])   Essential hypertension   OSA on CPAP  MV/LM CAD/NSTEMI Patient with 2-weeks history of progressive worsening anginal symptoms.  Cardiac catheterization with left  main and three-vessel severe CAD as summarizing above.  Sent from [facility] to [facility] for CABG evaluation.  Currently chest pain-free.  CVTS has been consulted. Continue IV heparin, aspirin 81 mg, Lipitor 80 mg We will add low-dose beta-blocker in preparation for CABG  2.  Hypertension -Continue amlodipine -Continue to hold lisinopril/HCTZ -Add beta-blocker  3.  Diabetes mellitus -Hemoglobin A1c 7.3 -Sliding scale insulin  4.  OSA on CPAP  5. HLD -11/21/2021: Cholesterol 174; HDL 27; LDL Cholesterol 108; Triglycerides 197; VLDL 39  - Continue Lipitor '80mg'$  qd    Risk Assessment/Risk Scores:    TIMI Risk Score for Unstable Angina or Non-ST Elevation MI:   The patient's TIMI risk score is 6, which indicates a 41% risk of all cause mortality, new or recurrent myocardial infarction or need for urgent revascularization in the next 14 days.{   Severity of Illness: The appropriate patient status for this patient is INPATIENT. Inpatient status is judged to be reasonable and necessary in order to provide the required intensity of service to ensure the patient's safety. The patient's presenting symptoms, physical exam findings, and initial radiographic and laboratory data in the context of their chronic comorbidities is felt to place them at high risk for further clinical deterioration. Furthermore, it is not anticipated that the patient will be medically stable for discharge from the hospital within 2 midnights of admission.   * I certify that at the point of admission it is my clinical judgment that the patient will require inpatient hospital care spanning beyond 2 midnights from the point of admission due to high intensity of service, high risk for further deterioration and high frequency of surveillance required.*   For questions or updates, please contact [place] Please consult [email or web address] for contact info under     Jarrett Soho, [place]  11/22/2021 5:15 PM     ATTENDING ATTESTATION  I have seen, examined and evaluated the patient this PM along with Mr. Curly [place], [place].  After reviewing all the available data and chart, we discussed the patients laboratory, study & physical findings as well as symptoms in detail.  I agree with his findings, examination as well as impression recommendations as per our discussion.    Attending adjustments noted in italics.   We actually performed the history and physical examination together.  Moderately obese gentleman with no prior cardiac history but he does have HTN, dyslipidemia and diabetes mellitus consistent with metabolic syndrome.  He had not had a issues until about 2 weeks ago when he started developing profound exertional dyspnea fatigue and chest discomfort.  Due to his wife's insistence, he did go to the PCPs office this Monday, 11/20/2021.  They are apparently concerned about EKG findings and therefore checked a troponin level.  He actually had gone from the clinic to see his grandson's flag football game.  While he was there, he received a telephone call from his 56 office indicating that his troponin levels were elevated and he should go to the emergency room.  He did have elevated troponin levels in the ER and he was seen by Dr. Saralyn Pilar in consultation and was scheduled for cardiac catheterization  revealed the above findings of severe multivessel disease.  Clearly this is surgical disease with ostial LM and ostial RCA as well as ostial LCx disease not to mention other lesions.  After discussion with the patient and his wife, they decided that they wanted to be transferred to [facility] for CABG.  He was accepted by CVTS to be admitted to our service for CVTS consultation.  Agree with plan to start beta-blocker, continue IV heparin.  As long as he is chest pain-free, we do not need to place on nitrates. I suspect that he will have his prestudies performed per CVTS.      Leonie Man, MD,  MS Glenetta Hew, M.D., M.S. Interventional Cardiologist  [place]  Pager # [phone] Phone # [phone] [address] [place], [place] [address]

## 2021-11-22 NOTE — Consult Note (Signed)
ANTICOAGULATION CONSULT NOTE   Pharmacy Consult for heparin infusion Indication: chest pain/ACS  No Known Allergies  Patient Measurements: Height: '5\' 7"'$  (170.2 cm) Weight: 95.8 kg (211 lb 4.8 oz) IBW/kg (Calculated) : 66.1 Heparin Dosing Weight: 86.8 kg   Vital Signs: Temp: 98 F (36.7 C) (09/27 2337) Temp Source: Oral (09/27 2337) BP: 143/84 (09/27 2337) Pulse Rate: 78 (09/27 2337)  Labs: Recent Labs    11/20/21 1744 11/20/21 1956 11/20/21 1956 11/20/21 2156 11/21/21 0548 11/22/21 0151 11/22/21 0425 11/22/21 1147 11/22/21 2252  HGB  --  14.3   < >  --  13.1  --  12.7*  --   --   HCT  --  42.2  --   --  39.9  --  38.3*  --   --   PLT  --  296  --   --  231  --  251  --   --   APTT  --   --   --  32  --   --   --   --   --   LABPROT  --   --   --  13.7 14.1  --   --   --   --   INR  --   --   --  1.1 1.1  --   --   --   --   HEPARINUNFRC  --   --    < >  --  <0.10* 0.10*  --  0.25* 0.34  CREATININE  --  1.30*  --   --  0.96  --  0.99  --   --   TROPONINIHS 183* 198*  --  178*  --   --   --   --   --    < > = values in this interval not displayed.     Estimated Creatinine Clearance: 74.4 mL/min (by C-G formula based on SCr of 0.99 mg/dL).   Medical History: Past Medical History:  Diagnosis Date   Anxiety    situational   Arthritis    Cancer ([place])    squamous cell carcinoma   ED (erectile dysfunction)    GERD (gastroesophageal reflux disease)    Hyperlipidemia    Hypertension    OSA (obstructive sleep apnea)    CPAP   Stroke ([place])    no residual    Uncontrolled type 2 diabetes mellitus with hyperglycemia, with long-term current use of insulin (HCC)     Medications:  No prior AC noted   Assessment:  71 y.o. male with no prior cardiac history who presents on ED 11/20/21 after he was told he had an abnormal heart blood test earlier today. Reports over the last 2 weeks has been experiencing fatigue and chest pain intermittently.  Troponin I  183>198  Heparin level therapeutic: 0.34, no issues with infusion or overt s/sx of bleeding. CBC stable. Patient received bolus earlier, will increase slightly to prevent subtherapeutic level.  Goal of Therapy:  Heparin level 0.3-0.7 units/ml Monitor platelets by anticoagulation protocol: Yes  Plan:  Increase heparin level 1800 units/hr Daily heparin level and CBC  Dawayne Cirri, PharmD Clinical Pharmacist   11/22/2021,11:52 PM

## 2021-11-22 NOTE — Consult Note (Signed)
ANTICOAGULATION CONSULT NOTE - Initial Consult  Pharmacy Consult for heparin infusion Indication: chest pain/ACS  No Known Allergies  Patient Measurements: Height: '5\' 7"'$  (170.2 cm) Weight: 96.6 kg (213 lb) IBW/kg (Calculated) : 66.1 Heparin Dosing Weight: 86.8 kg   Vital Signs: Temp: 98.1 F (36.7 C) (09/27 1130) Temp Source: Oral (09/27 0735) BP: 113/71 (09/27 1130) Pulse Rate: 64 (09/27 1130)  Labs: Recent Labs    11/20/21 1744 11/20/21 1956 11/20/21 1956 11/20/21 2156 11/21/21 0548 11/22/21 0151 11/22/21 0425 11/22/21 1147  HGB  --  14.3   < >  --  13.1  --  12.7*  --   HCT  --  42.2  --   --  39.9  --  38.3*  --   PLT  --  296  --   --  231  --  251  --   APTT  --   --   --  32  --   --   --   --   LABPROT  --   --   --  13.7 14.1  --   --   --   INR  --   --   --  1.1 1.1  --   --   --   HEPARINUNFRC  --   --   --   --  <0.10* 0.10*  --  0.25*  CREATININE  --  1.30*  --   --  0.96  --  0.99  --   TROPONINIHS 183* 198*  --  178*  --   --   --   --    < > = values in this interval not displayed.     Estimated Creatinine Clearance: 74.7 mL/min (by C-G formula based on SCr of 0.99 mg/dL).   Medical History: Past Medical History:  Diagnosis Date   Anxiety    situational   Arthritis    Cancer ([place])    squamous cell carcinoma   Diabetes mellitus without complication ([place])    ED (erectile dysfunction)    GERD (gastroesophageal reflux disease)    Hyperlipidemia    Hypertension    OSA (obstructive sleep apnea)    CPAP   Stroke ([place])    no residual     Medications:  No prior AC noted   Assessment:  71 y.o. male with no prior cardiac history who presents on ED 11/20/21 after he was told he had an abnormal heart blood test earlier today. Reports over the last 2 weeks has been experiencing fatigue and chest pain intermittently.  Troponin I 183>198 Goal of Therapy:  Heparin level 0.3-0.7 units/ml Monitor platelets by anticoagulation protocol:  Yes  Date Time HL Rate/Comment 9/27 0151 0.10 Subtherapeutic/  9/27 1147 0.25 Subtherapeutic 1500 >   Plan:  Heparin level subtherapeutic Give heparin 1300 units bolus and increase rate to 1700 units/hr Recheck HL 8 hrs after rate change.   Darrick Penna, PharmD Clinical Pharmacist   11/22/2021,2:12 PM

## 2021-11-22 NOTE — Progress Notes (Signed)
PROGRESS NOTE  Brief Narrative: Samuel [name] is a 71 y.o. male with a history of T2DM, HTN, HLD, OSA who presented to the ED at [facility] on 9/25 with substernal chest pain radiating to the left arm. He had elevated troponin and at [facility] was found to have severe triple-vessel CAD for which cardiology recommended cardiothoracic surgery evaluation for CABG. The patient is pending transfer to [facility] for this evaluation.   Subjective: Remains on heparin gtt. Denies current bleeding. Chest pain has improved. He's feeling better today than previously and is eating. Had some bleeding at right femoral access site which has resolved with pressure.   Objective: BP 113/71 (BP Location: Left Arm)   Pulse 64   Temp 98.1 F (36.7 C)   Resp 19   Ht '5\' 7"'$  (1.702 m)   Wt 96.6 kg   SpO2 97%   BMI 33.36 kg/m   Gen: No distress Pulm: Clear and nonlabored  CV: RRR, no murmur, no JVD, no edema GI: Soft, NT, ND, +BS  Neuro: Alert and oriented. No focal deficits. Skin: Right radial artery site is c/d/I without tenderness or bleeding. Right femoral access site has no palpable bruit. It is tender without surrounding ecchymosis/hematoma.   Assessment & Plan: Mr. Kirchgessner was admitted with NSTEMI, found to have diffuse severe coronary disease and will be transferred to [facility] under the cardiology service with cardiothoracic surgery consultation. Per my partner, Dr. Posey Pronto, Dr. Gardiner Rhyme has accepted the patient as primary at [facility] and Dr. Lavonna Monarch was notified of need for cardiothoracic surgery consultation.   He has multiple risk factors for CAD including HTN, HLD, and T2DM which are currently stable. He will continue his home CPAP for OSA while in the hospital.   Patrecia Pour, MD Pager on amion 11/22/2021, 3:19 PM

## 2021-11-23 ENCOUNTER — Inpatient Hospital Stay (HOSPITAL_BASED_OUTPATIENT_CLINIC_OR_DEPARTMENT_OTHER): Payer: Medicare Other

## 2021-11-23 DIAGNOSIS — I6523 Occlusion and stenosis of bilateral carotid arteries: Secondary | ICD-10-CM | POA: Clinically undetermined

## 2021-11-23 DIAGNOSIS — I2510 Atherosclerotic heart disease of native coronary artery without angina pectoris: Secondary | ICD-10-CM

## 2021-11-23 DIAGNOSIS — Z01810 Encounter for preprocedural cardiovascular examination: Secondary | ICD-10-CM

## 2021-11-23 DIAGNOSIS — I2101 ST elevation (STEMI) myocardial infarction involving left main coronary artery: Secondary | ICD-10-CM

## 2021-11-23 DIAGNOSIS — I214 Non-ST elevation (NSTEMI) myocardial infarction: Secondary | ICD-10-CM

## 2021-11-23 LAB — BASIC METABOLIC PANEL
Anion gap: 10 (ref 5–15)
BUN: 13 mg/dL (ref 8–23)
CO2: 22 mmol/L (ref 22–32)
Calcium: 9.2 mg/dL (ref 8.9–10.3)
Chloride: 104 mmol/L (ref 98–111)
Creatinine, Ser: 1.15 mg/dL (ref 0.61–1.24)
GFR, Estimated: >60 mL/min (ref >60)
Glucose, Bld: 182 mg/dL — ABNORMAL HIGH (ref 70–99)
Potassium: 4 mmol/L (ref 3.5–5.1)
Sodium: 136 mmol/L (ref 135–145)

## 2021-11-23 LAB — BLOOD GAS, ARTERIAL
Acid-base deficit: 0.3 mmol/L (ref 0.0–2.0)
Bicarbonate: 22.8 mmol/L (ref 20.0–28.0)
O2 Saturation: 98.1 %
Patient temperature: 37
pCO2 arterial: 32 mmHg (ref 32–48)
pH, Arterial: 7.46 — ABNORMAL HIGH (ref 7.35–7.45)
pO2, Arterial: 73 mmHg — ABNORMAL LOW (ref 83–108)

## 2021-11-23 LAB — SURGICAL PCR SCREEN
MRSA, PCR: NEGATIVE
Staphylococcus aureus: NEGATIVE

## 2021-11-23 LAB — CBC
HCT: 39 % (ref 39.0–52.0)
Hemoglobin: 13.2 g/dL (ref 13.0–17.0)
MCH: 29.3 pg (ref 26.0–34.0)
MCHC: 33.8 g/dL (ref 30.0–36.0)
MCV: 86.5 fL (ref 80.0–100.0)
Platelets: 250 10*3/uL (ref 150–400)
RBC: 4.51 MIL/uL (ref 4.22–5.81)
RDW: 14.3 % (ref 11.5–15.5)
WBC: 8.6 10*3/uL (ref 4.0–10.5)
nRBC: 0 % (ref 0.0–0.2)

## 2021-11-23 LAB — URINALYSIS, ROUTINE W REFLEX MICROSCOPIC
Bilirubin Urine: NEGATIVE
Glucose, UA: NEGATIVE mg/dL
Hgb urine dipstick: NEGATIVE
Ketones, ur: NEGATIVE mg/dL
Nitrite: NEGATIVE
Protein, ur: NEGATIVE mg/dL
Specific Gravity, Urine: 1.017 (ref 1.005–1.030)
pH: 5 (ref 5.0–8.0)

## 2021-11-23 LAB — TYPE AND SCREEN
ABO/RH(D): O POS
Antibody Screen: NEGATIVE

## 2021-11-23 LAB — GLUCOSE, CAPILLARY
Glucose-Capillary: 188 mg/dL — ABNORMAL HIGH (ref 70–99)
Glucose-Capillary: 127 mg/dL — ABNORMAL HIGH (ref 70–99)
Glucose-Capillary: 133 mg/dL — ABNORMAL HIGH (ref 70–99)
Glucose-Capillary: 143 mg/dL — ABNORMAL HIGH (ref 70–99)

## 2021-11-23 LAB — HEPARIN LEVEL (UNFRACTIONATED)
Heparin Unfractionated: 0.27 IU/mL — ABNORMAL LOW (ref 0.30–0.70)
Heparin Unfractionated: 0.33 IU/mL (ref 0.30–0.70)

## 2021-11-23 MED ORDER — CEFAZOLIN SODIUM-DEXTROSE 2-4 GM/100ML-% IV SOLN
2.0000 g | INTRAVENOUS | Status: DC
  Filled 2021-11-23: qty 100

## 2021-11-23 MED ORDER — BISACODYL 5 MG PO TBEC
5.0000 mg | DELAYED_RELEASE_TABLET | Freq: Once | ORAL | Status: DC
  Filled 2021-11-23: qty 1

## 2021-11-23 MED ORDER — HEPARIN 30,000 UNITS/1000 ML (OHS) CELLSAVER SOLUTION
Status: DC
  Filled 2021-11-23 (×2): qty 1000

## 2021-11-23 MED ORDER — TRANEXAMIC ACID (OHS) PUMP PRIME SOLUTION
2.0000 mg/kg | INTRAVENOUS | Status: DC
  Filled 2021-11-23 (×2): qty 1.92

## 2021-11-23 MED ORDER — TEMAZEPAM 15 MG PO CAPS: 15.0000 mg | ORAL_CAPSULE | Freq: Once | ORAL | Status: DC | PRN

## 2021-11-23 MED ORDER — MAGNESIUM SULFATE 50 % IJ SOLN
40.0000 meq | INTRAMUSCULAR | Status: DC
  Filled 2021-11-23: qty 9.85

## 2021-11-23 MED ORDER — TRANEXAMIC ACID (OHS) BOLUS VIA INFUSION
15.0000 mg/kg | INTRAVENOUS | Status: AC
  Administered 2021-11-24: 1437 mg via INTRAVENOUS
  Filled 2021-11-23: qty 1437

## 2021-11-23 MED ORDER — CHLORHEXIDINE GLUCONATE CLOTH 2 % EX PADS
6.0000 | MEDICATED_PAD | Freq: Once | CUTANEOUS | Status: AC
  Administered 2021-11-24: 6 via TOPICAL

## 2021-11-23 MED ORDER — VANCOMYCIN HCL 1000 MG IV SOLR
INTRAVENOUS | Status: DC
  Filled 2021-11-23 (×2): qty 20

## 2021-11-23 MED ORDER — MILRINONE LACTATE IN DEXTROSE 20-5 MG/100ML-% IV SOLN
0.3000 ug/kg/min | INTRAVENOUS | Status: DC
  Filled 2021-11-23: qty 100

## 2021-11-23 MED ORDER — HYDROCORTISONE 1 % EX CREA
TOPICAL_CREAM | Freq: Three times a day (TID) | CUTANEOUS | Status: DC | PRN
  Filled 2021-11-23: qty 28

## 2021-11-23 MED ORDER — CEFAZOLIN SODIUM-DEXTROSE 2-4 GM/100ML-% IV SOLN
2.0000 g | INTRAVENOUS | Status: AC
  Administered 2021-11-24 (×2): 2 g via INTRAVENOUS
  Filled 2021-11-23: qty 100

## 2021-11-23 MED ORDER — POTASSIUM CHLORIDE 2 MEQ/ML IV SOLN
80.0000 meq | INTRAVENOUS | Status: DC
  Filled 2021-11-23 (×2): qty 40

## 2021-11-23 MED ORDER — TRANEXAMIC ACID 1000 MG/10ML IV SOLN
1.5000 mg/kg/h | INTRAVENOUS | Status: DC
  Filled 2021-11-23 (×2): qty 25

## 2021-11-23 MED ORDER — VANCOMYCIN HCL 1250 MG/250ML IV SOLN
1250.0000 mg | INTRAVENOUS | Status: AC
  Administered 2021-11-24: 1250 mg via INTRAVENOUS
  Filled 2021-11-23: qty 250

## 2021-11-23 MED ORDER — CHLORHEXIDINE GLUCONATE 0.12 % MT SOLN
15.0000 mL | Freq: Once | OROMUCOSAL | Status: AC
  Administered 2021-11-24: 15 mL via OROMUCOSAL
  Filled 2021-11-23: qty 15

## 2021-11-23 MED ORDER — PLASMA-LYTE A IV SOLN
INTRAVENOUS | Status: DC
  Filled 2021-11-23 (×2): qty 2.5

## 2021-11-23 MED ORDER — EPINEPHRINE HCL 5 MG/250ML IV SOLN IN NS
0.0000 ug/min | INTRAVENOUS | Status: DC
  Filled 2021-11-23: qty 250

## 2021-11-23 MED ORDER — PHENYLEPHRINE HCL-NACL 20-0.9 MG/250ML-% IV SOLN
30.0000 ug/min | INTRAVENOUS | Status: DC
  Filled 2021-11-23: qty 250

## 2021-11-23 MED ORDER — NOREPINEPHRINE 4 MG/250ML-% IV SOLN
0.0000 ug/min | INTRAVENOUS | Status: DC
  Filled 2021-11-23: qty 250

## 2021-11-23 MED ORDER — HYDROCORTISONE 1 % EX CREA
1.0000 | TOPICAL_CREAM | Freq: Three times a day (TID) | CUTANEOUS | Status: DC | PRN
  Filled 2021-11-23: qty 28

## 2021-11-23 MED ORDER — CHLORHEXIDINE GLUCONATE CLOTH 2 % EX PADS
6.0000 | MEDICATED_PAD | Freq: Once | CUTANEOUS | Status: AC
  Administered 2021-11-23: 6 via TOPICAL

## 2021-11-23 MED ORDER — INSULIN REGULAR(HUMAN) IN NACL 100-0.9 UT/100ML-% IV SOLN
INTRAVENOUS | Status: DC
  Filled 2021-11-23: qty 100

## 2021-11-23 MED ORDER — DEXMEDETOMIDINE HCL IN NACL 400 MCG/100ML IV SOLN
0.1000 ug/kg/h | INTRAVENOUS | Status: DC
  Filled 2021-11-23: qty 100

## 2021-11-23 NOTE — Progress Notes (Signed)
Pre-CABG Dopplers completed. Refer to "CV Proc" under chart review to view preliminary results.  11/23/2021 11:40 AM Kelby Aline., MHA, RVT, RDCS, RDMS

## 2021-11-23 NOTE — Progress Notes (Signed)
CARDIAC REHAB PHASE I   Pt received in recliner, agreeable to education. Pt educated on OHS booklet, sternal precautions, discharge planning, and IS use. Pt demonstrated 2250 on IS. All questions from pt answered and pt left in recliner with call bell in reach.  [place], MS 11/23/2021 4:10 PM

## 2021-11-23 NOTE — H&P (View-Only) (Signed)
[address]       [place] [address]             [phone]        Yago L [place]au [place] Medical Record #[id number] Date of Birth: 1950/08/07  Referring: Isaias Cowman, MD Primary Care: Kirk Ruths, MD Primary Cardiologist:None  Chief Complaint: Chest pain, shortness of breath, fatigue   History of Present Illness:     This is a 71 year old male with a past medical history of DM (type II), hypertension, hyperlipidemia, OSA (on CPAP), obesity, stroke, GERD who presented to [facility] ED on 11/20/2021 with complaints of chest pain with exertion, shortness of breath, and fatigue. Per patient, he had  short of breath with exertion about 3 weeks while golfing with his son. He was doing yard work about 2 weeks ago and had chest pain and shortness of breath. He also noticed he has some pain in his left shoulder. He denies LE edema, nausea, vomiting, or palpitations. EKG showed sinus rhythm, RBBB, and inferior T wave inversion. Initial Troponin I (high sensitivity) was 183 and max'd at 198. He ruled in for a NSTEMI.  D dimer was elevated at 1.15 so CTA chest was done and there was no PE. There is CAD and aortic atherosclerosis. Cardiac catheterization done 11/21/2021 showed LVEF 55-65%, ostial to mid LM with 80% stenosis, ostial to proximal Circumflex 90% stenosis, and ostial to proximal RCA with a 90% stenosis. Echo done 11/21/2021 showed LVEF 70-75%, LV has no regional wall motion abnormalities, and no significant valvular disease. Dr. Saralyn Pilar asked for transfer to [facility] for further evaluation and treatment. A cardiothoracic consultation was requested for consideration of coronary artery bypass grafting surgery. At the time of my evaluation, he denied chest pain or shortness of breath. Wife at bedside and patient somewhat emotional.   Current Activity/ Functional Status: Patient is independent with mobility/ambulation, transfers, ADL's,  IADL's.   Zubrod Score: At the time of surgery this patient's most appropriate activity status/level should be described as: '[]'$     0    Normal activity, no symptoms '[x]'$     1    Restricted in physical strenuous activity but ambulatory, able to do out light work '[]'$     2    Ambulatory and capable of self care, unable to do work activities, up and about more than 50%  Of the time                            '[]'$     3    Only limited self care, in bed greater than 50% of waking hours '[]'$     4    Completely disabled, no self care, confined to bed or chair '[]'$     5    Moribund  Past Medical History:  Diagnosis Date   Anxiety    situational   Arthritis    Cancer ([place])    squamous cell carcinoma   ED (erectile dysfunction)    GERD (gastroesophageal reflux disease)    Hyperlipidemia    Hypertension    OSA (obstructive sleep apnea)    CPAP   Stroke [facility])    He has no residual effects (had speech difficulties and right sided motor weakness with initial stroke.  Per patient, was from hypertension (stress from work)   Uncontrolled type 2 diabetes mellitus with hyperglycemia, with long-term current use of insulin ([place])  Past Surgical History:  Procedure Laterality Date   CARDIAC CATHETERIZATION     COLONOSCOPY     KNEE ARTHROPLASTY Left 03/20/2019   Procedure: LEFT COMPUTER ASSISTED TOTAL KNEE ARTHROPLASTY;  Surgeon: Dereck Leep, MD;  Location: [facility] ORS;  Service: Orthopedics;  Laterality: Left;   KNEE ARTHROPLASTY Right 11/09/2019   Procedure: COMPUTER ASSISTED TOTAL KNEE ARTHROPLASTY;  Surgeon: Dereck Leep, MD;  Location: [facility] ORS;  Service: Orthopedics;  Laterality: Right;   KNEE ARTHROSCOPY Left    LEFT HEART CATH N/A 11/21/2021   Procedure: Left Heart Cath;  Surgeon: Isaias Cowman, MD;  Location: [place] [facility] LAB;  Service: Cardiovascular;  Laterality: N/A;   SHOULDER ARTHROSCOPY Right 2009   rotator cuff   TONSILLECTOMY      Social History   Tobacco Use   Smoking Status Never  Smokeless Tobacco Never    Social History   Substance and Sexual Activity  Alcohol Use Yes   Alcohol/week: 2.0 standard drinks of alcohol   Types: 2 Standard drinks or equivalent per week    Allergies: No Known drug or food allergies;however, does have seasonal allergies  Current Facility-Administered Medications  Medication Dose Route Frequency Provider Last Rate Last Admin   acetaminophen (TYLENOL) tablet 650 mg  650 mg Oral Q4H PRN Bhagat, Bhavinkumar, PA       amLODipine (NORVASC) tablet 5 mg  5 mg Oral Daily Bhagat, Bhavinkumar, PA       aspirin EC tablet 81 mg  81 mg Oral Daily Bhagat, Bhavinkumar, PA       atorvastatin (LIPITOR) tablet 80 mg  80 mg Oral Daily Bhagat, Bhavinkumar, PA       heparin ADULT infusion 100 units/mL (25000 units/226m)  1,800 Units/hr Intravenous Continuous RDawayne Cirri RPH 18 mL/hr at 11/23/21 0641 1,800 Units/hr at 11/23/21 0641   hydrocortisone cream 1 % 1 Application  1 Application Topical TID PRN HLeonie Man MD       insulin aspart (novoLOG) injection 0-15 Units  0-15 Units Subcutaneous TID WC Bhagat, Bhavinkumar, PA       loratadine (CLARITIN) tablet 10 mg  10 mg Oral Daily Bhagat, Bhavinkumar, PA       metoprolol tartrate (LOPRESSOR) tablet 12.5 mg  12.5 mg Oral BID Bhagat, Bhavinkumar, PA   12.5 mg at 11/22/21 2120   nitroGLYCERIN (NITROSTAT) SL tablet 0.4 mg  0.4 mg Sublingual Q5 Min x 3 PRN Bhagat, Bhavinkumar, PA       ondansetron (ZOFRAN) injection 4 mg  4 mg Intravenous Q6H PRN Bhagat, Bhavinkumar, PA       Oral care mouth rinse  15 mL Mouth Rinse PRN HLeonie Man MD       oxybutynin (DITROPAN-XL) 24 hr tablet 5 mg  5 mg Oral Daily Bhagat, Bhavinkumar, PA   5 mg at 11/22/21 2121   pantoprazole (PROTONIX) EC tablet 40 mg  40 mg Oral Daily Bhagat, Bhavinkumar, PA        Medications Prior to Admission  Medication Sig Dispense Refill Last Dose   amLODipine (NORVASC) 5 MG tablet Take 5 mg by mouth daily.    11/22/2021   aspirin 81 MG EC tablet Take 81 mg by mouth daily.    11/20/2021   atorvastatin (LIPITOR) 80 MG tablet Take 1 tablet (80 mg total) by mouth daily. 30 tablet 0 11/20/2021   fluticasone (FLONASE) 50 MCG/ACT nasal spray Place 2 sprays into both nostrils as needed for allergies or rhinitis.   11/20/2021  heparin 25000 UT/250ML infusion Inject 1,500 Units/hr into the vein continuous.   11/22/2021 at 1930   loratadine (CLARITIN) 10 MG tablet Take 10 mg by mouth daily.   11/22/2021   Naproxen Sodium (ALEVE PO) Take 1 tablet by mouth as needed (pain).   11/20/2021   omeprazole (PRILOSEC) 20 MG capsule Take 20 mg by mouth daily before breakfast.   11/20/2021   oxybutynin (DITROPAN-XL) 5 MG 24 hr tablet Take 5 mg by mouth daily.   11/20/2021   nitroGLYCERIN (NITROSTAT) 0.4 MG SL tablet Place under the tongue.   never used   RYBELSUS 7 MG TABS Take 1 tablet by mouth daily. (Patient not taking: Reported on 11/22/2021)   Not Taking    Family History  Problem Relation Age of Onset   CAD Sister    CAD Brother   Patient's maternal grandfather  and maternal uncle had CAD Family history of AAA   Review of Systems:     Cardiac Review of Systems: Y or  [  N  ]= no  Chest Pain [ Y-prior to admission   ]  Resting SOB [  N ] Exertional SOB  [Y  ]  Pedal Edema [  N ]     Syncope  [place]  ]   Presyncope [ N  ]  General Review of Systems: [Y] = yes [N  ]=no Constitional:  fatigue [  Y]; nausea [ N ]; night sweats [  N]; fever [ N ]; or chills [ N ]                                                               Dental: Last Dentist visit: A few months ago;he goes every 6 months  Eye : Amaurosis fugax[N  ]; Resp: cough [ N ];  wheezing[ N ];  hemoptysis[  N];  GI:  vomiting[N  ];  dmelena[  N];  hematochezia [ N ]; heartburn[  Y];   GU: hematuria[ N ];                Skin: rash, swelling[ N ];,   peripheral edema[ N ];  ]; Musculosketetal: joint swelling[ N ];  joint erythema[ N ];   Heme/Lymph:  bleeding[ N  ];    Neuro: TIA[N  ];    stroke[Y  ];  vertigo[ Y ];  seizures[N  ];     Psych: anxiety[ Y ];  Endocrine: diabetes[Y  ];  thyroid dysfunction[ N ];                Physical Exam: BP (!) 156/90 (BP Location: Right Arm)   Pulse 69   Temp 98.1 F (36.7 C) (Oral)   Resp 18   Ht '5\' 7"'$  (1.702 m)   Wt 95.8 kg   SpO2 97%   BMI 33.09 kg/m    General appearance: alert, cooperative, and no distress Head: Normocephalic, without obvious abnormality, atraumatic Neck: no carotid bruit, no JVD, and supple, symmetrical, trachea midline Resp: clear to auscultation bilaterally Cardio: RRR, no murmur GI: Soft, obese, non tender, bowel sounds present Extremities: No LE edema. Palpable DP and PT bilaterally Neurologic: Grossly normal  Diagnostic Studies & Laboratory data:     Recent Radiology Findings:  EXAM: CT ANGIOGRAPHY CHEST WITH  CONTRAST   TECHNIQUE: Multidetector CT imaging of the chest was performed using the standard protocol during bolus administration of intravenous contrast. Multiplanar CT image reconstructions and MIPs were obtained to evaluate the vascular anatomy.   RADIATION DOSE REDUCTION: This exam was performed according to the departmental dose-optimization program which includes automated exposure control, adjustment of the mA and/or kV according to patient size and/or use of iterative reconstruction technique.   CONTRAST:  35m OMNIPAQUE IOHEXOL 350 MG/ML SOLN   COMPARISON:  None Available.   FINDINGS: Cardiovascular: No filling defects in the pulmonary arteries to suggest pulmonary emboli. Heart is normal size. Aorta is normal caliber. Scattered coronary artery and aortic calcifications.   Mediastinum/Nodes: No mediastinal, hilar, or axillary adenopathy. Trachea and esophagus are unremarkable. Thyroid unremarkable.   Lungs/Pleura: No confluent opacities or effusions.   Upper Abdomen: No acute findings   Musculoskeletal: Chest wall soft tissues are  unremarkable. No acute bony abnormality.   Review of the MIP images confirms the above findings.   IMPRESSION: No evidence of pulmonary embolus.   No acute cardiopulmonary disease.   Coronary artery disease.   Aortic Atherosclerosis (ICD10-I70.0).     Electronically Signed   By: KRolm BaptiseM.D.   On: 11/20/2021 21:21    ECHOCARDIOGRAM COMPLETE  Result Date: 11/22/2021    ECHOCARDIOGRAM REPORT   Patient Name:   HRHONIN TROTTDate of Exam: 11/21/2021 Medical Rec #:  [id number]   Height:       67.0 in Accession #:    [id number]  Weight:       213.0 lb Date of Birth:  125-Nov-1952   BSA:          2.077 m Patient Age:    78years     BP:           128/78 mmHg Patient Gender: M            HR:           70 bpm. Exam Location:  [facility] Procedure: 2D Echo, Cardiac Doppler and Color Doppler Indications:     I21.4 NSTEMI  History:         Patient has no prior history of Echocardiogram examinations.                  Stroke; Risk Factors:Diabetes, Dyslipidemia, Sleep Apnea and                  Hypertension.  Sonographer:     NCresenciano LickRDCS Referring Phys:  [id number] Diagnosing Phys: DYolonda KidaMD IMPRESSIONS  1. Normal LVF.  2. Left ventricular ejection fraction, by estimation, is 70 to 75%. Left ventricular ejection fraction by PLAX is 75 %. The left ventricle has hyperdynamic function. The left ventricle has no regional wall motion abnormalities. There is mild concentric left ventricular hypertrophy. Left ventricular diastolic parameters were normal.  3. Right ventricular systolic function is normal. The right ventricular size is normal. There is normal pulmonary artery systolic pressure.  4. The mitral valve is normal in structure. No evidence of mitral valve regurgitation.  5. The aortic valve was not assessed. Aortic valve regurgitation is not visualized. FINDINGS  Left Ventricle: Left ventricular ejection fraction, by estimation, is 70 to 75%. Left ventricular ejection  fraction by PLAX is 75 %. The left ventricle has hyperdynamic function. The left ventricle has no regional wall motion abnormalities. The left ventricular internal cavity size was small. There is  mild concentric left ventricular hypertrophy. Left ventricular diastolic parameters were normal. Right Ventricle: The right ventricular size is normal. No increase in right ventricular wall thickness. Right ventricular systolic function is normal. There is normal pulmonary artery systolic pressure. Left Atrium: Left atrial size was normal in size. Right Atrium: Right atrial size was normal in size. Pericardium: There is no evidence of pericardial effusion. Mitral Valve: The mitral valve is normal in structure. No evidence of mitral valve regurgitation. Tricuspid Valve: The tricuspid valve is normal in structure. Tricuspid valve regurgitation is trivial. Aortic Valve: The aortic valve was not assessed. Aortic valve regurgitation is not visualized. Pulmonic Valve: The pulmonic valve was normal in structure. Pulmonic valve regurgitation is not visualized. Aorta: The ascending aorta was not well visualized. IAS/Shunts: No atrial level shunt detected by color flow Doppler. Additional Comments: Normal LVF.  LEFT VENTRICLE PLAX 2D LV EF:         Left            Diastology                ventricular     LV e' medial:    7.50 cm/s                ejection        LV E/e' medial:  13.6                fraction by     LV e' lateral:   9.98 cm/s                PLAX is 75      LV E/e' lateral: 10.2                %. LVIDd:         4.40 cm LVIDs:         2.50 cm LV PW:         1.20 cm LV IVS:        1.20 cm LVOT diam:     1.90 cm LV SV:         75 LV SV Index:   36 LVOT Area:     2.84 cm  RIGHT VENTRICLE             IVC RV Basal diam:  3.40 cm     IVC diam: 1.70 cm RV S prime:     16.33 cm/s TAPSE (M-mode): 2.2 cm LEFT ATRIUM             Index        RIGHT ATRIUM           Index LA diam:        4.40 cm 2.12 cm/m   RA Area:     12.80 cm  LA Vol (A2C):   42.2 ml 20.32 ml/m  RA Volume:   29.50 ml  14.20 ml/m LA Vol (A4C):   45.2 ml 21.76 ml/m LA Biplane Vol: 43.9 ml 21.14 ml/m  AORTIC VALVE LVOT Vmax:   145.50 cm/s LVOT Vmean:  91.200 cm/s LVOT VTI:    0.264 m  AORTA Ao Root diam: 3.60 cm Ao Asc diam:  3.60 cm MITRAL VALVE MV Area (PHT): 3.60 cm     SHUNTS MV Decel Time: 211 msec     Systemic VTI:  0.26 m MV E velocity: 102.00 cm/s  Systemic Diam: 1.90 cm MV A velocity: 96.80 cm/s MV E/A ratio:  1.05 Yolonda Kida MD Electronically  signed by Yolonda Kida MD Signature Date/Time: 11/22/2021/12:57:19 PM    Final    CARDIAC CATHETERIZATION  Result Date: 11/21/2021   Ost LM to Mid LM lesion is 80% stenosed.   Ost Cx to Prox Cx lesion is 90% stenosed.   Prox Cx to Mid Cx lesion is 80% stenosed.   1st Diag lesion is 60% stenosed.   Ost RCA to Prox RCA lesion is 90% stenosed.   Prox RCA lesion is 40% stenosed.   Mid RCA to Dist RCA lesion is 80% stenosed.   Dist RCA lesion is 60% stenosed.   The left ventricular systolic function is normal.   LV end diastolic pressure is moderately elevated.   The left ventricular ejection fraction is 55-65% by visual estimate. 1.  NSTEMI 2.  Left main and three-vessel coronary artery disease 80% stenosis left main, 60% stenosis D1, 90% stenosis ostial left circumflex, 90% stenosis ostial RCA, 80% stenosis mid to distal RCA 3.  Preserved left ventricular function Recommendations CABG    Coronary Diagrams  Diagnostic Dominance: Right  Intervention  I have independently reviewed the above radiologic studies and discussed with the patient   Recent Lab Findings: Lab Results  Component Value Date   WBC 9.3 11/22/2021   HGB 12.7 (L) 11/22/2021   HCT 38.3 (L) 11/22/2021   PLT 251 11/22/2021   GLUCOSE 153 (H) 11/22/2021   CHOL 174 11/21/2021   TRIG 197 (H) 11/21/2021   HDL 27 (L) 11/21/2021   LDLCALC 108 (H) 11/21/2021   ALT 17 11/20/2021   AST 27 11/20/2021   NA 138 11/22/2021   K 4.3  11/22/2021   CL 107 11/22/2021   CREATININE 0.99 11/22/2021   BUN 16 11/22/2021   CO2 22 11/22/2021   INR 1.1 11/21/2021   HGBA1C 7.3 (H) 11/21/2021   Assessment / Plan:   S/p NSTEMI, coronary artery disease-on Heparin drip. He has multivessel CAD (high grade left main disease included) with preserved LVEF 55-65% and would benefit from coronary artery bypass grafting surgery. CTA did show aortic atherosclerosis. Continue pre op work up.  Dr. Lavonna Monarch will evaluate. CABG scheduled for am 2. History of hypertension-on Amlodipine 5 mg daily and Lopressor 12.5 mg bid 3. History of hyperlipidemia-on Atorvastatin 80 mg daily 4. History of OSA-on CPAP 5. History of DM-HGA1C 7.3. He was on Metformin 500 mg at hs prior to admission 6. History of GERD-on Protonix   I  spent 40 minutes counseling the patient face to face.   Lars Pinks PA-C 11/23/2021 7:38 AM  I agree with above and the assessment and plan Pt is a22 yo wm with admission for CP and found to have an NSTEMI Was found on work up to have CAD and LM stenosis Echo with normal LV function I have discussed the risks and goals of surgery with pt and wife and they understand and wish to proceed.

## 2021-11-23 NOTE — Consult Note (Signed)
ANTICOAGULATION CONSULT NOTE- follow-up  Pharmacy Consult for heparin infusion Indication: chest pain/ACS  No Known Allergies  Patient Measurements: Height: '5\' 7"'$  (170.2 cm) Weight: 95.8 kg (211 lb 4.8 oz) IBW/kg (Calculated) : 66.1 Heparin Dosing Weight: 86.8 kg   Vital Signs: Temp: 98.5 F (36.9 C) (09/28 1649) Temp Source: Oral (09/28 1649) BP: 125/76 (09/28 1649) Pulse Rate: 74 (09/28 1649)  Labs: Recent Labs    11/20/21 1956 11/20/21 2156 11/21/21 0548 11/22/21 0151 11/22/21 0425 11/22/21 1147 11/22/21 2252 11/23/21 0910 11/23/21 1857  HGB 14.3  --  13.1  --  12.7*  --   --  13.2  --   HCT 42.2  --  39.9  --  38.3*  --   --  39.0  --   PLT 296  --  231  --  251  --   --  250  --   APTT  --  32  --   --   --   --   --   --   --   LABPROT  --  13.7 14.1  --   --   --   --   --   --   INR  --  1.1 1.1  --   --   --   --   --   --   HEPARINUNFRC  --   --  <0.10*   < >  --    < > 0.34 0.27* 0.33  CREATININE 1.30*  --  0.96  --  0.99  --   --  1.15  --   TROPONINIHS 198* 178*  --   --   --   --   --   --   --    < > = values in this interval not displayed.     Estimated Creatinine Clearance: 64.1 mL/min (by C-G formula based on SCr of 1.15 mg/dL).   Medical History: Past Medical History:  Diagnosis Date   Anxiety    situational   Arthritis    Cancer ([place])    squamous cell carcinoma   ED (erectile dysfunction)    GERD (gastroesophageal reflux disease)    Hyperlipidemia    Hypertension    OSA (obstructive sleep apnea)    CPAP   Stroke ([place])    no residual    Uncontrolled type 2 diabetes mellitus with hyperglycemia, with long-term current use of insulin ([facility])     Medications:  No prior AC noted   Assessment:  71 y.o. male with no prior cardiac history who presents on ED 11/20/21 after he was told he had an abnormal heart blood test earlier today. Reports over the last 2 weeks has been experiencing fatigue and chest pain intermittently. Troponin I  [id number].  Heparin level today is subtherapeutic at 0.27, on 1800 units/hr. Hgb 13.2, plt 250. No line issues or signs/symptoms of bleeding reported  9/28 PM  HL 0.33- therapeutic No signs of bleeding / no issues with the infusion  Goal of Therapy:  Heparin level 0.3-0.7 units/ml Monitor platelets by anticoagulation protocol: Yes  Plan:  Continue heparin gtt at 1950 units/hr Check heparin level and CBC w/ AM labs Monitor heparin level, CBC, and s/sx of bleeding daily    Emmarie Sannes BS, PharmD, BCPS Clinical Pharmacist 11/23/2021 7:54 PM  Contact: [phone] after 3 PM  "Be curious, not judgmental..." -Jamal Maes

## 2021-11-23 NOTE — Consult Note (Addendum)
[address]       [place] [address]             [phone]        Samuel [name] [place] Medical Record #[id number] Date of Birth: 30-Mar-1950  Referring: Isaias Cowman, MD Primary Care: Kirk Ruths, MD Primary Cardiologist:None  Chief Complaint: Chest pain, shortness of breath, fatigue   History of Present Illness:     This is a 71 year old male with a past medical history of DM (type II), hypertension, hyperlipidemia, OSA (on CPAP), obesity, stroke, GERD who presented to [facility] ED on 11/20/2021 with complaints of chest pain with exertion, shortness of breath, and fatigue. Per patient, he had  short of breath with exertion about 3 weeks while golfing with his son. He was doing yard work about 2 weeks ago and had chest pain and shortness of breath. He also noticed he has some pain in his left shoulder. He denies LE edema, nausea, vomiting, or palpitations. EKG showed sinus rhythm, RBBB, and inferior T wave inversion. Initial Troponin I (high sensitivity) was 183 and max'd at 198. He ruled in for a NSTEMI.  D dimer was elevated at 1.15 so CTA chest was done and there was no PE. There is CAD and aortic atherosclerosis. Cardiac catheterization done 11/21/2021 showed LVEF 55-65%, ostial to mid LM with 80% stenosis, ostial to proximal Circumflex 90% stenosis, and ostial to proximal RCA with a 90% stenosis. Echo done 11/21/2021 showed LVEF 70-75%, LV has no regional wall motion abnormalities, and no significant valvular disease. Dr. Saralyn Pilar asked for transfer to [facility] for further evaluation and treatment. A cardiothoracic consultation was requested for consideration of coronary artery bypass grafting surgery. At the time of my evaluation, he denied chest pain or shortness of breath. Wife at bedside and patient somewhat emotional.   Current Activity/ Functional Status: Patient is independent with mobility/ambulation, transfers, ADL's,  IADL's.   Zubrod Score: At the time of surgery this patient's most appropriate activity status/level should be described as: '[]'$     0    Normal activity, no symptoms '[x]'$     1    Restricted in physical strenuous activity but ambulatory, able to do out light work '[]'$     2    Ambulatory and capable of self care, unable to do work activities, up and about more than 50%  Of the time                            '[]'$     3    Only limited self care, in bed greater than 50% of waking hours '[]'$     4    Completely disabled, no self care, confined to bed or chair '[]'$     5    Moribund  Past Medical History:  Diagnosis Date   Anxiety    situational   Arthritis    Cancer ([place])    squamous cell carcinoma   ED (erectile dysfunction)    GERD (gastroesophageal reflux disease)    Hyperlipidemia    Hypertension    OSA (obstructive sleep apnea)    CPAP   Stroke [facility])    He has no residual effects (had speech difficulties and right sided motor weakness with initial stroke.  Per patient, was from hypertension (stress from work)   Uncontrolled type 2 diabetes mellitus with hyperglycemia, with long-term current use of insulin ([place])  Past Surgical History:  Procedure Laterality Date   CARDIAC CATHETERIZATION     COLONOSCOPY     KNEE ARTHROPLASTY Left 03/20/2019   Procedure: LEFT COMPUTER ASSISTED TOTAL KNEE ARTHROPLASTY;  Surgeon: Dereck Leep, MD;  Location: [facility] ORS;  Service: Orthopedics;  Laterality: Left;   KNEE ARTHROPLASTY Right 11/09/2019   Procedure: COMPUTER ASSISTED TOTAL KNEE ARTHROPLASTY;  Surgeon: Dereck Leep, MD;  Location: [facility] ORS;  Service: Orthopedics;  Laterality: Right;   KNEE ARTHROSCOPY Left    LEFT HEART CATH N/A 11/21/2021   Procedure: Left Heart Cath;  Surgeon: Isaias Cowman, MD;  Location: [facility];  Service: Cardiovascular;  Laterality: N/A;   SHOULDER ARTHROSCOPY Right 2009   rotator cuff   TONSILLECTOMY      Social History   Tobacco Use   Smoking Status Never  Smokeless Tobacco Never    Social History   Substance and Sexual Activity  Alcohol Use Yes   Alcohol/week: 2.0 standard drinks of alcohol   Types: 2 Standard drinks or equivalent per week    Allergies: No Known drug or food allergies;however, does have seasonal allergies  Current Facility-Administered Medications  Medication Dose Route Frequency Provider Last Rate Last Admin   acetaminophen (TYLENOL) tablet 650 mg  650 mg Oral Q4H PRN Bhagat, Bhavinkumar, PA       amLODipine (NORVASC) tablet 5 mg  5 mg Oral Daily Bhagat, Bhavinkumar, PA       aspirin EC tablet 81 mg  81 mg Oral Daily Bhagat, Bhavinkumar, PA       atorvastatin (LIPITOR) tablet 80 mg  80 mg Oral Daily Bhagat, Bhavinkumar, PA       heparin ADULT infusion 100 units/mL (25000 units/292m)  1,800 Units/hr Intravenous Continuous RDawayne Cirri RPH 18 mL/hr at 11/23/21 0641 1,800 Units/hr at 11/23/21 0641   hydrocortisone cream 1 % 1 Application  1 Application Topical TID PRN HLeonie Man MD       insulin aspart (novoLOG) injection 0-15 Units  0-15 Units Subcutaneous TID WC Bhagat, Bhavinkumar, PA       loratadine (CLARITIN) tablet 10 mg  10 mg Oral Daily Bhagat, Bhavinkumar, PA       metoprolol tartrate (LOPRESSOR) tablet 12.5 mg  12.5 mg Oral BID Bhagat, Bhavinkumar, PA   12.5 mg at 11/22/21 2120   nitroGLYCERIN (NITROSTAT) SL tablet 0.4 mg  0.4 mg Sublingual Q5 Min x 3 PRN Bhagat, Bhavinkumar, PA       ondansetron (ZOFRAN) injection 4 mg  4 mg Intravenous Q6H PRN Bhagat, Bhavinkumar, PA       Oral care mouth rinse  15 mL Mouth Rinse PRN HLeonie Man MD       oxybutynin (DITROPAN-XL) 24 hr tablet 5 mg  5 mg Oral Daily Bhagat, Bhavinkumar, PA   5 mg at 11/22/21 2121   pantoprazole (PROTONIX) EC tablet 40 mg  40 mg Oral Daily Bhagat, Bhavinkumar, PA        Medications Prior to Admission  Medication Sig Dispense Refill Last Dose   amLODipine (NORVASC) 5 MG tablet Take 5 mg by mouth daily.    11/22/2021   aspirin 81 MG EC tablet Take 81 mg by mouth daily.    11/20/2021   atorvastatin (LIPITOR) 80 MG tablet Take 1 tablet (80 mg total) by mouth daily. 30 tablet 0 11/20/2021   fluticasone (FLONASE) 50 MCG/ACT nasal spray Place 2 sprays into both nostrils as needed for allergies or rhinitis.   11/20/2021  heparin 25000 UT/250ML infusion Inject 1,500 Units/hr into the vein continuous.   11/22/2021 at 1930   loratadine (CLARITIN) 10 MG tablet Take 10 mg by mouth daily.   11/22/2021   Naproxen Sodium (ALEVE PO) Take 1 tablet by mouth as needed (pain).   11/20/2021   omeprazole (PRILOSEC) 20 MG capsule Take 20 mg by mouth daily before breakfast.   11/20/2021   oxybutynin (DITROPAN-XL) 5 MG 24 hr tablet Take 5 mg by mouth daily.   11/20/2021   nitroGLYCERIN (NITROSTAT) 0.4 MG SL tablet Place under the tongue.   never used   RYBELSUS 7 MG TABS Take 1 tablet by mouth daily. (Patient not taking: Reported on 11/22/2021)   Not Taking    Family History  Problem Relation Age of Onset   CAD Sister    CAD Brother   Patient's maternal grandfather  and maternal uncle had CAD Family history of AAA   Review of Systems:     Cardiac Review of Systems: Y or  [  N  ]= no  Chest Pain [ Y-prior to admission   ]  Resting SOB [  N ] Exertional SOB  [Y  ]  Pedal Edema [  N ]     Syncope  [place]licker  ]   Presyncope [ N  ]  General Review of Systems: [Y] = yes [N  ]=no Constitional:  fatigue [  Y]; nausea [ N ]; night sweats [  N]; fever [ N ]; or chills [ N ]                                                               Dental: Last Dentist visit: A few months ago;he goes every 6 months  Eye : Amaurosis fugax[N  ]; Resp: cough [ N ];  wheezing[ N ];  hemoptysis[  N];  GI:  vomiting[N  ];  dmelena[  N];  hematochezia [ N ]; heartburn[  Y];   GU: hematuria[ N ];                Skin: rash, swelling[ N ];,   peripheral edema[ N ];  ]; Musculosketetal: joint swelling[ N ];  joint erythema[ N ];   Heme/Lymph:  bleeding[ N  ];    Neuro: TIA[N  ];    stroke[Y  ];  vertigo[ Y ];  seizures[N  ];     Psych: anxiety[ Y ];  Endocrine: diabetes[Y  ];  thyroid dysfunction[ N ];                Physical Exam: BP (!) 156/90 (BP Location: Right Arm)   Pulse 69   Temp 98.1 F (36.7 C) (Oral)   Resp 18   Ht '5\' 7"'$  (1.702 m)   Wt 95.8 kg   SpO2 97%   BMI 33.09 kg/m    General appearance: alert, cooperative, and no distress Head: Normocephalic, without obvious abnormality, atraumatic Neck: no carotid bruit, no JVD, and supple, symmetrical, trachea midline Resp: clear to auscultation bilaterally Cardio: RRR, no murmur GI: Soft, obese, non tender, bowel sounds present Extremities: No LE edema. Palpable DP and PT bilaterally Neurologic: Grossly normal  Diagnostic Studies & Laboratory data:     Recent Radiology Findings:  EXAM: CT ANGIOGRAPHY CHEST WITH  CONTRAST   TECHNIQUE: Multidetector CT imaging of the chest was performed using the standard protocol during bolus administration of intravenous contrast. Multiplanar CT image reconstructions and MIPs were obtained to evaluate the vascular anatomy.   RADIATION DOSE REDUCTION: This exam was performed according to the departmental dose-optimization program which includes automated exposure control, adjustment of the mA and/or kV according to patient size and/or use of iterative reconstruction technique.   CONTRAST:  46m OMNIPAQUE IOHEXOL 350 MG/ML SOLN   COMPARISON:  None Available.   FINDINGS: Cardiovascular: No filling defects in the pulmonary arteries to suggest pulmonary emboli. Heart is normal size. Aorta is normal caliber. Scattered coronary artery and aortic calcifications.   Mediastinum/Nodes: No mediastinal, hilar, or axillary adenopathy. Trachea and esophagus are unremarkable. Thyroid unremarkable.   Lungs/Pleura: No confluent opacities or effusions.   Upper Abdomen: No acute findings   Musculoskeletal: Chest wall soft tissues are  unremarkable. No acute bony abnormality.   Review of the MIP images confirms the above findings.   IMPRESSION: No evidence of pulmonary embolus.   No acute cardiopulmonary disease.   Coronary artery disease.   Aortic Atherosclerosis (ICD10-I70.0).     Electronically Signed   By: KRolm BaptiseM.D.   On: 11/20/2021 21:21    ECHOCARDIOGRAM COMPLETE  Result Date: 11/22/2021    ECHOCARDIOGRAM REPORT   Patient Name:   Samuel DUBSDate of Exam: 11/21/2021 Medical Rec #:  [id number]   Height:       67.0 in Accession #:    [id number]  Weight:       213.0 lb Date of Birth:  [birth year]   BSA:          2.077 m Patient Age:    89years     BP:           128/78 mmHg Patient Gender: M            HR:           70 bpm. Exam Location:  [facility] Procedure: 2D Echo, Cardiac Doppler and Color Doppler Indications:     I21.4 NSTEMI  History:         Patient has no prior history of Echocardiogram examinations.                  Stroke; Risk Factors:Diabetes, Dyslipidemia, Sleep Apnea and                  Hypertension.  Sonographer:     NCresenciano LickRDCS Referring Phys:  [id number] Diagnosing Phys: DYolonda KidaMD IMPRESSIONS  1. Normal LVF.  2. Left ventricular ejection fraction, by estimation, is 70 to 75%. Left ventricular ejection fraction by PLAX is 75 %. The left ventricle has hyperdynamic function. The left ventricle has no regional wall motion abnormalities. There is mild concentric left ventricular hypertrophy. Left ventricular diastolic parameters were normal.  3. Right ventricular systolic function is normal. The right ventricular size is normal. There is normal pulmonary artery systolic pressure.  4. The mitral valve is normal in structure. No evidence of mitral valve regurgitation.  5. The aortic valve was not assessed. Aortic valve regurgitation is not visualized. FINDINGS  Left Ventricle: Left ventricular ejection fraction, by estimation, is 70 to 75%. Left ventricular ejection  fraction by PLAX is 75 %. The left ventricle has hyperdynamic function. The left ventricle has no regional wall motion abnormalities. The left ventricular internal cavity size was small. There is  mild concentric left ventricular hypertrophy. Left ventricular diastolic parameters were normal. Right Ventricle: The right ventricular size is normal. No increase in right ventricular wall thickness. Right ventricular systolic function is normal. There is normal pulmonary artery systolic pressure. Left Atrium: Left atrial size was normal in size. Right Atrium: Right atrial size was normal in size. Pericardium: There is no evidence of pericardial effusion. Mitral Valve: The mitral valve is normal in structure. No evidence of mitral valve regurgitation. Tricuspid Valve: The tricuspid valve is normal in structure. Tricuspid valve regurgitation is trivial. Aortic Valve: The aortic valve was not assessed. Aortic valve regurgitation is not visualized. Pulmonic Valve: The pulmonic valve was normal in structure. Pulmonic valve regurgitation is not visualized. Aorta: The ascending aorta was not well visualized. IAS/Shunts: No atrial level shunt detected by color flow Doppler. Additional Comments: Normal LVF.  LEFT VENTRICLE PLAX 2D LV EF:         Left            Diastology                ventricular     LV e' medial:    7.50 cm/s                ejection        LV E/e' medial:  13.6                fraction by     LV e' lateral:   9.98 cm/s                PLAX is 75      LV E/e' lateral: 10.2                %. LVIDd:         4.40 cm LVIDs:         2.50 cm LV PW:         1.20 cm LV IVS:        1.20 cm LVOT diam:     1.90 cm LV SV:         75 LV SV Index:   36 LVOT Area:     2.84 cm  RIGHT VENTRICLE             IVC RV Basal diam:  3.40 cm     IVC diam: 1.70 cm RV S prime:     16.33 cm/s TAPSE (M-mode): 2.2 cm LEFT ATRIUM             Index        RIGHT ATRIUM           Index LA diam:        4.40 cm 2.12 cm/m   RA Area:     12.80 cm  LA Vol (A2C):   42.2 ml 20.32 ml/m  RA Volume:   29.50 ml  14.20 ml/m LA Vol (A4C):   45.2 ml 21.76 ml/m LA Biplane Vol: 43.9 ml 21.14 ml/m  AORTIC VALVE LVOT Vmax:   145.50 cm/s LVOT Vmean:  91.200 cm/s LVOT VTI:    0.264 m  AORTA Ao Root diam: 3.60 cm Ao Asc diam:  3.60 cm MITRAL VALVE MV Area (PHT): 3.60 cm     SHUNTS MV Decel Time: 211 msec     Systemic VTI:  0.26 m MV E velocity: 102.00 cm/s  Systemic Diam: 1.90 cm MV A velocity: 96.80 cm/s MV E/A ratio:  1.05 Yolonda Kida MD Electronically  signed by Yolonda Kida MD Signature Date/Time: 11/22/2021/12:57:19 PM    Final    CARDIAC CATHETERIZATION  Result Date: 11/21/2021   Ost LM to Mid LM lesion is 80% stenosed.   Ost Cx to Prox Cx lesion is 90% stenosed.   Prox Cx to Mid Cx lesion is 80% stenosed.   1st Diag lesion is 60% stenosed.   Ost RCA to Prox RCA lesion is 90% stenosed.   Prox RCA lesion is 40% stenosed.   Mid RCA to Dist RCA lesion is 80% stenosed.   Dist RCA lesion is 60% stenosed.   The left ventricular systolic function is normal.   LV end diastolic pressure is moderately elevated.   The left ventricular ejection fraction is 55-65% by visual estimate. 1.  NSTEMI 2.  Left main and three-vessel coronary artery disease 80% stenosis left main, 60% stenosis D1, 90% stenosis ostial left circumflex, 90% stenosis ostial RCA, 80% stenosis mid to distal RCA 3.  Preserved left ventricular function Recommendations CABG    Coronary Diagrams  Diagnostic Dominance: Right  Intervention  I have independently reviewed the above radiologic studies and discussed with the patient   Recent Lab Findings: Lab Results  Component Value Date   WBC 9.3 11/22/2021   HGB 12.7 (L) 11/22/2021   HCT 38.3 (L) 11/22/2021   PLT 251 11/22/2021   GLUCOSE 153 (H) 11/22/2021   CHOL 174 11/21/2021   TRIG 197 (H) 11/21/2021   HDL 27 (L) 11/21/2021   LDLCALC 108 (H) 11/21/2021   ALT 17 11/20/2021   AST 27 11/20/2021   NA 138 11/22/2021   K 4.3  11/22/2021   CL 107 11/22/2021   CREATININE 0.99 11/22/2021   BUN 16 11/22/2021   CO2 22 11/22/2021   INR 1.1 11/21/2021   HGBA1C 7.3 (H) 11/21/2021   Assessment / Plan:   S/p NSTEMI, coronary artery disease-on Heparin drip. He has multivessel CAD (high grade left main disease included) with preserved LVEF 55-65% and would benefit from coronary artery bypass grafting surgery. CTA did show aortic atherosclerosis. Continue pre op work up.  Dr. Lavonna Monarch will evaluate. CABG scheduled for am 2. History of hypertension-on Amlodipine 5 mg daily and Lopressor 12.5 mg bid 3. History of hyperlipidemia-on Atorvastatin 80 mg daily 4. History of OSA-on CPAP 5. History of DM-HGA1C 7.3. He was on Metformin 500 mg at hs prior to admission 6. History of GERD-on Protonix   I  spent 40 minutes counseling the patient face to face.   Lars Pinks PA-C 11/23/2021 7:38 AM  I agree with above and the assessment and plan Pt is a7 yo wm with admission for CP and found to have an NSTEMI Was found on work up to have CAD and LM stenosis Echo with normal LV function I have discussed the risks and goals of surgery with pt and wife and they understand and wish to proceed.

## 2021-11-23 NOTE — Progress Notes (Addendum)
Rounding Note    Patient Name: Samuel [name] Date of Encounter: 11/23/2021  [facility] Cardiologist: Dr. Saralyn Pilar  Subjective   No chest pain or shortness of breath.  Plan for CABG tomorrow.  Inpatient Medications    Scheduled Meds:  amLODipine  5 mg Oral Daily   aspirin EC  81 mg Oral Daily   atorvastatin  80 mg Oral Daily   insulin aspart  0-15 Units Subcutaneous TID WC   loratadine  10 mg Oral Daily   metoprolol tartrate  12.5 mg Oral BID   oxybutynin  5 mg Oral Daily   pantoprazole  40 mg Oral Daily   Continuous Infusions:  heparin 1,800 Units/hr (11/23/21 0641)   PRN Meds: acetaminophen, hydrocortisone cream, nitroGLYCERIN, ondansetron (ZOFRAN) IV, mouth rinse   Vital Signs    Vitals:   11/23/21 0539 11/23/21 0805 11/23/21 1025 11/23/21 1026  BP: (!) 156/90 120/83 (!) 150/88   Pulse: 69 79  70  Resp:  18    Temp: 98.1 F (36.7 C) 97.9 F (36.6 C)    TempSrc: Oral Oral    SpO2: 97% 97%    Weight:      Height:        Intake/Output Summary (Last 24 hours) at 11/23/2021 1059 Last data filed at 11/23/2021 0700 Gross per 24 hour  Intake 1023.33 ml  Output 775 ml  Net 248.33 ml      11/22/2021    4:34 PM 11/21/2021   10:56 AM 11/20/2021    8:13 PM  Last 3 Weights  Weight (lbs) 211 lb 4.8 oz 213 lb 215 lb 9.8 oz  Weight (kg) 95.845 kg 96.616 kg 97.8 kg      Telemetry    Sinus rhythm, PVC- Personally Reviewed  ECG    N/A  Physical Exam   GEN: No acute distress.   Neck: No JVD; bilateral bruits Cardiac: RRR, no murmurs, rubs, or gallops.  Respiratory: Clear to auscultation bilaterally. GI: Soft, nontender, non-distended  MS: No edema; No deformity. Neuro:  Nonfocal  Psych: Normal affect   Labs    High Sensitivity Troponin:   Recent Labs  Lab 11/20/21 1744 11/20/21 1956 11/20/21 2156  TROPONINIHS 183* 198* 178*     Chemistry Recent Labs  Lab 11/20/21 1956 11/21/21 0548 11/22/21 0425 11/23/21 0910  NA 137 135 138  136  K 4.3 3.9 4.3 4.0  CL 103 107 107 104  CO2 19* '22 22 22  '$ GLUCOSE 302* 162* 153* 182*  BUN 24* '23 16 13  '$ CREATININE 1.30* 0.96 0.99 1.15  CALCIUM 9.7 8.9 8.9 9.2  PROT 7.9  --   --   --   ALBUMIN 3.9  --   --   --   AST 27  --   --   --   ALT 17  --   --   --   ALKPHOS 65  --   --   --   BILITOT 0.6  --   --   --   GFRNONAA 58* >60 >60 >60  ANIONGAP '15 6 9 10    '$ Lipids  Recent Labs  Lab 11/21/21 0548  CHOL 174  TRIG 197*  HDL 27*  LDLCALC 108*  CHOLHDL 6.4    Hematology Recent Labs  Lab 11/21/21 0548 11/22/21 0425 11/23/21 0910  WBC 9.1 9.3 8.6  RBC 4.60 4.45 4.51  HGB 13.1 12.7* 13.2  HCT 39.9 38.3* 39.0  MCV 86.7 86.1 86.5  MCH  28.5 28.5 29.3  MCHC 32.8 33.2 33.8  RDW 14.3 14.1 14.3  PLT 231 251 250   Thyroid No results for input(s): "TSH", "FREET4" in the last 168 hours.  BNP Recent Labs  Lab 11/20/21 1958  BNP 35.4    DDimer  Recent Labs  Lab 11/20/21 1744  DDIMER 1.15*     Radiology     Cardiac Studies   Echo 11/21/21: 1. Normal LVF. EF 70-75%.  No RWMA.  Mild concentric LVH. "  Normal diastolic parameters for age "".  Normal RV with RVSP.  Essentially normal valves.   Left Heart Cath 11/21/21: 1.  NSTEMI.  2.  Left main and 3V CAD:  80% stenosis left main, 60% stenosis D1, 90% stenosis ostial left circumflex, 90% stenosis ostial RCA, 80% stenosis mid to distal RCA.  3.  Preserved left ventricular function    Recommendations: CABG   Diagnostic  Dominance: Right:   Ost LM to Mid LM lesion is 80% stenosed.   1st Diag lesion is 60% stenosed:    Ost Cx to Prox Cx lesion is 90% stenosed.   Prox Cx to Mid Cx lesion is 80% stenosed:    Ost RCA to Prox  RCA lesion is 90% stenosed.   Prox RCA lesion is 40% stenosed.   Mid RCA to Dist RCA lesion is 80% stenosed.   Dist RCA lesion is 60% stenosed:    Normal LV systolic function EF 53-29%.  Moderate elevated LVEDP.       Carotid Dopplers -preliminary Dopplers revealed significant bilateral carotid  disease.  Patient Profile     71 y.o. male  with hypertension, hyperlipidemia, diabetes mellitus -2 , morbid obesity and sleep apnea (on CPAP) who is being seen 11/22/2021 for the evaluation of severe multivessel CAD including ostial LM and ostial RCA, ostial LCx disease found as part of evaluation for progressive angina. He is transferred by Dr. Saralyn Pilar for CABG evaluation after identifying the above CAD on cardiac catheterization.  Assessment & Plan    Principal Problem:   NSTEMI (non-ST elevated myocardial infarction) [facility]) Active Problems:   Acute myocardial infarction (non-STEMI) involving left main coronary artery (HCC)   Coronary artery disease involving native coronary artery of native heart with unstable angina pectoris ([place])   Dyslipidemia associated with type 2 diabetes mellitus ([place])   Uncontrolled type 2 diabetes mellitus with hyperglycemia, without long-term current use of insulin (HCC)   Essential hypertension   Carotid artery stenosis, asymptomatic, bilateral   OSA on CPAP   MV/LM CAD/NSTEMI Patient with 2-weeks history of progressive worsening anginal symptoms.  Cardiac catheterization with left main and three-vessel severe CAD as summarizing above.  [facility] for CABG evaluation.  Currently chest pain-free.   - Seen by CTCS and for OR tomorrow - Continue IV heparin, aspirin 81 mg, Lipitor 80 mg, metoprolol 12.'5mg'$  BID   2.  Hypertension -Continue amlodipine -Continue to hold lisinopril/HCTZ -Added beta-blocker - BP relatively stable    3.  Diabetes mellitus, type II with circulatory complications. -Hemoglobin A1c 7.3 -Sliding scale insulin   4.  OSA on CPAP (using home CPAP)   5. HLD -11/21/2021: Cholesterol 174; HDL 27; LDL Cholesterol 108; Triglycerides 197; VLDL 39  - Continue Lipitor '80mg'$  qd   6.  Bilateral carotid disease-vascular surgery consulted.  Prolonged discussion and shared decision making, the decision was to proceed with  CABG and then plan staged CEA/TCAR in 6 to 8 weeks following CABG.  Felt that  the risks of a double operation were greater than the risk of staged.   For questions or updates, please contact [place] Please consult [email or web address] for contact info under        Signed, Leanor Kail, PA  11/23/2021, 10:59 AM      ATTENDING ATTESTATION  I have seen, examined and evaluated the patient this AM along with Mr. Curly [place], [place].  After reviewing all the available data and chart, we discussed the patients laboratory, study & physical findings as well as symptoms in detail.  I agree with his findings, examination as well as impression recommendations as per our discussion.    Attending adjustments noted in italics.   Doing well.  Happy to have the surgery scheduled for tomorrow. Continue aggressive instructor modification and guideline directed medical therapy. With diabetes mellitus (in a somewhat obese gentleman), would consider adding GLP-1 agonist prior to discharge. BP has been somewhat labile, would hold off on titrating medications until we see how he does postop.  We will follow along postop and be available if necessary for assistance.  Please notify us when he is being discharged so we can ensure that he has adequate follow-up scheduled with his primary cardiologist.   Samuel Man, MD, MS Glenetta Hew, M.D., M.S. Interventional Cardiologist  [facility]  Pager # [phone] Phone # [phone] [address] [place], [place] [address]

## 2021-11-23 NOTE — Anesthesia Preprocedure Evaluation (Addendum)
Anesthesia Evaluation  Patient identified by MRN, date of birth, ID band Patient awake    Reviewed: Allergy & Precautions, NPO status , Patient's Chart, lab work & pertinent test results  Airway Mallampati: II       Dental   Pulmonary sleep apnea ,    breath sounds clear to auscultation       Cardiovascular hypertension, + angina + CAD and + Past MI   Rhythm:Regular Rate:Normal     Neuro/Psych CVA    GI/Hepatic Neg liver ROS, GERD  ,  Endo/Other  diabetes  Renal/GU Renal disease     Musculoskeletal   Abdominal   Peds  Hematology   Anesthesia Other Findings   Reproductive/Obstetrics                            Anesthesia Physical Anesthesia Plan  ASA: 3  Anesthesia Plan: General   Post-op Pain Management:    Induction: Intravenous  PONV Risk Score and Plan: Treatment may vary due to age or medical condition, Ondansetron, Dexamethasone and Midazolam  Airway Management Planned: Oral ETT  Additional Equipment: Arterial line, PA Cath and Ultrasound Guidance Line Placement  Intra-op Plan:   Post-operative Plan:   Informed Consent: I have reviewed the patients History and Physical, chart, labs and discussed the procedure including the risks, benefits and alternatives for the proposed anesthesia with the patient or authorized representative who has indicated his/her understanding and acceptance.     Dental advisory given  Plan Discussed with: CRNA and Anesthesiologist  Anesthesia Plan Comments:         Anesthesia Quick Evaluation

## 2021-11-23 NOTE — Consult Note (Addendum)
Hospital Consult    Reason for Consult:  carotid stenosis Requesting Physician:  Tacy Dura [facility] MRN #:  [id number]  History of Present Illness: This is a 71 y.o. male who presented to the hospital with chest pain, sob and fatigue.  He has had some intermittent episodes over the past few weeks.  At the ER in [place], he was found to have a RBBB, inferior T wave inversion and elevated troponin.  and he was ruled in for STEMI. 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  During his work up, he was found to have 80-99% right ICA stenosis and 60-79% left ICA stenosis. His ABI's are within normal range.  He has been placed on heparin gtt.    Pt in telemetry room with his wife of 54 years and his sisters.    Pt has hx of stroke in 1997 when he had right sided weakness and it was thought to be due to stress with his job.   He does not have any residual weakness.  He denies any temporary vision loss, speech difficulties, unilateral weakness, paralysis or numbness or facial droop.  He is on Lipitor and asa prior to admission.    Pt does have DM and his A1c was 7.3.   He has never really smoked except in high school.  There is strong family hx of CAD and carotid disease.  They also tell me there is family hx of AAA and he was screened about 13 years ago as was his siblings.    He stays active playing golf.  He denies any claudication, rest pain or non healing wounds.    The pt is on a statin for cholesterol management.  The pt is on a daily aspirin.   Other AC:  heparin gtt The pt is on CCB, BB for hypertension.   The pt is diabetic.   Tobacco hx:  never  Past Medical History:  Diagnosis Date   Anxiety    situational   Arthritis    Cancer ([place])    squamous cell carcinoma   ED (erectile dysfunction)    GERD (gastroesophageal reflux disease)    Hyperlipidemia    Hypertension    OSA (obstructive sleep apnea)    CPAP   Stroke ([place])    no residual     Uncontrolled type 2 diabetes mellitus with hyperglycemia, with long-term current use of insulin ([place])     Past Surgical History:  Procedure Laterality Date   CARDIAC CATHETERIZATION     COLONOSCOPY     KNEE ARTHROPLASTY Left 03/20/2019   Procedure: LEFT COMPUTER ASSISTED TOTAL KNEE ARTHROPLASTY;  Surgeon: Dereck Leep, MD;  Location: [facility] ORS;  Service: Orthopedics;  Laterality: Left;   KNEE ARTHROPLASTY Right 11/09/2019   Procedure: COMPUTER ASSISTED TOTAL KNEE ARTHROPLASTY;  Surgeon: Dereck Leep, MD;  Location: [facility] ORS;  Service: Orthopedics;  Laterality: Right;   KNEE ARTHROSCOPY Left    LEFT HEART CATH N/A 11/21/2021   Procedure: Left Heart Cath;  Surgeon: Isaias Cowman, MD;  Location: [place] [facility] LAB;  Service: Cardiovascular;  Laterality: N/A;   SHOULDER ARTHROSCOPY Right 2009   rotator cuff   TONSILLECTOMY      No Known Allergies  Prior to Admission medications   Medication Sig Start Date End Date Taking? Authorizing Provider  amLODipine (NORVASC) 5 MG tablet Take 5 mg by mouth daily.   Yes [provider]  aspirin 81 MG EC tablet Take  81 mg by mouth daily.    Yes [provider]  atorvastatin (LIPITOR) 80 MG tablet Take 1 tablet (80 mg total) by mouth daily. 11/21/21 11/21/22 Yes Lavina Hamman, MD  fluticasone Asencion Islam) 50 MCG/ACT nasal spray Place 2 sprays into both nostrils as needed for allergies or rhinitis.   Yes [provider]  heparin 25000 UT/250ML infusion Inject 1,500 Units/hr into the vein continuous. 11/21/21  Yes Lavina Hamman, MD  loratadine (CLARITIN) 10 MG tablet Take 10 mg by mouth daily.   Yes [provider]  Naproxen Sodium (ALEVE PO) Take 1 tablet by mouth as needed (pain).   Yes [provider]  omeprazole (PRILOSEC) 20 MG capsule Take 20 mg by mouth daily before breakfast.   Yes [provider]  oxybutynin (DITROPAN-XL) 5 MG 24 hr tablet Take 5 mg by mouth daily. 09/12/21  Yes  [provider]  nitroGLYCERIN (NITROSTAT) 0.4 MG SL tablet Place under the tongue. 11/20/21   [provider]  RYBELSUS 7 MG TABS Take 1 tablet by mouth daily. Patient not taking: Reported on 11/22/2021 06/30/21   [provider]    Social History   Socioeconomic History   Marital status: Married    Spouse name: Not on file   Number of children: 3   Years of education: 12   Highest education level: Some college, no degree  Occupational History   Not on file  Tobacco Use   Smoking status: Never   Smokeless tobacco: Never  Vaping Use   Vaping Use: Never used  Substance and Sexual Activity   Alcohol use: Yes    Alcohol/week: 2.0 standard drinks of alcohol    Types: 2 Standard drinks or equivalent per week   Drug use: Never   Sexual activity: Not Currently    Birth control/protection: Post-menopausal  Other Topics Concern   Not on file  Social History Narrative   Not on file   Social Determinants of Health   Financial Resource Strain: Not on file  Food Insecurity: No Food Insecurity (11/22/2021)   Hunger Vital Sign    Worried About Running Out of Food in the Last Year: Never true    Ran Out of Food in the Last Year: Never true  Transportation Needs: No Transportation Needs (11/22/2021)   PRAPARE - Hydrologist (Medical): No    Lack of Transportation (Non-Medical): No  Physical Activity: Not on file  Stress: Not on file  Social Connections: Not on file  Intimate Partner Violence: Not At Risk (11/22/2021)   Humiliation, Afraid, Rape, and Kick questionnaire    Fear of Current or Ex-Partner: No    Emotionally Abused: No    Physically Abused: No    Sexually Abused: No    Family History  Problem Relation Age of Onset   CAD Sister    CAD Brother     ROS: '[x]'$  Positive   '[ ]'$  Negative   '[ ]'$  All sytems reviewed and are negative  Cardiac: '[x]'$  chest pain/pressure '[x]'$  hx MI '[x]'$  SOB   Vascular: '[]'$  pain in legs while  walking '[]'$  pain in legs at rest '[]'$  pain in legs at night '[]'$  non-healing ulcers  Pulmonary: '[x]'$  OSA   Neurologic: '[x]'$  hx of CVA-no residual '[]'$  mini stroke   Hematologic: '[x]'$  hx of cancer-SCC  Endocrine:   '[x]'$  diabetes '[]'$  thyroid disease  GI '[x]'$  GERD  GU: '[]'$  CKD/renal failure '[]'$  HD--'[]'$  M/W/F or '[]'$  T/T/S  Psychiatric: '[x]'$  anxiety '[]'$  depression  Musculoskeletal: '[x]'$  arthritis '[]'$  joint pain  Integumentary: '[]'$  rashes '[]'$  ulcers  Constitutional: '[]'$  fever  '[]'$  chills  Physical Examination  Vitals:   11/23/21 1026 11/23/21 1205  BP:  124/78  Pulse: 70 66  Resp:  16  Temp:  98.7 F (37.1 C)  SpO2:  97%   Body mass index is 33.09 kg/m.  General:  WDWN in NAD Gait: Not observed HENT: WNL, normocephalic Pulmonary: normal non-labored breathing Cardiac: regular, without carotid bruits Abdomen:  soft, NT; aortic pulse is not palpable Skin: without rashes Vascular Exam/Pulses:  Right Left  Radial 2+ (normal) 2+ (normal)  DP 2+ (normal) 2+ (normal)  PT Unable to palpate Unable to palpate   Extremities: no non healing ulcers; no swelling Musculoskeletal: no muscle wasting or atrophy  Neurologic: A&O X 3 Psychiatric:  The pt has Normal affect.   CBC    Component Value Date/Time   WBC 8.6 11/23/2021 0910   RBC 4.51 11/23/2021 0910   HGB 13.2 11/23/2021 0910   HCT 39.0 11/23/2021 0910   PLT 250 11/23/2021 0910   MCV 86.5 11/23/2021 0910   MCH 29.3 11/23/2021 0910   MCHC 33.8 11/23/2021 0910   RDW 14.3 11/23/2021 0910   LYMPHSABS 3.0 11/20/2021 1956   MONOABS 0.5 11/20/2021 1956   EOSABS 0.2 11/20/2021 1956   BASOSABS 0.0 11/20/2021 1956    BMET    Component Value Date/Time   NA 136 11/23/2021 0910   K 4.0 11/23/2021 0910   CL 104 11/23/2021 0910   CO2 22 11/23/2021 0910   GLUCOSE 182 (H) 11/23/2021 0910   BUN 13 11/23/2021 0910   CREATININE 1.15 11/23/2021 0910   CALCIUM 9.2 11/23/2021 0910   GFRNONAA >60 11/23/2021 0910   GFRAA >60  11/03/2019 1402    COAGS: Lab Results  Component Value Date   INR 1.1 11/21/2021   INR 1.1 11/20/2021   INR 1.0 11/03/2019     Non-Invasive Vascular Imaging:   Carotid duplex 11/23/2021: Right:  80-99% ICA stenosis Left:  60-79% ICA stenosis   ASSESSMENT/PLAN: This is a 71 y.o. 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  He has not had any amaurosis fugax, speech issues or unilateral weakness, numbness, paralysis.  He has right ICA stenosis of >80% and left ICA stenosis in the 60-79% range.  Fortunately he is not a smoker.  If no intervention with CABG, will need right carotid intervention once he recovers from CABG.  Of note, pt has easily palpable DP pulses bilaterally without claudication, rest pain or non healing wounds.   Family hx AAA -he does have family hx of AAA and was screened 13 years ago, which was negative.   -Dr. Stanford Breed will be by this afternoon to evaluate pt and determine further plan  -continue asa/statin from vascular standpoint  Leontine Locket, PA-C [facility] [phone]  VASCULAR STAFF ADDENDUM: I have independently interviewed and examined the patient. I agree with the above.  33yM with CAD in need of CABG. Preoperative evaluation shows R 80-99% carotid artery stenosis; L 60-79% stenosis.  He suffered a stroke in the 90s, but recovered neurologic function. He reports no stroke or TIA in the last 6 months. He specifically denies any history of unilateral weakness, amaurosis, slurred speech, facial droop in the last 6 months. He has a normal neurologic exam, and easily palpable  popliteal and dorsalis pedis pulses bilaterally.  We do lengthy discussion about carotid artery stenosis in patients that need CABG.  I reviewed the available data with him.  Given his asymptomatic status, and absence of high risk features outside of unilateral severe  stenosis, I would recommend proceeding with CABG without first revascularizing the carotid artery.  I counseled him and his wife that he does have a slightly higher than average risk of perioperative stroke, but a combined carotid endarterectomy and CABG increases the risk profile of both surgeries.  Ultimately, we both agreed to reevaluate the carotid artery stenosis and about 6 weeks once he is recovered from CABG.  I counseled him that if his carotid artery stenosis became symptomatic during the perioperative period, we would likely pursue an urgent endarterectomy.   We will check on the patient over the weekend and I will see him again on Monday. We will plan to pursue TCAR or CEA about 6 weeks after CABG if patient is recovering well.  Yevonne Aline. Stanford Breed, MD [facility] Phone Number: [phone] 11/23/2021 3:17 PM

## 2021-11-23 NOTE — Consult Note (Signed)
ANTICOAGULATION CONSULT NOTE   Pharmacy Consult for heparin infusion Indication: chest pain/ACS  No Known Allergies  Patient Measurements: Height: '5\' 7"'$  (170.2 cm) Weight: 95.8 kg (211 lb 4.8 oz) IBW/kg (Calculated) : 66.1 Heparin Dosing Weight: 86.8 kg   Vital Signs: Temp: 97.9 F (36.6 C) (09/28 0805) Temp Source: Oral (09/28 0805) BP: 150/88 (09/28 1025) Pulse Rate: 70 (09/28 1026)  Labs: Recent Labs    11/20/21 1744 11/20/21 1956 11/20/21 1956 11/20/21 2156 11/21/21 0548 11/22/21 0151 11/22/21 0425 11/22/21 1147 11/22/21 2252 11/23/21 0910  HGB  --  14.3   < >  --  13.1  --  12.7*  --   --  13.2  HCT  --  42.2   < >  --  39.9  --  38.3*  --   --  39.0  PLT  --  296   < >  --  231  --  251  --   --  250  APTT  --   --   --  32  --   --   --   --   --   --   LABPROT  --   --   --  13.7 14.1  --   --   --   --   --   INR  --   --   --  1.1 1.1  --   --   --   --   --   HEPARINUNFRC  --   --   --   --  <0.10*   < >  --  0.25* 0.34 0.27*  CREATININE  --  1.30*  --   --  0.96  --  0.99  --   --   --   TROPONINIHS 183* 198*  --  178*  --   --   --   --   --   --    < > = values in this interval not displayed.     Estimated Creatinine Clearance: 74.4 mL/min (by C-G formula based on SCr of 0.99 mg/dL).   Medical History: Past Medical History:  Diagnosis Date   Anxiety    situational   Arthritis    Cancer ([place])    squamous cell carcinoma   ED (erectile dysfunction)    GERD (gastroesophageal reflux disease)    Hyperlipidemia    Hypertension    OSA (obstructive sleep apnea)    CPAP   Stroke ([place])    no residual    Uncontrolled type 2 diabetes mellitus with hyperglycemia, with long-term current use of insulin ([facility])     Medications:  No prior AC noted   Assessment:  71 y.o. male with no prior cardiac history who presents on ED 11/20/21 after he was told he had an abnormal heart blood test earlier today. Reports over the last 2 weeks has been experiencing  fatigue and chest pain intermittently. Troponin I [id number].  Heparin level today is subtherapeutic at 0.27, on 1800 units/hr. Hgb 13.2, plt 250. No line issues or signs/symptoms of bleeding reported.  Goal of Therapy:  Heparin level 0.3-0.7 units/ml Monitor platelets by anticoagulation protocol: Yes  Plan:  Increase heparin gtt to 1950 units/hr Check heparin level in 8hrs Monitor heparin level, CBC, and s/sx of bleeding daily    Billey Gosling, PharmD Clinical Pharmacist   11/23/2021,10:41 AM

## 2021-11-24 ENCOUNTER — Inpatient Hospital Stay (HOSPITAL_COMMUNITY): Payer: Medicare Other

## 2021-11-24 ENCOUNTER — Inpatient Hospital Stay (HOSPITAL_COMMUNITY)
Admission: AD | Disposition: A | Discharge status: Home / Self Care | Payer: Self-pay | Attending: Thoracic Surgery (Cardiothoracic Vascular Surgery)

## 2021-11-24 ENCOUNTER — Inpatient Hospital Stay (HOSPITAL_COMMUNITY): Payer: Medicare Other | Admitting: Anesthesiology

## 2021-11-24 ENCOUNTER — Encounter (HOSPITAL_COMMUNITY): Payer: Self-pay | Admitting: Thoracic Surgery (Cardiothoracic Vascular Surgery)

## 2021-11-24 ENCOUNTER — Other Ambulatory Visit: Payer: Self-pay

## 2021-11-24 DIAGNOSIS — G4730 Sleep apnea, unspecified: Secondary | ICD-10-CM

## 2021-11-24 DIAGNOSIS — Z951 Presence of aortocoronary bypass graft: Principal | ICD-10-CM

## 2021-11-24 DIAGNOSIS — Z9911 Dependence on respirator [ventilator] status: Secondary | ICD-10-CM

## 2021-11-24 DIAGNOSIS — Z7984 Long term (current) use of oral hypoglycemic drugs: Secondary | ICD-10-CM

## 2021-11-24 DIAGNOSIS — E119 Type 2 diabetes mellitus without complications: Secondary | ICD-10-CM

## 2021-11-24 DIAGNOSIS — I2510 Atherosclerotic heart disease of native coronary artery without angina pectoris: Secondary | ICD-10-CM

## 2021-11-24 DIAGNOSIS — I10 Essential (primary) hypertension: Secondary | ICD-10-CM

## 2021-11-24 DIAGNOSIS — I25119 Atherosclerotic heart disease of native coronary artery with unspecified angina pectoris: Secondary | ICD-10-CM

## 2021-11-24 DIAGNOSIS — R739 Hyperglycemia, unspecified: Secondary | ICD-10-CM

## 2021-11-24 HISTORY — PX: EXPLORATION POST OPERATIVE OPEN HEART: SHX5061

## 2021-11-24 HISTORY — PX: CORONARY ARTERY BYPASS GRAFT: SHX141

## 2021-11-24 HISTORY — PX: TEE WITHOUT CARDIOVERSION: SHX5443

## 2021-11-24 LAB — CBC
HCT: 41.2 % (ref 39.0–52.0)
HCT: 30.4 % — ABNORMAL LOW (ref 39.0–52.0)
HCT: 29.4 % — ABNORMAL LOW (ref 39.0–52.0)
Hemoglobin: 14.3 g/dL (ref 13.0–17.0)
Hemoglobin: 10.2 g/dL — ABNORMAL LOW (ref 13.0–17.0)
Hemoglobin: 9.6 g/dL — ABNORMAL LOW (ref 13.0–17.0)
MCH: 29.6 pg (ref 26.0–34.0)
MCH: 29.7 pg (ref 26.0–34.0)
MCH: 29.3 pg (ref 26.0–34.0)
MCHC: 34.7 g/dL (ref 30.0–36.0)
MCHC: 33.6 g/dL (ref 30.0–36.0)
MCHC: 32.7 g/dL (ref 30.0–36.0)
MCV: 85.3 fL (ref 80.0–100.0)
MCV: 88.4 fL (ref 80.0–100.0)
MCV: 89.6 fL (ref 80.0–100.0)
Platelets: 249 10*3/uL (ref 150–400)
Platelets: 221 10*3/uL (ref 150–400)
Platelets: 212 10*3/uL (ref 150–400)
RBC: 4.83 MIL/uL (ref 4.22–5.81)
RBC: 3.44 MIL/uL — ABNORMAL LOW (ref 4.22–5.81)
RBC: 3.28 MIL/uL — ABNORMAL LOW (ref 4.22–5.81)
RDW: 14.3 % (ref 11.5–15.5)
RDW: 14.4 % (ref 11.5–15.5)
RDW: 14.5 % (ref 11.5–15.5)
WBC: 9.5 10*3/uL (ref 4.0–10.5)
WBC: 22.2 10*3/uL — ABNORMAL HIGH (ref 4.0–10.5)
WBC: 15.2 10*3/uL — ABNORMAL HIGH (ref 4.0–10.5)
nRBC: 0 % (ref 0.0–0.2)
nRBC: 0 % (ref 0.0–0.2)
nRBC: 0 % (ref 0.0–0.2)

## 2021-11-24 LAB — POCT I-STAT 7, (LYTES, BLD GAS, ICA,H+H)
Acid-Base Excess: 0 mmol/L (ref 0.0–2.0)
Acid-base deficit: 4 mmol/L — ABNORMAL HIGH (ref 0.0–2.0)
Acid-base deficit: 1 mmol/L (ref 0.0–2.0)
Acid-base deficit: 2 mmol/L (ref 0.0–2.0)
Acid-base deficit: 4 mmol/L — ABNORMAL HIGH (ref 0.0–2.0)
Acid-base deficit: 5 mmol/L — ABNORMAL HIGH (ref 0.0–2.0)
Acid-base deficit: 4 mmol/L — ABNORMAL HIGH (ref 0.0–2.0)
Acid-base deficit: 3 mmol/L — ABNORMAL HIGH (ref 0.0–2.0)
Bicarbonate: 21.7 mmol/L (ref 20.0–28.0)
Bicarbonate: 26.1 mmol/L (ref 20.0–28.0)
Bicarbonate: 23.6 mmol/L (ref 20.0–28.0)
Bicarbonate: 24.3 mmol/L (ref 20.0–28.0)
Bicarbonate: 22.7 mmol/L (ref 20.0–28.0)
Bicarbonate: 21.7 mmol/L (ref 20.0–28.0)
Bicarbonate: 20.7 mmol/L (ref 20.0–28.0)
Bicarbonate: 23.3 mmol/L (ref 20.0–28.0)
Calcium, Ion: 1.28 mmol/L (ref 1.15–1.40)
Calcium, Ion: 1.05 mmol/L — ABNORMAL LOW (ref 1.15–1.40)
Calcium, Ion: 1.11 mmol/L — ABNORMAL LOW (ref 1.15–1.40)
Calcium, Ion: 1.12 mmol/L — ABNORMAL LOW (ref 1.15–1.40)
Calcium, Ion: 1.17 mmol/L (ref 1.15–1.40)
Calcium, Ion: 1.18 mmol/L (ref 1.15–1.40)
Calcium, Ion: 1.16 mmol/L (ref 1.15–1.40)
Calcium, Ion: 1.16 mmol/L (ref 1.15–1.40)
HCT: 36 % — ABNORMAL LOW (ref 39.0–52.0)
HCT: 28 % — ABNORMAL LOW (ref 39.0–52.0)
HCT: 28 % — ABNORMAL LOW (ref 39.0–52.0)
HCT: 28 % — ABNORMAL LOW (ref 39.0–52.0)
HCT: 31 % — ABNORMAL LOW (ref 39.0–52.0)
HCT: 29 % — ABNORMAL LOW (ref 39.0–52.0)
HCT: 28 % — ABNORMAL LOW (ref 39.0–52.0)
HCT: 28 % — ABNORMAL LOW (ref 39.0–52.0)
Hemoglobin: 12.2 g/dL — ABNORMAL LOW (ref 13.0–17.0)
Hemoglobin: 9.5 g/dL — ABNORMAL LOW (ref 13.0–17.0)
Hemoglobin: 9.5 g/dL — ABNORMAL LOW (ref 13.0–17.0)
Hemoglobin: 9.5 g/dL — ABNORMAL LOW (ref 13.0–17.0)
Hemoglobin: 10.5 g/dL — ABNORMAL LOW (ref 13.0–17.0)
Hemoglobin: 9.9 g/dL — ABNORMAL LOW (ref 13.0–17.0)
Hemoglobin: 9.5 g/dL — ABNORMAL LOW (ref 13.0–17.0)
Hemoglobin: 9.5 g/dL — ABNORMAL LOW (ref 13.0–17.0)
O2 Saturation: 99 %
O2 Saturation: 100 %
O2 Saturation: 100 %
O2 Saturation: 100 %
O2 Saturation: 98 %
O2 Saturation: 99 %
O2 Saturation: 98 %
O2 Saturation: 87 %
Patient temperature: 36
Patient temperature: 36
Patient temperature: 36.4
Patient temperature: 36.5
Potassium: 4.5 mmol/L (ref 3.5–5.1)
Potassium: 4.8 mmol/L (ref 3.5–5.1)
Potassium: 5.3 mmol/L — ABNORMAL HIGH (ref 3.5–5.1)
Potassium: 4.5 mmol/L (ref 3.5–5.1)
Potassium: 4.4 mmol/L (ref 3.5–5.1)
Potassium: 4.5 mmol/L (ref 3.5–5.1)
Potassium: 4.7 mmol/L (ref 3.5–5.1)
Potassium: 5.2 mmol/L — ABNORMAL HIGH (ref 3.5–5.1)
Sodium: 136 mmol/L (ref 135–145)
Sodium: 138 mmol/L (ref 135–145)
Sodium: 135 mmol/L (ref 135–145)
Sodium: 136 mmol/L (ref 135–145)
Sodium: 138 mmol/L (ref 135–145)
Sodium: 138 mmol/L (ref 135–145)
Sodium: 138 mmol/L (ref 135–145)
Sodium: 138 mmol/L (ref 135–145)
TCO2: 23 mmol/L (ref 22–32)
TCO2: 28 mmol/L (ref 22–32)
TCO2: 25 mmol/L (ref 22–32)
TCO2: 26 mmol/L (ref 22–32)
TCO2: 24 mmol/L (ref 22–32)
TCO2: 23 mmol/L (ref 22–32)
TCO2: 22 mmol/L (ref 22–32)
TCO2: 25 mmol/L (ref 22–32)
pCO2 arterial: 41.7 mmHg (ref 32–48)
pCO2 arterial: 49 mmHg — ABNORMAL HIGH (ref 32–48)
pCO2 arterial: 39.7 mmHg (ref 32–48)
pCO2 arterial: 47.5 mmHg (ref 32–48)
pCO2 arterial: 48.3 mmHg — ABNORMAL HIGH (ref 32–48)
pCO2 arterial: 43 mmHg (ref 32–48)
pCO2 arterial: 35.1 mmHg (ref 32–48)
pCO2 arterial: 43 mmHg (ref 32–48)
pH, Arterial: 7.324 — ABNORMAL LOW (ref 7.35–7.45)
pH, Arterial: 7.334 — ABNORMAL LOW (ref 7.35–7.45)
pH, Arterial: 7.383 (ref 7.35–7.45)
pH, Arterial: 7.317 — ABNORMAL LOW (ref 7.35–7.45)
pH, Arterial: 7.276 — ABNORMAL LOW (ref 7.35–7.45)
pH, Arterial: 7.306 — ABNORMAL LOW (ref 7.35–7.45)
pH, Arterial: 7.375 (ref 7.35–7.45)
pH, Arterial: 7.34 — ABNORMAL LOW (ref 7.35–7.45)
pO2, Arterial: 137 mmHg — ABNORMAL HIGH (ref 83–108)
pO2, Arterial: 438 mmHg — ABNORMAL HIGH (ref 83–108)
pO2, Arterial: 370 mmHg — ABNORMAL HIGH (ref 83–108)
pO2, Arterial: 296 mmHg — ABNORMAL HIGH (ref 83–108)
pO2, Arterial: 112 mmHg — ABNORMAL HIGH (ref 83–108)
pO2, Arterial: 146 mmHg — ABNORMAL HIGH (ref 83–108)
pO2, Arterial: 103 mmHg (ref 83–108)
pO2, Arterial: 55 mmHg — ABNORMAL LOW (ref 83–108)

## 2021-11-24 LAB — HEPARIN LEVEL (UNFRACTIONATED): Heparin Unfractionated: 0.58 IU/mL (ref 0.30–0.70)

## 2021-11-24 LAB — PROTIME-INR
INR: 1.2 (ref 0.8–1.2)
Prothrombin Time: 15 seconds (ref 11.4–15.2)

## 2021-11-24 LAB — POCT I-STAT, CHEM 8
BUN: 15 mg/dL (ref 8–23)
BUN: 16 mg/dL (ref 8–23)
BUN: 14 mg/dL (ref 8–23)
BUN: 15 mg/dL (ref 8–23)
BUN: 13 mg/dL (ref 8–23)
Calcium, Ion: 1.29 mmol/L (ref 1.15–1.40)
Calcium, Ion: 1.26 mmol/L (ref 1.15–1.40)
Calcium, Ion: 1.14 mmol/L — ABNORMAL LOW (ref 1.15–1.40)
Calcium, Ion: 1.11 mmol/L — ABNORMAL LOW (ref 1.15–1.40)
Calcium, Ion: 1.11 mmol/L — ABNORMAL LOW (ref 1.15–1.40)
Chloride: 104 mmol/L (ref 98–111)
Chloride: 104 mmol/L (ref 98–111)
Chloride: 104 mmol/L (ref 98–111)
Chloride: 102 mmol/L (ref 98–111)
Chloride: 103 mmol/L (ref 98–111)
Creatinine, Ser: 1 mg/dL (ref 0.61–1.24)
Creatinine, Ser: 1 mg/dL (ref 0.61–1.24)
Creatinine, Ser: 0.8 mg/dL (ref 0.61–1.24)
Creatinine, Ser: 0.8 mg/dL (ref 0.61–1.24)
Creatinine, Ser: 0.8 mg/dL (ref 0.61–1.24)
Glucose, Bld: 155 mg/dL — ABNORMAL HIGH (ref 70–99)
Glucose, Bld: 144 mg/dL — ABNORMAL HIGH (ref 70–99)
Glucose, Bld: 129 mg/dL — ABNORMAL HIGH (ref 70–99)
Glucose, Bld: 142 mg/dL — ABNORMAL HIGH (ref 70–99)
Glucose, Bld: 176 mg/dL — ABNORMAL HIGH (ref 70–99)
HCT: 36 % — ABNORMAL LOW (ref 39.0–52.0)
HCT: 37 % — ABNORMAL LOW (ref 39.0–52.0)
HCT: 31 % — ABNORMAL LOW (ref 39.0–52.0)
HCT: 30 % — ABNORMAL LOW (ref 39.0–52.0)
HCT: 34 % — ABNORMAL LOW (ref 39.0–52.0)
Hemoglobin: 12.2 g/dL — ABNORMAL LOW (ref 13.0–17.0)
Hemoglobin: 12.6 g/dL — ABNORMAL LOW (ref 13.0–17.0)
Hemoglobin: 10.5 g/dL — ABNORMAL LOW (ref 13.0–17.0)
Hemoglobin: 10.2 g/dL — ABNORMAL LOW (ref 13.0–17.0)
Hemoglobin: 11.6 g/dL — ABNORMAL LOW (ref 13.0–17.0)
Potassium: 4.5 mmol/L (ref 3.5–5.1)
Potassium: 4.4 mmol/L (ref 3.5–5.1)
Potassium: 5.1 mmol/L (ref 3.5–5.1)
Potassium: 5 mmol/L (ref 3.5–5.1)
Potassium: 4.5 mmol/L (ref 3.5–5.1)
Sodium: 136 mmol/L (ref 135–145)
Sodium: 136 mmol/L (ref 135–145)
Sodium: 136 mmol/L (ref 135–145)
Sodium: 138 mmol/L (ref 135–145)
Sodium: 137 mmol/L (ref 135–145)
TCO2: 26 mmol/L (ref 22–32)
TCO2: 23 mmol/L (ref 22–32)
TCO2: 24 mmol/L (ref 22–32)
TCO2: 24 mmol/L (ref 22–32)
TCO2: 24 mmol/L (ref 22–32)

## 2021-11-24 LAB — ECHO INTRAOPERATIVE TEE
Height: 67 in
Weight: 3380.8 oz

## 2021-11-24 LAB — APTT: aPTT: 33 seconds (ref 24–36)

## 2021-11-24 LAB — HEMOGLOBIN AND HEMATOCRIT, BLOOD
HCT: 28.1 % — ABNORMAL LOW (ref 39.0–52.0)
Hemoglobin: 9.8 g/dL — ABNORMAL LOW (ref 13.0–17.0)

## 2021-11-24 LAB — POCT I-STAT EG7
Acid-base deficit: 1 mmol/L (ref 0.0–2.0)
Bicarbonate: 24.9 mmol/L (ref 20.0–28.0)
Calcium, Ion: 1.11 mmol/L — ABNORMAL LOW (ref 1.15–1.40)
HCT: 28 % — ABNORMAL LOW (ref 39.0–52.0)
Hemoglobin: 9.5 g/dL — ABNORMAL LOW (ref 13.0–17.0)
O2 Saturation: 79 %
Potassium: 4.6 mmol/L (ref 3.5–5.1)
Sodium: 138 mmol/L (ref 135–145)
TCO2: 26 mmol/L (ref 22–32)
pCO2, Ven: 48.7 mmHg (ref 44–60)
pH, Ven: 7.318 (ref 7.25–7.43)
pO2, Ven: 48 mmHg — ABNORMAL HIGH (ref 32–45)

## 2021-11-24 LAB — MAGNESIUM: Magnesium: 3.1 mg/dL — ABNORMAL HIGH (ref 1.7–2.4)

## 2021-11-24 LAB — GLUCOSE, CAPILLARY
Glucose-Capillary: 130 mg/dL — ABNORMAL HIGH (ref 70–99)
Glucose-Capillary: 109 mg/dL — ABNORMAL HIGH (ref 70–99)
Glucose-Capillary: 136 mg/dL — ABNORMAL HIGH (ref 70–99)
Glucose-Capillary: 119 mg/dL — ABNORMAL HIGH (ref 70–99)
Glucose-Capillary: 120 mg/dL — ABNORMAL HIGH (ref 70–99)
Glucose-Capillary: 121 mg/dL — ABNORMAL HIGH (ref 70–99)
Glucose-Capillary: 153 mg/dL — ABNORMAL HIGH (ref 70–99)
Glucose-Capillary: 134 mg/dL — ABNORMAL HIGH (ref 70–99)
Glucose-Capillary: 119 mg/dL — ABNORMAL HIGH (ref 70–99)
Glucose-Capillary: 137 mg/dL — ABNORMAL HIGH (ref 70–99)

## 2021-11-24 LAB — BASIC METABOLIC PANEL
Anion gap: 9 (ref 5–15)
Anion gap: 8 (ref 5–15)
BUN: 17 mg/dL (ref 8–23)
BUN: 14 mg/dL (ref 8–23)
CO2: 21 mmol/L — ABNORMAL LOW (ref 22–32)
CO2: 21 mmol/L — ABNORMAL LOW (ref 22–32)
Calcium: 9.6 mg/dL (ref 8.9–10.3)
Calcium: 8 mg/dL — ABNORMAL LOW (ref 8.9–10.3)
Chloride: 106 mmol/L (ref 98–111)
Chloride: 108 mmol/L (ref 98–111)
Creatinine, Ser: 1.12 mg/dL (ref 0.61–1.24)
Creatinine, Ser: 1.04 mg/dL (ref 0.61–1.24)
GFR, Estimated: >60 mL/min (ref >60)
GFR, Estimated: >60 mL/min (ref >60)
Glucose, Bld: 160 mg/dL — ABNORMAL HIGH (ref 70–99)
Glucose, Bld: 134 mg/dL — ABNORMAL HIGH (ref 70–99)
Potassium: 4.1 mmol/L (ref 3.5–5.1)
Potassium: 4.4 mmol/L (ref 3.5–5.1)
Sodium: 136 mmol/L (ref 135–145)
Sodium: 137 mmol/L (ref 135–145)

## 2021-11-24 LAB — PLATELET COUNT: Platelets: 225 10*3/uL (ref 150–400)

## 2021-11-24 SURGERY — CORONARY ARTERY BYPASS GRAFTING (CABG)
Anesthesia: General | Site: Esophagus

## 2021-11-24 MED ORDER — INSULIN REGULAR(HUMAN) IN NACL 100-0.9 UT/100ML-% IV SOLN
INTRAVENOUS | Status: DC | PRN
  Administered 2021-11-24: 4.8 [IU]/h via INTRAVENOUS

## 2021-11-24 MED ORDER — ROCURONIUM BROMIDE 10 MG/ML (PF) SYRINGE
PREFILLED_SYRINGE | INTRAVENOUS | Status: AC
  Filled 2021-11-24: qty 30

## 2021-11-24 MED ORDER — VANCOMYCIN HCL 1000 MG IV SOLR: INTRAVENOUS | Status: DC | PRN

## 2021-11-24 MED ORDER — PROTAMINE SULFATE 10 MG/ML IV SOLN
INTRAVENOUS | Status: AC
  Filled 2021-11-24: qty 25

## 2021-11-24 MED ORDER — SUCCINYLCHOLINE CHLORIDE 200 MG/10ML IV SOSY
PREFILLED_SYRINGE | INTRAVENOUS | Status: DC | PRN
  Administered 2021-11-24: 120 mg via INTRAVENOUS

## 2021-11-24 MED ORDER — PHENYLEPHRINE 80 MCG/ML (10ML) SYRINGE FOR IV PUSH (FOR BLOOD PRESSURE SUPPORT)
PREFILLED_SYRINGE | INTRAVENOUS | Status: AC
  Filled 2021-11-24: qty 20

## 2021-11-24 MED ORDER — METOCLOPRAMIDE HCL 5 MG/ML IJ SOLN
10.0000 mg | Freq: Four times a day (QID) | INTRAMUSCULAR | Status: AC
  Administered 2021-11-24 – 2021-11-25 (×4): 10 mg via INTRAVENOUS
  Filled 2021-11-24 (×4): qty 2

## 2021-11-24 MED ORDER — PAPAVERINE HCL 30 MG/ML IJ SOLN
INTRAMUSCULAR | Status: AC
  Filled 2021-11-24: qty 2

## 2021-11-24 MED ORDER — FAMOTIDINE IN NACL 20-0.9 MG/50ML-% IV SOLN
20.0000 mg | Freq: Two times a day (BID) | INTRAVENOUS | Status: AC
  Administered 2021-11-24: 20 mg via INTRAVENOUS
  Filled 2021-11-24: qty 50

## 2021-11-24 MED ORDER — PLASMA-LYTE A IV SOLN: INTRAVENOUS | Status: DC | PRN

## 2021-11-24 MED ORDER — PROTAMINE SULFATE 10 MG/ML IV SOLN
INTRAVENOUS | Status: AC
  Filled 2021-11-24: qty 10

## 2021-11-24 MED ORDER — ACETAMINOPHEN 500 MG PO TABS
1000.0000 mg | ORAL_TABLET | Freq: Four times a day (QID) | ORAL | Status: DC
  Administered 2021-11-24 – 2021-11-29 (×18): 1000 mg via ORAL
  Filled 2021-11-24 (×18): qty 2

## 2021-11-24 MED ORDER — CHLORHEXIDINE GLUCONATE CLOTH 2 % EX PADS
6.0000 | MEDICATED_PAD | Freq: Every day | CUTANEOUS | Status: DC
  Administered 2021-11-24 – 2021-11-28 (×5): 6 via TOPICAL

## 2021-11-24 MED ORDER — PROTAMINE SULFATE 10 MG/ML IV SOLN
INTRAVENOUS | Status: DC | PRN
  Administered 2021-11-24: 20 mg via INTRAVENOUS
  Administered 2021-11-24: 320 mg via INTRAVENOUS

## 2021-11-24 MED ORDER — OXYCODONE HCL 5 MG PO TABS
5.0000 mg | ORAL_TABLET | ORAL | Status: DC | PRN
  Administered 2021-11-24 (×3): 5 mg via ORAL
  Administered 2021-11-25 – 2021-11-26 (×6): 10 mg via ORAL
  Administered 2021-11-26: 5 mg via ORAL
  Administered 2021-11-27 – 2021-11-28 (×6): 10 mg via ORAL
  Filled 2021-11-24: qty 1
  Filled 2021-11-24 (×2): qty 2
  Filled 2021-11-24: qty 1
  Filled 2021-11-24 (×4): qty 2
  Filled 2021-11-24: qty 1
  Filled 2021-11-24 (×3): qty 2
  Filled 2021-11-24: qty 1
  Filled 2021-11-24 (×3): qty 2

## 2021-11-24 MED ORDER — TRAMADOL HCL 50 MG PO TABS: 50.0000 mg | ORAL_TABLET | ORAL | Status: DC | PRN

## 2021-11-24 MED ORDER — FENTANYL CITRATE (PF) 250 MCG/5ML IJ SOLN
INTRAMUSCULAR | Status: AC
  Filled 2021-11-24: qty 5

## 2021-11-24 MED ORDER — SODIUM CHLORIDE 0.9% FLUSH: 3.0000 mL | INTRAVENOUS | Status: DC | PRN

## 2021-11-24 MED ORDER — MORPHINE SULFATE (PF) 2 MG/ML IV SOLN
1.0000 mg | INTRAVENOUS | Status: DC | PRN
  Administered 2021-11-24 – 2021-11-25 (×7): 4 mg via INTRAVENOUS
  Filled 2021-11-24 (×7): qty 2
  Filled 2021-11-24: qty 1

## 2021-11-24 MED ORDER — SODIUM CHLORIDE 0.9% FLUSH
10.0000 mL | Freq: Two times a day (BID) | INTRAVENOUS | Status: DC
  Administered 2021-11-24 – 2021-11-26 (×5): 10 mL

## 2021-11-24 MED ORDER — MAGNESIUM SULFATE 4 GM/100ML IV SOLN
4.0000 g | Freq: Once | INTRAVENOUS | Status: AC
  Administered 2021-11-24: 4 g via INTRAVENOUS
  Filled 2021-11-24: qty 100

## 2021-11-24 MED ORDER — PROPOFOL 10 MG/ML IV BOLUS
INTRAVENOUS | Status: AC
  Filled 2021-11-24: qty 20

## 2021-11-24 MED ORDER — HEPARIN SODIUM (PORCINE) 1000 UNIT/ML IJ SOLN
INTRAMUSCULAR | Status: DC | PRN
  Administered 2021-11-24: 34000 [IU] via INTRAVENOUS

## 2021-11-24 MED ORDER — MIDAZOLAM HCL 5 MG/5ML IJ SOLN
INTRAMUSCULAR | Status: DC | PRN
  Administered 2021-11-24 (×2): 1 mg via INTRAVENOUS
  Administered 2021-11-24: 2 mg via INTRAVENOUS
  Administered 2021-11-24: 5 mg via INTRAVENOUS
  Administered 2021-11-24: 1 mg via INTRAVENOUS

## 2021-11-24 MED ORDER — SODIUM CHLORIDE 0.9% FLUSH: 10.0000 mL | INTRAVENOUS | Status: DC | PRN

## 2021-11-24 MED ORDER — GLYCOPYRROLATE 0.2 MG/ML IJ SOLN
INTRAMUSCULAR | Status: DC | PRN
  Administered 2021-11-24: .2 mg via INTRAVENOUS

## 2021-11-24 MED ORDER — PHENYLEPHRINE HCL-NACL 20-0.9 MG/250ML-% IV SOLN
0.0000 ug/min | INTRAVENOUS | Status: DC
  Administered 2021-11-24: 30 ug/min via INTRAVENOUS
  Administered 2021-11-25: 50 ug/min via INTRAVENOUS
  Filled 2021-11-24 (×2): qty 250

## 2021-11-24 MED ORDER — FENTANYL CITRATE (PF) 250 MCG/5ML IJ SOLN
INTRAMUSCULAR | Status: DC | PRN
  Administered 2021-11-24 (×2): 100 ug via INTRAVENOUS
  Administered 2021-11-24 (×2): 50 ug via INTRAVENOUS
  Administered 2021-11-24: 150 ug via INTRAVENOUS
  Administered 2021-11-24: 50 ug via INTRAVENOUS
  Administered 2021-11-24: 250 ug via INTRAVENOUS
  Administered 2021-11-24 (×3): 50 ug via INTRAVENOUS

## 2021-11-24 MED ORDER — PHENYLEPHRINE 80 MCG/ML (10ML) SYRINGE FOR IV PUSH (FOR BLOOD PRESSURE SUPPORT)
PREFILLED_SYRINGE | INTRAVENOUS | Status: DC | PRN
  Administered 2021-11-24 (×4): 80 ug via INTRAVENOUS

## 2021-11-24 MED ORDER — MIDAZOLAM HCL 2 MG/2ML IJ SOLN: 2.0000 mg | INTRAMUSCULAR | Status: DC | PRN

## 2021-11-24 MED ORDER — SODIUM CHLORIDE 0.9 % IV SOLN: 250.0000 mL | INTRAVENOUS | Status: DC

## 2021-11-24 MED ORDER — ASPIRIN 325 MG PO TABS
325.0000 mg | ORAL_TABLET | Freq: Once | ORAL | Status: AC
  Administered 2021-11-24: 325 mg via ORAL
  Filled 2021-11-24: qty 1

## 2021-11-24 MED ORDER — NOREPINEPHRINE 4 MG/250ML-% IV SOLN: 0.0000 ug/min | INTRAVENOUS | Status: DC

## 2021-11-24 MED ORDER — ACETAMINOPHEN 160 MG/5ML PO SOLN: 1000.0000 mg | Freq: Four times a day (QID) | ORAL | Status: DC

## 2021-11-24 MED ORDER — ALBUMIN HUMAN 5 % IV SOLN
250.0000 mL | INTRAVENOUS | Status: DC | PRN
  Administered 2021-11-24 (×3): 12.5 g via INTRAVENOUS
  Filled 2021-11-24: qty 250

## 2021-11-24 MED ORDER — BISACODYL 5 MG PO TBEC
10.0000 mg | DELAYED_RELEASE_TABLET | Freq: Every day | ORAL | Status: DC
  Administered 2021-11-25 – 2021-11-27 (×3): 10 mg via ORAL
  Filled 2021-11-24 (×3): qty 2

## 2021-11-24 MED ORDER — METOPROLOL TARTRATE 12.5 MG HALF TABLET
12.5000 mg | ORAL_TABLET | Freq: Two times a day (BID) | ORAL | Status: DC
  Administered 2021-11-24 – 2021-11-29 (×10): 12.5 mg via ORAL
  Filled 2021-11-24 (×10): qty 1

## 2021-11-24 MED ORDER — DEXTROSE 50 % IV SOLN: 0.0000 mL | INTRAVENOUS | Status: DC | PRN

## 2021-11-24 MED ORDER — 0.9 % SODIUM CHLORIDE (POUR BTL) OPTIME
TOPICAL | Status: DC | PRN
  Administered 2021-11-24: 5000 mL

## 2021-11-24 MED ORDER — SODIUM CHLORIDE (PF) 0.9 % IJ SOLN
INTRAMUSCULAR | Status: AC
  Filled 2021-11-24: qty 10

## 2021-11-24 MED ORDER — CHLORHEXIDINE GLUCONATE 0.12 % MT SOLN
15.0000 mL | OROMUCOSAL | Status: AC
  Administered 2021-11-24: 15 mL via OROMUCOSAL
  Filled 2021-11-24: qty 15

## 2021-11-24 MED ORDER — LACTATED RINGERS IV SOLN: 500.0000 mL | Freq: Once | INTRAVENOUS | Status: DC | PRN

## 2021-11-24 MED ORDER — ONDANSETRON HCL 4 MG/2ML IJ SOLN: 4.0000 mg | Freq: Four times a day (QID) | INTRAMUSCULAR | Status: DC | PRN

## 2021-11-24 MED ORDER — ASPIRIN 325 MG PO TABS: 325.0000 mg | ORAL_TABLET | Freq: Once | ORAL | Status: DC

## 2021-11-24 MED ORDER — SUCCINYLCHOLINE CHLORIDE 200 MG/10ML IV SOSY
PREFILLED_SYRINGE | INTRAVENOUS | Status: AC
  Filled 2021-11-24: qty 10

## 2021-11-24 MED ORDER — PAPAVERINE HCL 30 MG/ML IJ SOLN
INTRAMUSCULAR | Status: DC | PRN
  Administered 2021-11-24: 60 mg

## 2021-11-24 MED ORDER — ASPIRIN 325 MG PO TBEC
325.0000 mg | DELAYED_RELEASE_TABLET | Freq: Every day | ORAL | Status: DC
  Administered 2021-11-25 – 2021-11-29 (×5): 325 mg via ORAL
  Filled 2021-11-24 (×5): qty 1

## 2021-11-24 MED ORDER — SODIUM CHLORIDE 0.9 % IV SOLN: INTRAVENOUS | Status: DC

## 2021-11-24 MED ORDER — BISACODYL 10 MG RE SUPP: 10.0000 mg | Freq: Every day | RECTAL | Status: DC

## 2021-11-24 MED ORDER — LACTATED RINGERS IV SOLN: INTRAVENOUS | Status: DC | PRN

## 2021-11-24 MED ORDER — METOPROLOL TARTRATE 5 MG/5ML IV SOLN: 2.5000 mg | INTRAVENOUS | Status: DC | PRN

## 2021-11-24 MED ORDER — VASOPRESSIN 20 UNITS/100 ML INFUSION FOR SHOCK: 0.0000 [IU]/min | INTRAVENOUS | Status: DC

## 2021-11-24 MED ORDER — POTASSIUM CHLORIDE 10 MEQ/50ML IV SOLN: 10.0000 meq | INTRAVENOUS | Status: AC

## 2021-11-24 MED ORDER — DOCUSATE SODIUM 100 MG PO CAPS
200.0000 mg | ORAL_CAPSULE | Freq: Every day | ORAL | Status: DC
  Administered 2021-11-25 – 2021-11-28 (×4): 200 mg via ORAL
  Filled 2021-11-24 (×4): qty 2

## 2021-11-24 MED ORDER — ALBUMIN HUMAN 5 % IV SOLN: INTRAVENOUS | Status: DC | PRN

## 2021-11-24 MED ORDER — CEFAZOLIN SODIUM-DEXTROSE 2-4 GM/100ML-% IV SOLN
2.0000 g | Freq: Three times a day (TID) | INTRAVENOUS | Status: AC
  Administered 2021-11-24 – 2021-11-26 (×6): 2 g via INTRAVENOUS
  Filled 2021-11-24 (×6): qty 100

## 2021-11-24 MED ORDER — HEPARIN SODIUM (PORCINE) 1000 UNIT/ML IJ SOLN
INTRAMUSCULAR | Status: AC
  Filled 2021-11-24: qty 1

## 2021-11-24 MED ORDER — DEXMEDETOMIDINE HCL IN NACL 400 MCG/100ML IV SOLN
0.0000 ug/kg/h | INTRAVENOUS | Status: DC
  Administered 2021-11-24: 0.2 ug/kg/h via INTRAVENOUS
  Filled 2021-11-24: qty 100

## 2021-11-24 MED ORDER — SODIUM CHLORIDE 0.9% FLUSH
3.0000 mL | Freq: Two times a day (BID) | INTRAVENOUS | Status: DC
  Administered 2021-11-25 – 2021-11-26 (×4): 3 mL via INTRAVENOUS

## 2021-11-24 MED ORDER — TRANEXAMIC ACID 1000 MG/10ML IV SOLN
INTRAVENOUS | Status: DC | PRN
  Administered 2021-11-24: 1.5 mg/kg/h via INTRAVENOUS

## 2021-11-24 MED ORDER — ASPIRIN 81 MG PO CHEW: 324.0000 mg | CHEWABLE_TABLET | Freq: Every day | ORAL | Status: DC

## 2021-11-24 MED ORDER — METOPROLOL TARTRATE 25 MG/10 ML ORAL SUSPENSION
12.5000 mg | Freq: Two times a day (BID) | ORAL | Status: DC
  Filled 2021-11-24 (×2): qty 5

## 2021-11-24 MED ORDER — PHENYLEPHRINE HCL-NACL 20-0.9 MG/250ML-% IV SOLN
INTRAVENOUS | Status: DC | PRN
  Administered 2021-11-24: 25 ug/min via INTRAVENOUS

## 2021-11-24 MED ORDER — MIDAZOLAM HCL (PF) 10 MG/2ML IJ SOLN
INTRAMUSCULAR | Status: AC
  Filled 2021-11-24: qty 2

## 2021-11-24 MED ORDER — LACTATED RINGERS IV SOLN: INTRAVENOUS | Status: DC

## 2021-11-24 MED ORDER — INSULIN REGULAR(HUMAN) IN NACL 100-0.9 UT/100ML-% IV SOLN: INTRAVENOUS | Status: DC

## 2021-11-24 MED ORDER — ACETAMINOPHEN 650 MG RE SUPP
650.0000 mg | Freq: Once | RECTAL | Status: AC
  Administered 2021-11-24: 650 mg via RECTAL

## 2021-11-24 MED ORDER — HEPARIN SODIUM (PORCINE) 1000 UNIT/ML IJ SOLN
INTRAMUSCULAR | Status: AC
  Filled 2021-11-24: qty 10

## 2021-11-24 MED ORDER — PANTOPRAZOLE SODIUM 40 MG PO TBEC
40.0000 mg | DELAYED_RELEASE_TABLET | Freq: Every day | ORAL | Status: DC
  Administered 2021-11-26 – 2021-11-29 (×4): 40 mg via ORAL
  Filled 2021-11-24 (×4): qty 1

## 2021-11-24 MED ORDER — FLUTICASONE PROPIONATE 50 MCG/ACT NA SUSP: 2.0000 | Freq: Every day | NASAL | Status: DC | PRN

## 2021-11-24 MED ORDER — ARTIFICIAL TEARS OPHTHALMIC OINT
TOPICAL_OINTMENT | OPHTHALMIC | Status: DC | PRN
  Administered 2021-11-24: 1 via OPHTHALMIC

## 2021-11-24 MED ORDER — PROPOFOL 10 MG/ML IV BOLUS
INTRAVENOUS | Status: DC | PRN
  Administered 2021-11-24: 140 mg via INTRAVENOUS

## 2021-11-24 MED ORDER — VANCOMYCIN HCL IN DEXTROSE 1-5 GM/200ML-% IV SOLN
1000.0000 mg | Freq: Once | INTRAVENOUS | Status: AC
  Administered 2021-11-24: 1000 mg via INTRAVENOUS
  Filled 2021-11-24: qty 200

## 2021-11-24 MED ORDER — DEXMEDETOMIDINE HCL IN NACL 400 MCG/100ML IV SOLN
INTRAVENOUS | Status: DC | PRN
  Administered 2021-11-24: .7 ug/kg/h via INTRAVENOUS

## 2021-11-24 MED ORDER — ACETAMINOPHEN 160 MG/5ML PO SOLN: 650.0000 mg | Freq: Once | ORAL | Status: AC

## 2021-11-24 MED ORDER — ROCURONIUM BROMIDE 10 MG/ML (PF) SYRINGE
PREFILLED_SYRINGE | INTRAVENOUS | Status: DC | PRN
  Administered 2021-11-24 (×3): 50 mg via INTRAVENOUS
  Administered 2021-11-24: 40 mg via INTRAVENOUS
  Administered 2021-11-24: 50 mg via INTRAVENOUS

## 2021-11-24 MED ORDER — CLEVIDIPINE BUTYRATE 0.5 MG/ML IV EMUL
0.0000 mg/h | INTRAVENOUS | Status: DC
  Filled 2021-11-24: qty 50

## 2021-11-24 MED ORDER — SODIUM CHLORIDE 0.45 % IV SOLN: INTRAVENOUS | Status: DC | PRN

## 2021-11-24 MED FILL — Magnesium Sulfate Inj 50%: INTRAMUSCULAR | Qty: 10 | Status: AC

## 2021-11-24 MED FILL — Potassium Chloride Inj 2 mEq/ML: INTRAVENOUS | Qty: 40 | Status: AC

## 2021-11-24 MED FILL — Heparin Sodium (Porcine) Inj 1000 Unit/ML: Qty: 1000 | Status: AC

## 2021-11-24 SURGICAL SUPPLY — 92 items
ADAPTER CARDIO PERF ANTE/RETRO (ADAPTER) ×2 IMPLANT
BAG DECANTER FOR FLEXI CONT (MISCELLANEOUS) ×2 IMPLANT
BLADE CLIPPER SURG (BLADE) ×2 IMPLANT
BLADE MICRO SHARP 3 15 DEG (BLADE) ×2 IMPLANT
BLADE STERNUM SYSTEM 6 (BLADE) ×2 IMPLANT
BNDG ELASTIC 4X5.8 VLCR STR LF (GAUZE/BANDAGES/DRESSINGS) ×2 IMPLANT
BNDG ELASTIC 6X5.8 VLCR STR LF (GAUZE/BANDAGES/DRESSINGS) ×2 IMPLANT
BNDG GAUZE DERMACEA FLUFF 4 (GAUZE/BANDAGES/DRESSINGS) ×2 IMPLANT
BOOT SUTURE AID YELLOW STND (SUTURE) ×2 IMPLANT
CANISTER SUCT 3000ML PPV (MISCELLANEOUS) ×2 IMPLANT
CANNULA AORTIC ROOT 9FR (CANNULA) IMPLANT
CANNULA NON VENT 22FR 12 (CANNULA) ×2 IMPLANT
CANNULA VESSEL 3MM BLUNT TIP (CANNULA) IMPLANT
CATH RETROPLEGIA CORONARY 14FR (CATHETERS) ×2 IMPLANT
CATH ROBINSON RED A/P 18FR (CATHETERS) ×6 IMPLANT
CATH THOR STR 32F SOFT 20 RADI (CATHETERS) ×4 IMPLANT
CATH THORACIC 28FR RT ANG (CATHETERS) ×2 IMPLANT
CLIP TI LARGE 6 (CLIP) ×2 IMPLANT
CLIP VESOCCLUDE MED 24/CT (CLIP) IMPLANT
CLIP VESOCCLUDE SM WIDE 24/CT (CLIP) IMPLANT
CNTNR URN SCR LID CUP LEK RST (MISCELLANEOUS) IMPLANT
CONT SPEC 4OZ STRL OR WHT (MISCELLANEOUS) ×10
CONTAINER PROTECT SURGISLUSH (MISCELLANEOUS) ×6 IMPLANT
DRAPE CARDIOVASCULAR INCISE (DRAPES) ×2
DRAPE INCISE IOBAN 66X45 STRL (DRAPES) ×2 IMPLANT
DRAPE SRG 135X102X78XABS (DRAPES) ×2 IMPLANT
DRAPE WARM FLUID 44X44 (DRAPES) ×2 IMPLANT
DRESSING AQUACEL AG ADV 3.5X12 (MISCELLANEOUS) ×2 IMPLANT
DRSG AQUACEL AG ADV 3.5X10 (GAUZE/BANDAGES/DRESSINGS) IMPLANT
DRSG AQUACEL AG ADV 3.5X12 (MISCELLANEOUS) ×2
ELECT BLADE 4.0 EZ CLEAN MEGAD (MISCELLANEOUS) ×2
ELECT CAUTERY BLADE 6.4 (BLADE) ×2 IMPLANT
ELECT REM PT RETURN 9FT ADLT (ELECTROSURGICAL) ×4
ELECTRODE BLDE 4.0 EZ CLN MEGD (MISCELLANEOUS) ×2 IMPLANT
ELECTRODE REM PT RTRN 9FT ADLT (ELECTROSURGICAL) ×4 IMPLANT
FELT TEFLON 1X6 (MISCELLANEOUS) ×2 IMPLANT
GAUZE 4X4 16PLY ~~LOC~~+RFID DBL (SPONGE) IMPLANT
GAUZE SPONGE 4X4 12PLY STRL (GAUZE/BANDAGES/DRESSINGS) ×4 IMPLANT
GLOVE BIO SURGEON STRL SZ 6 (GLOVE) IMPLANT
GLOVE BIO SURGEON STRL SZ7.5 (GLOVE) IMPLANT
GLOVE BIOGEL PI IND STRL 6 (GLOVE) IMPLANT
GLOVE BIOGEL PI IND STRL 6.5 (GLOVE) IMPLANT
GLOVE SS BIOGEL STRL SZ 6 (GLOVE) IMPLANT
GLOVE SURG SS PI 7.5 STRL IVOR (GLOVE) IMPLANT
GLOVE SURG SS PI 8.0 STRL IVOR (GLOVE) IMPLANT
GOWN STRL REUS W/ TWL LRG LVL3 (GOWN DISPOSABLE) ×12 IMPLANT
GOWN STRL REUS W/TWL LRG LVL3 (GOWN DISPOSABLE) ×12
INSERT FOGARTY 61MM (MISCELLANEOUS) IMPLANT
INSERT SUTURE HOLDER (MISCELLANEOUS) IMPLANT
IV ADAPTER SYR DOUBLE MALE LL (MISCELLANEOUS) IMPLANT
KIT BASIN OR (CUSTOM PROCEDURE TRAY) ×2 IMPLANT
KIT CATH CPB BARTLE (MISCELLANEOUS) ×2 IMPLANT
KIT TURNOVER KIT B (KITS) ×2 IMPLANT
KIT VASOVIEW HEMOPRO 2 VH 4000 (KITS) ×2 IMPLANT
KNIFE MICRO-UNI 3.5 30 DEG (BLADE) IMPLANT
NS IRRIG 1000ML POUR BTL (IV SOLUTION) ×10 IMPLANT
PACK E OPEN HEART (SUTURE) ×2 IMPLANT
PACK OPEN HEART (CUSTOM PROCEDURE TRAY) ×2 IMPLANT
PAD ARMBOARD 7.5X6 YLW CONV (MISCELLANEOUS) ×4 IMPLANT
PAD ELECT DEFIB RADIOL ZOLL (MISCELLANEOUS) ×2 IMPLANT
PENCIL BUTTON HOLSTER BLD 10FT (ELECTRODE) ×2 IMPLANT
POSITIONER HEAD DONUT 9IN (MISCELLANEOUS) ×2 IMPLANT
PUNCH AORTIC ROTATE 4.0MM (MISCELLANEOUS) IMPLANT
PUNCH AORTIC ROTATE 4.5MM 8IN (MISCELLANEOUS) IMPLANT
SET MPS 3-ND DEL (MISCELLANEOUS) IMPLANT
SET Y-VENT ADPTR 7.5 MALE LUER (ADAPTER) IMPLANT
SPONGE T-LAP 18X18 ~~LOC~~+RFID (SPONGE) IMPLANT
STOPCOCK 4 WAY LG BORE MALE ST (IV SETS) IMPLANT
SUPPORT HEART JANKE-BARRON (MISCELLANEOUS) ×2 IMPLANT
SUT MNCRL AB 4-0 PS2 18 (SUTURE) ×4 IMPLANT
SUT PROLENE 4 0 RB 1 (SUTURE) ×6
SUT PROLENE 4 0 SH DA (SUTURE) ×2 IMPLANT
SUT PROLENE 4-0 RB1 .5 CRCL 36 (SUTURE) ×2 IMPLANT
SUT PROLENE 7 0 BV1 MDA (SUTURE) ×2 IMPLANT
SUT PROLENE 8 0 BV175 6 (SUTURE) IMPLANT
SUT STEEL SZ 6 DBL 3X14 BALL (SUTURE) ×4 IMPLANT
SUT TEM PAC WIRE 2 0 SH (SUTURE) IMPLANT
SUT VIC AB 0 CTX 27 (SUTURE) IMPLANT
SUT VIC AB 0 CTX 36 (SUTURE) ×4
SUT VIC AB 0 CTX36XBRD ANTBCTR (SUTURE) ×4 IMPLANT
SUT VIC AB 2-0 CT1 27 (SUTURE) ×6
SUT VIC AB 2-0 CT1 TAPERPNT 27 (SUTURE) ×4 IMPLANT
SYR BULB IRRIG 60ML STRL (SYRINGE) IMPLANT
SYSTEM SAHARA CHEST DRAIN ATS (WOUND CARE) ×2 IMPLANT
TOWEL GREEN STERILE (TOWEL DISPOSABLE) ×2 IMPLANT
TOWEL GREEN STERILE FF (TOWEL DISPOSABLE) ×2 IMPLANT
TRAY FOLEY SLVR 16FR TEMP STAT (SET/KITS/TRAYS/PACK) ×2 IMPLANT
TUBE SUCTION CARDIAC 10FR (CANNULA) IMPLANT
TUBING ART PRESS 48 MALE/FEM (TUBING) IMPLANT
TUBING LAP HI FLOW INSUFFLATIO (TUBING) ×2 IMPLANT
UNDERPAD 30X36 HEAVY ABSORB (UNDERPADS AND DIAPERS) ×2 IMPLANT
WATER STERILE IRR 1000ML POUR (IV SOLUTION) ×4 IMPLANT

## 2021-11-24 NOTE — Anesthesia Procedure Notes (Signed)
Central Venous Catheter Insertion Performed by: Belinda Block, MD, anesthesiologist Start/End9/29/2023 7:05 AM, 11/24/2021 7:20 AM Patient location: Pre-op. Preanesthetic checklist: patient identified, IV checked, site marked, risks and benefits discussed, surgical consent, monitors and equipment checked, pre-op evaluation, timeout performed and anesthesia consent Position: Trendelenburg Lidocaine 1% used for infiltration and patient sedated Hand hygiene performed  and maximum sterile barriers used  PA cath was placed.Swan type:thermodilution Procedure performed without using ultrasound guided technique. Attempts: 1 Patient tolerated the procedure well with no immediate complications.

## 2021-11-24 NOTE — Progress Notes (Signed)
  Echocardiogram Echocardiogram Transesophageal has been performed.  Samuel [name] 11/24/2021, 8:59 AM

## 2021-11-24 NOTE — Procedures (Signed)
Extubation Procedure Note  Patient Details:   Name: Samuel [name] DOB: 03-31-1950 MRN: [id number]   Airway Documentation:    Vent end date: 11/24/21 Vent end time: 1611   Evaluation  O2 sats: stable throughout Complications: No apparent complications Patient did tolerate procedure well. Bilateral Breath Sounds: Clear   Yes  Pt extubated to 3L Samuel [name],  pt tolerating well at this time. Cuff leak present , no stridor noted, RN at bedside, RT will monitor.   Samuel [name] 11/24/2021, 4:12 PM

## 2021-11-24 NOTE — Anesthesia Procedure Notes (Signed)
Central Venous Catheter Insertion Performed by: Belinda Block, MD, anesthesiologist Start/End9/29/2023 7:05 AM, 11/24/2021 7:20 AM Patient location: Pre-op. Preanesthetic checklist: patient identified, IV checked, site marked, risks and benefits discussed, surgical consent, monitors and equipment checked, pre-op evaluation, timeout performed and anesthesia consent Lidocaine 1% used for infiltration and patient sedated Hand hygiene performed  and maximum sterile barriers used  Catheter size: 8.5 Fr Sheath introducer Procedure performed using ultrasound guided technique. Ultrasound Notes:anatomy identified, needle tip was noted to be adjacent to the nerve/plexus identified, no ultrasound evidence of intravascular and/or intraneural injection and image(s) printed for medical record Attempts: 1 Following insertion, line sutured and dressing applied. Post procedure assessment: blood return through all ports, free fluid flow and no air  Patient tolerated the procedure well with no immediate complications.

## 2021-11-24 NOTE — Op Note (Signed)
[address]       [place] [address]             [phone]                                          11/24/2021 Patient:  Samuel [name] Pre-Op Dx: CAD with LM stenosis   Post-op Dx:  same Procedure: CABG X 3 with the LIMA to the LAD, RSVG from the aorta to the PDA and from the aorta to the OM   Endoscopic greater saphenous vein harvest on the right   Surgeon and Role:      Coralie Common, MD- Primary    * Wynelle Beckmann , PA-C - assisting      Enid Cutter, PA-C An experienced assistant was required given the complexity of this surgery and the standard of surgical care. The assistant was needed for exposure, dissection, suctioning, retraction of delicate tissues and sutures, instrument exchange and for overall help during this procedure.    Anesthesia  general EBL:  174m Blood Administration: none Xclamp Time:  55 min Pump Time:  953m  Drains:Chest tubes x 2 with one anterior and one posterior mediastinum and one left pleural space Wires: 7 Counts: correct   Indications: Pt is [id number] wm with admission for CP and found to have an NSTEMI Was found on work up to have CAD and LM stenosis  Findings: The LV was normal in function and size. The LAD was a 1.5 mm vessel with minimal disease, the PDA and OM were thin walled and 1.5 mm vessels. The LIMA and SVG were excellent and at the conclusion of case pt had normal LV function  Operative Technique: All invasive lines were placed in pre-op holding.  After the risks, benefits and alternatives were thoroughly discussed, the patient was brought to the operative theatre.  Anesthesia was induced, and the patient was prepped and draped in normal sterile fashion.  An appropriate surgical pause was performed, and pre-operative antibiotics were dosed accordingly.  We began with simultaneous incisions along the right leg for harvesting of the greater saphenous vein and the chest for the sternotomy.  In  regards to the sternotomy, this was carried down with bovie cautery, and the sternum was divided with a reciprocating saw.  Meticulous hemostasis was obtained.  The left internal thoracic artery was exposed and harvested in in pedicled fashion.  The patient was systemically heparinized, and the artery was divided distally, and placed in a papaverine sponge.    The sternal elevator was removed, and a retractor was placed.  The pericardium was divided in the midline and fashioned into a cradle with pericardial stitches.   After we confirmed an appropriate ACT, the ascending aorta was cannulated in standard fashion.  The right atrial appendage was used for venous cannulation site. Antegrade and retrograde cardioplegia catheters were placed in the aorta and coronary sinus respectfully.  Cardiopulmonary bypass was initiated,. The cross clamp was applied, and a dose of anterograde and retrograde cardioplegia was given with good arrest of the heart and for a total of 56m2mafter arrest. An additional dose was given after 20 min and a hot shot was given just prior to cross clamp removal. We moved to the posterior wall of the heart, and found a good target on the PDA.  An arteriotomy was  made, and the vein graft was anastomosed to it in an end to side fashion.  Next we exposed the lateral wall, and found a good target on the OM.  An end to side anastomosis with the vein graft was then created.  Finally, we exposed a good target on the mid LAD, and fashioned an end to side anastomosis between it and the LITA.  We began to re-warm.  The heart was de-aired, and the cross clamp was removed.  Meticulous hemostasis was obtained.    A partial occludding clamp was then placed on the ascending aorta, and we created an end to side anastomosis between it and the proximal vein graft x 2.  Rings were placed on the proximal anastomosis.  Hemostasis was obtained, and we placed atrial and ventricular wires and brought out through  inferior incisions.  we separated from cardiopulmonary bypass without event.  The heparin was reversed with protamine.  Chest tubes were placed, and the sternum was re-approximated with sternal wires.  The soft tissue and skin were re-approximated wth absorbable suture.    The patient tolerated the procedure without any immediate complications, and was transferred to the ICU in guarded condition.  Coralie Common MD

## 2021-11-24 NOTE — Anesthesia Procedure Notes (Signed)
Procedure Name: Intubation Date/Time: 11/24/2021 7:56 AM  Performed by: Bryson Corona, CRNAPre-anesthesia Checklist: Patient identified, Emergency Drugs available, Suction available and Patient being monitored Patient Re-evaluated:Patient Re-evaluated prior to induction Oxygen Delivery Method: Circle System Utilized Preoxygenation: Pre-oxygenation with 100% oxygen Induction Type: IV induction Ventilation: Mask ventilation without difficulty Laryngoscope Size: Mac and 4 Grade View: Grade I Tube type: Oral Tube size: 8.0 mm Number of attempts: 1 Airway Equipment and Method: Stylet and Oral airway Placement Confirmation: ETT inserted through vocal cords under direct vision, positive ETCO2 and breath sounds checked- equal and bilateral Secured at: 23 cm Tube secured with: Tape Dental Injury: Teeth and Oropharynx as per pre-operative assessment

## 2021-11-24 NOTE — Brief Op Note (Signed)
11/22/2021 - 11/24/2021  12:27 PM  PATIENT:  Samuel [name]  71 y.o. male  PRE-OPERATIVE DIAGNOSIS:  CORONARY ARTERY DISEASE  POST-OPERATIVE DIAGNOSIS:  CORONARY ARTERY DISEASE  PROCEDURE:  CORONARY ARTERY BYPASS GRAFTING (CABG) X 3 BYPASSES, USING OPEN LEFT INTERNAL MAMMARY ARTERY AND ENDOSCOPIC RIGHT GREATER SAPHENOUS VEIN HARVEST -LIMA to LAD -SVG to PDA -SVG to OM Vein harvest time: 30mn Vein prep time: 143m TRANSESOPHAGEAL ECHOCARDIOGRAM (TEE)   SURGEON:  Surgeon(s) and Role:  WeCoralie CommonMD - Primary  PHYSICIAN ASSISTANT: BaWynelle BeckmannA-C, MyEnid CutterA-C  ASSISTANTS: MaDespina AriasNFA   ANESTHESIA:   general  EBL:  376 mL   BLOOD ADMINISTERED:none  DRAINS:  Mediastinal and pleural chest tubes    LOCAL MEDICATIONS USED:  NONE  SPECIMEN:  No Specimen  DISPOSITION OF SPECIMEN:  N/A  COUNTS CORRECT:  YES  DICTATION: .Dragon Dictation  PLAN OF CARE: Admit to inpatient   PATIENT DISPOSITION:  ICU - intubated and hemodynamically stable.   Delay start of Pharmacological VTE agent (>24hrs) due to surgical blood loss or risk of bleeding: yes

## 2021-11-24 NOTE — Discharge Summary (Signed)
[address]       [place] [address]             [phone]    Physician Discharge Summary  Patient ID: Samuel [name] MRN: [id number] DOB/AGE: Sep 22, 1950 71 y.o.  Admit date: 11/22/2021 Discharge date: 11/29/2021  Admission Diagnoses:  Patient Active Problem List   Diagnosis Date Noted   Carotid artery stenosis, asymptomatic, bilateral 11/23/2021   Acute myocardial infarction (non-STEMI) involving left main coronary artery ([place]) 11/22/2021   Coronary artery disease involving native coronary artery of native heart with unstable angina pectoris ([place]) 11/22/2021   AKI (acute kidney injury) ([place]) 11/21/2021   Uncontrolled type 2 diabetes mellitus with hyperglycemia, without long-term current use of insulin ([place]) 11/21/2021   Dyslipidemia 11/21/2021   Essential hypertension 11/21/2021   GERD without esophagitis 11/21/2021   NSTEMI (non-ST elevated myocardial infarction) ([place]) 11/20/2021   Status post total left knee replacement 05/10/2019   Primary osteoarthritis of right knee 11/25/2018   Diabetes mellitus with stage 2 chronic kidney disease ([place]) 06/06/2013   Dyslipidemia associated with type 2 diabetes mellitus ([place]) 06/06/2013   Hypertension 06/06/2013   OSA on CPAP 06/06/2013   Discharge Diagnoses:  Patient Active Problem List   Diagnosis Date Noted   S/P CABG x 3 11/24/2021   Carotid artery stenosis, asymptomatic, bilateral 11/23/2021   Acute myocardial infarction (non-STEMI) involving left main coronary artery ([place]) 11/22/2021   Coronary artery disease involving native coronary artery of native heart with unstable angina pectoris ([place]) 11/22/2021   AKI (acute kidney injury) ([place]) 11/21/2021   Uncontrolled type 2 diabetes mellitus with hyperglycemia, without long-term current use of insulin ([place]) 11/21/2021   Dyslipidemia 11/21/2021   Essential hypertension 11/21/2021   GERD without esophagitis 11/21/2021   NSTEMI (non-ST elevated myocardial  infarction) ([place]) 11/20/2021   Status post total left knee replacement 05/10/2019   Primary osteoarthritis of right knee 11/25/2018   Diabetes mellitus with stage 2 chronic kidney disease ([place]) 06/06/2013   Dyslipidemia associated with type 2 diabetes mellitus ([place]) 06/06/2013   Hypertension 06/06/2013   OSA on CPAP 06/06/2013   Discharged Condition: good  History of Present Illness:     This is a 71 year old male with a past medical history of DM (type II), hypertension, hyperlipidemia, OSA (on CPAP), obesity, stroke, GERD who presented to [facility] ED on 11/20/2021 with complaints of chest pain with exertion, shortness of breath, and fatigue. Per patient, he had  short of breath with exertion about 3 weeks while golfing with his son. He was doing yard work about 2 weeks ago and had chest pain and shortness of breath. He also noticed he has some pain in his left shoulder. He denies LE edema, nausea, vomiting, or palpitations. EKG showed sinus rhythm, RBBB, and inferior T wave inversion. Initial Troponin I (high sensitivity) was 183 and max'd at 198. He ruled in for a NSTEMI.  D dimer was elevated at 1.15 so CTA chest was done and there was no PE. There is CAD and aortic atherosclerosis. Cardiac catheterization done 11/21/2021 showed LVEF 55-65%, ostial to mid LM with 80% stenosis, ostial to proximal Circumflex 90% stenosis, and ostial to proximal RCA with a 90% stenosis. Echo done 11/21/2021 showed LVEF 70-75%, LV has no regional wall motion abnormalities, and no significant valvular disease. Dr. Saralyn Pilar asked for transfer to [facility] for further evaluation and treatment. A cardiothoracic consultation was requested for consideration of coronary artery bypass grafting  surgery. At the time of my evaluation, he denied chest pain or shortness of breath. Wife at bedside and patient somewhat emotional.  Dr. Lavonna Monarch reviewed the patient's chart, labs and diagnostic studies and determined that  surgical revascularization would provide Samuel [name] the best long term treatment. Dr. Lavonna Monarch discussed the patient's treatment options as well as the risks and benefits of surgery. After careful consideration Samuel [name] agreed to move forward with coronary artery bypass grafting surgery.  Hospital Course: Samuel [name] was an inpatient at [facility], he was brought to the operating room on 11/24/2021. He underwent a CABG x 3 utilizing LIMA to LAD, RSVG to PDA and RSVG to OM. He tolerated the procedure well and was transferred to the SICU in stable condition.   Postoperative hospital course:  He is remained neurologically intact.  He is maintaining sinus rhythm.  He was extubated from the ventilator using standard post cardiac surgical protocols without difficulty.  He is refusing CPAP due to some issues of how the mask fits.  He has some postoperative expected volume overload and is being diuresed with IV Lasix.  Diabetes has been managed with usual protocols.  He has been resumed on his metformin.  He does have an expected acute blood loss anemia which is being monitored closely.  The patients blood sugars improved with resumption of home medications.  He was hypokalemic and supplemented accordingly.  He was maintaining NSR and felt stable for transfer to the progressive care unit.  The patients pacing wires were removed without difficulty.  He developed mild hyponatremia due to diuresis.  He has been weaned off oxygen without difficulty.  His surgical incisions are healing without evidence of infection.  He was felt medically stable for discharge home today.  Consults: None  Significant Diagnostic Studies: angiography:   Left Heart Cath   Ost LM to Mid LM lesion is 80% stenosed.   Ost Cx to Prox Cx lesion is 90% stenosed.   Prox Cx to Mid Cx lesion is 80% stenosed.   1st Diag lesion is 60% stenosed.   Ost RCA to Prox RCA lesion is 90% stenosed.   Prox RCA lesion is 40% stenosed.   Mid RCA to  Dist RCA lesion is 80% stenosed.   Dist RCA lesion is 60% stenosed.   The left ventricular systolic function is normal.   LV end diastolic pressure is moderately elevated.   The left ventricular ejection fraction is 55-65% by visual estimate.  ECHOCARDIOGRAM REPORT      Patient Name:   JOZIAH DOLLINS Date of Exam: 11/21/2021  Medical Rec #:  [id number]    Height:       67.0 in  Accession #:    [id number]   Weight:       213.0 lb  Date of Birth:  1950/11/01    BSA:          2.077 m  Patient Age:    46 years     BP:           128/78 mmHg  Patient Gender: M            HR:           70 bpm.  Exam Location:  [facility]   Procedure: 2D Echo, Cardiac Doppler and Color Doppler   Indications:     I21.4 NSTEMI     History:         Patient has no prior history of Echocardiogram  examinations.  Stroke; Risk Factors:Diabetes, Dyslipidemia, Sleep Apnea  and                   Hypertension.     Sonographer:     Cresenciano Lick RDCS  Referring Phys:  [id number] [place] TANG  Diagnosing Phys: Yolonda Kida MD   IMPRESSIONS     1. Normal LVF.   2. Left ventricular ejection fraction, by estimation, is 70 to 75%. Left  ventricular ejection fraction by PLAX is 75 %. The left ventricle has  hyperdynamic function. The left ventricle has no regional wall motion  abnormalities. There is mild concentric  left ventricular hypertrophy. Left ventricular diastolic parameters were  normal.   3. Right ventricular systolic function is normal. The right ventricular  size is normal. There is normal pulmonary artery systolic pressure.   4. The mitral valve is normal in structure. No evidence of mitral valve  regurgitation.   5. The aortic valve was not assessed. Aortic valve regurgitation is not  visualized.   FINDINGS   Left Ventricle: Left ventricular ejection fraction, by estimation, is 70  to 75%. Left ventricular ejection fraction by PLAX is 75 %. The left  ventricle has  hyperdynamic function. The left ventricle has no regional  wall motion abnormalities. The left  ventricular internal cavity size was small. There is mild concentric left  ventricular hypertrophy. Left ventricular diastolic parameters were  normal.   Right Ventricle: The right ventricular size is normal. No increase in  right ventricular wall thickness. Right ventricular systolic function is  normal. There is normal pulmonary artery systolic pressure.   Left Atrium: Left atrial size was normal in size.   Right Atrium: Right atrial size was normal in size.   Pericardium: There is no evidence of pericardial effusion.   Mitral Valve: The mitral valve is normal in structure. No evidence of  mitral valve regurgitation.   Tricuspid Valve: The tricuspid valve is normal in structure. Tricuspid  valve regurgitation is trivial.   Aortic Valve: The aortic valve was not assessed. Aortic valve  regurgitation is not visualized.   Pulmonic Valve: The pulmonic valve was normal in structure. Pulmonic valve  regurgitation is not visualized.   Aorta: The ascending aorta was not well visualized.   IAS/Shunts: No atrial level shunt detected by color flow Doppler.   Additional Comments: Normal LVF.      LEFT VENTRICLE  PLAX 2D  LV EF:         Left            Diastology                 ventricular     LV e' medial:    7.50 cm/s                 ejection        LV E/e' medial:  13.6                 fraction by     LV e' lateral:   9.98 cm/s                 PLAX is 75      LV E/e' lateral: 10.2                 %.  LVIDd:         4.40 cm  LVIDs:         2.50 cm  LV PW:  1.20 cm  LV IVS:        1.20 cm  LVOT diam:     1.90 cm  LV SV:         75  LV SV Index:   36  LVOT Area:     2.84 cm      RIGHT VENTRICLE             IVC  RV Basal diam:  3.40 cm     IVC diam: 1.70 cm  RV S prime:     16.33 cm/s  TAPSE (M-mode): 2.2 cm   LEFT ATRIUM             Index        RIGHT ATRIUM            Index  LA diam:        4.40 cm 2.12 cm/m   RA Area:     12.80 cm  LA Vol (A2C):   42.2 ml 20.32 ml/m  RA Volume:   29.50 ml  14.20 ml/m  LA Vol (A4C):   45.2 ml 21.76 ml/m  LA Biplane Vol: 43.9 ml 21.14 ml/m   AORTIC VALVE  LVOT Vmax:   145.50 cm/s  LVOT Vmean:  91.200 cm/s  LVOT VTI:    0.264 m     AORTA  Ao Root diam: 3.60 cm  Ao Asc diam:  3.60 cm   MITRAL VALVE  MV Area (PHT): 3.60 cm     SHUNTS  MV Decel Time: 211 msec     Systemic VTI:  0.26 m  MV E velocity: 102.00 cm/s  Systemic Diam: 1.90 cm  MV A velocity: 96.80 cm/s  MV E/A ratio:  1.05   Dwayne D Callwood MD  Electronically signed by Yolonda Kida MD  Signature Date/Time: 11/22/2021/12:57:19 PM       Final    Treatments: surgery:  11/24/2021 Patient:  Samuel [name] Pre-Op Dx: CAD with LM stenosis   Post-op Dx:  same Procedure: CABG X 3 with the LIMA to the LAD, RSVG from the aorta to the PDA and from the aorta to the OM   Endoscopic greater saphenous vein harvest on the right  Discharge Exam: Blood pressure (!) 133/90, pulse 90, temperature 98 F (36.7 C), temperature source Oral, resp. rate 20, height 5' 7" (1.702 m), weight 93.3 kg, SpO2 100 %.  General appearance: alert, cooperative, and no distress Heart: regular rate and rhythm Lungs: clear to auscultation bilaterally Abdomen: soft, non-tender; bowel sounds normal; no masses,  no organomegaly Extremities: edema trace Wound: clean and dry   Discharge Medications:  The patient has been discharged on:   1.Beta Blocker:  Yes [  X ]                              No   [   ]                              If No, reason:  2.Ace Inhibitor/ARB: Yes [   ]                                     No  [  X  ]  If No, reason: labile BP  3.Statin:   Yes [ X  ]                  No  [   ]                  If No, reason:  4.Ecasa:  Yes  [ X  ]                  No   [   ]                  If No, reason:  Patient  had ACS upon admission:  Plavix/P2Y12 inhibitor: Yes [  X ]                                      No  [   ]  Discharge Instructions     Amb Referral to Cardiac Rehabilitation   Complete by: As directed    Diagnosis: CABG   CABG X ___: 3   After initial evaluation and assessments completed: Virtual Based Care may be provided alone or in conjunction with Phase 2 Cardiac Rehab based on patient barriers.: Yes   Intensive Cardiac Rehabilitation (ICR) [place] location only OR Traditional Cardiac Rehabilitation (TCR) *If criteria for ICR are not met will enroll in TCR [facility] only): Yes      Allergies as of 11/29/2021   No Known Allergies      Medication List     STOP taking these medications    ALEVE PO   amLODipine 5 MG tablet Commonly known as: NORVASC   heparin 25000 UT/250ML infusion   Rybelsus 7 MG Tabs Generic drug: Semaglutide       TAKE these medications    acetaminophen 500 MG tablet Commonly known as: TYLENOL Take 1-2 tablets (500-1,000 mg total) by mouth every 6 (six) hours as needed.   aspirin EC 81 MG tablet Take 81 mg by mouth daily.   atorvastatin 80 MG tablet Commonly known as: Lipitor Take 1 tablet (80 mg total) by mouth daily.   clopidogrel 75 MG tablet Commonly known as: Plavix Take 1 tablet (75 mg total) by mouth daily.   fluticasone 50 MCG/ACT nasal spray Commonly known as: FLONASE Place 2 sprays into both nostrils as needed for allergies or rhinitis.   furosemide 40 MG tablet Commonly known as: LASIX Take 1 tablet (40 mg total) by mouth daily.   loratadine 10 MG tablet Commonly known as: CLARITIN Take 10 mg by mouth daily.   metFORMIN 500 MG tablet Commonly known as: GLUCOPHAGE Take 1 tablet (500 mg total) by mouth 2 (two) times daily with a meal.   metoprolol tartrate 25 MG tablet Commonly known as: LOPRESSOR Take 0.5 tablets (12.5 mg total) by mouth 2 (two) times daily.   nitroGLYCERIN 0.4 MG SL tablet Commonly known as:  NITROSTAT Place under the tongue.   omeprazole 20 MG capsule Commonly known as: PRILOSEC Take 20 mg by mouth daily before breakfast.   oxybutynin 5 MG 24 hr tablet Commonly known as: DITROPAN-XL Take 5 mg by mouth daily.   oxyCODONE 5 MG immediate release tablet Commonly known as: Oxy IR/ROXICODONE Take 1 tablet (5 mg total) by mouth every 4 (four) hours as needed for severe pain.        Follow-up Information     [facility] [place] Follow up on 12/05/2021.   Specialty: Cardiothoracic Surgery Why: Appointment is at 2:30, please get CXR at 2:00 at [facility] located on first floor of our office building Contact information: [place], [place]        Isaias Cowman, MD. Schedule an appointment as soon as possible for a visit on 12/12/2021.   Specialty: Cardiology Why: Appointment is at 11:15 Contact information: [facility] [place] [address] [phone]                 Signed:  Ellwood Handler, PA-C  11/29/2021, 8:04 AM

## 2021-11-24 NOTE — Interval H&P Note (Signed)
History and Physical Interval Note:  11/24/2021 6:37 AM  Samuel [name]  has presented today for surgery, with the diagnosis of CAD.  The various methods of treatment have been discussed with the patient and family. After consideration of risks, benefits and other options for treatment, the patient has consented to  Procedure(s): CORONARY ARTERY BYPASS GRAFTING (CABG) (N/A) TRANSESOPHAGEAL ECHOCARDIOGRAM (TEE) (N/A) as a surgical intervention.  The patient's history has been reviewed, patient examined, no change in status, stable for surgery.  I have reviewed the patient's chart and labs.  Questions were answered to the patient's satisfaction.     Coralie Common

## 2021-11-24 NOTE — Progress Notes (Signed)
  Transition of Care [facility]) Screening Note   Patient Details  Name: YAHYE SIEBERT Date of Birth: Jan 20, 1951   Transition of Care [facility]) CM/SW Contact:    Milas Gain, [place] Phone Number: 11/24/2021, 5:19 PM    Transition of Care Department [facility]) has reviewed patient and no TOC needs have been identified at this time. We will continue to monitor patient advancement through interdisciplinary progression rounds. If new patient transition needs arise, please place a TOC consult.

## 2021-11-24 NOTE — Consult Note (Signed)
NAME:  Samuel [name], MRN:  [id number], DOB:  Mar 14, 1950, LOS: 2 ADMISSION DATE:  11/22/2021, CONSULTATION DATE:  11/24/21 REFERRING MD:  Lavonna Monarch, CHIEF COMPLAINT:  post-CABG   History of Present Illness:  Samuel [name] is a 71 y/o gentleman with a history of OSA, DM2, HTN, HLD who presented with substernal chest pressure radiating to the left arm. This had been intermittently going on for several weeks. In the ED he had an EKG with TWI in the interior leads. He was started on aspirin and heparin for NSTEMI. LHC demonstrated left main CAD and severe 3-vessel disease.  LV function is preserved. On 9/29 he underwent CABG X 3 with the LIMA to the LAD, RSVG from the aorta to the PDA and from the aorta to the OM.   Pertinent  Medical History  OSA DM2 GERD HTN HLD  Significant Hospital Events: Including procedures, antibiotic start and stop dates in addition to other pertinent events   9/25 admitted with NSTEMI 9/26 LHC 9/29 CABG  Interim History / Subjective:    Objective   Blood pressure 125/84, pulse 64, temperature 97.9 F (36.6 C), temperature source Oral, resp. rate 12, height '5\' 7"'$  (1.702 m), weight 95.8 kg, SpO2 97 %.    Vent Mode: PRVC;SIMV;PSV FiO2 (%):  [50 %] 50 % Set Rate:  [12 bmp] 12 bmp Vt Set:  [520 mL] 520 mL PEEP:  [5 cmH20] 5 cmH20 Pressure Support:  [10 cmH20] 10 cmH20   Intake/Output Summary (Last 24 hours) at 11/24/2021 1255 Last data filed at 11/24/2021 1211 Gross per 24 hour  Intake 3220 ml  Output 2126 ml  Net 1094 ml   Filed Weights   11/22/21 1634  Weight: 95.8 kg    Examination: General: critically ill appearing man lying in bed in NAD HENT: Samuel [name]/AT, eyes anicteric Lungs: CTAB, no ETT secretions Cardiovascular: S1S2, RRR. Not requiring pacing. Small volume of bloody output from mediastinal drains.  Abdomen: soft NT Extremities: RLE dressing intact, no peripheral edema, no cyanosis Neuro: RASS -5, pinpoint pupils GU: foley draining amber urine.  CXR  personally reviewed>ETT ~5cm above carina, mediastinal tubes in place. Mild pulmonary edema. Tiny apical pneumothoraces.  CTA 9/25 personally reviewed> mosaicism, HH, no masses.  WBC 22 H/H 10.2/30.4  EKG> NSR, normal PR, prolonged QRS, normal Qtc. ST depressions I, AVL.  Resolved Hospital Problem list     Assessment & Plan:  CAD with NSTEMI, s/p CABG x 3 -post op care per cardiothoracic surgery -Complete postop antibiotics - Pain control with morphine, oxycodone, tramadol - Telemetry monitoring -Monitor electrolytes and replete as necessary - aspirin, statin -Start metoprolol once off pressors  Postop vent management - Rapid wean protocol - Wean FiO2 as able - Pulmonary hygiene  Hyperglycemia - Insulin infusion per protocol -Goal blood glucose less than 180  Expected postop anemia - Please call cardiothoracic surgery before blood products are administered. -Monitor  Best Practice (right click and "Reselect all SmartList Selections" daily)   Diet/type: NPO DVT prophylaxis: SCD GI prophylaxis: H2B and PPI Lines: Central line and Arterial Line Foley:  Yes, and it is still needed Code Status:  full code Last date of multidisciplinary goals of care discussion '[ ]'$   Labs   CBC: Recent Labs  Lab 11/20/21 1956 11/21/21 0548 11/22/21 0425 11/23/21 0910 11/24/21 0539 11/24/21 0818 11/24/21 1034 11/24/21 1037 11/24/21 1056 11/24/21 1134 11/24/21 1140  WBC 10.1 9.1 9.3 8.6 9.5  --   --   --   --   --   --  NEUTROABS 6.3  --   --   --   --   --   --   --   --   --   --   HGB 14.3 13.1 12.7* 13.2 14.3   < > 9.5* 9.8* 10.2* 9.5* 11.6*  HCT 42.2 39.9 38.3* 39.0 41.2   < > 28.0* 28.1* 30.0* 28.0* 34.0*  MCV 86.7 86.7 86.1 86.5 85.3  --   --   --   --   --   --   PLT 296 231 251 250 249  --   --  225  --   --   --    < > = values in this interval not displayed.    Basic Metabolic Panel: Recent Labs  Lab 11/20/21 1956 11/21/21 0548 11/22/21 0425 11/23/21 0910  11/24/21 0539 11/24/21 0818 11/24/21 3244 11/24/21 0928 11/24/21 0946 11/24/21 1010 11/24/21 1034 11/24/21 1056 11/24/21 1134 11/24/21 1140  NA 137 135 138 136 136   < > 136 136   < > 136 135 138 136 137  K 4.3 3.9 4.3 4.0 4.1   < > 4.5 4.4   < > 5.1 5.3* 5.0 4.5 4.5  CL 103 107 107 104 106  --  104 104  --  104  --  102  --  103  CO2 19* '22 22 22 '$ 21*  --   --   --   --   --   --   --   --   --   GLUCOSE 302* 162* 153* 182* 160*  --  155* 144*  --  129*  --  142*  --  176*  BUN 24* '23 16 13 17  '$ --  15 16  --  14  --  15  --  13  CREATININE 1.30* 0.96 0.99 1.15 1.12  --  1.00 1.00  --  0.80  --  0.80  --  0.80  CALCIUM 9.7 8.9 8.9 9.2 9.6  --   --   --   --   --   --   --   --   --    < > = values in this interval not displayed.   GFR: Estimated Creatinine Clearance: 92.1 mL/min (by C-G formula based on SCr of 0.8 mg/dL). Recent Labs  Lab 11/21/21 0548 11/22/21 0425 11/23/21 0910 11/24/21 0539  WBC 9.1 9.3 8.6 9.5    Liver Function Tests: Recent Labs  Lab 11/20/21 1956  AST 27  ALT 17  ALKPHOS 65  BILITOT 0.6  PROT 7.9  ALBUMIN 3.9   No results for input(s): "LIPASE", "AMYLASE" in the last 168 hours. No results for input(s): "AMMONIA" in the last 168 hours.  ABG    Component Value Date/Time   PHART 7.317 (L) 11/24/2021 1134   PCO2ART 47.5 11/24/2021 1134   PO2ART 296 (H) 11/24/2021 1134   HCO3 24.3 11/24/2021 1134   TCO2 24 11/24/2021 1140   ACIDBASEDEF 2.0 11/24/2021 1134   O2SAT 100 11/24/2021 1134     Coagulation Profile: Recent Labs  Lab 11/20/21 2156 11/21/21 0548  INR 1.1 1.1    Cardiac Enzymes: No results for input(s): "CKTOTAL", "CKMB", "CKMBINDEX", "TROPONINI" in the last 168 hours.  HbA1C: Hgb A1c MFr Bld  Date/Time Value Ref Range Status  11/21/2021 05:48 AM 7.3 (H) 4.8 - 5.6 % Final    Comment:    (NOTE) Pre diabetes:          5.7%-6.4%  Diabetes:              >6.4%  Glycemic control for   <7.0% adults with diabetes    11/03/2019 02:02 PM 6.6 (H) 4.8 - 5.6 % Final    Comment:    (NOTE) Pre diabetes:          5.7%-6.4%  Diabetes:              >6.4%  Glycemic control for   <7.0% adults with diabetes     CBG: Recent Labs  Lab 11/23/21 0810 11/23/21 1212 11/23/21 1648 11/23/21 2130 11/24/21 1249  GLUCAP 188* 127* 133* 143* 130*    Review of Systems:   Unable to be obtained due to mental status.  Past Medical History:  He,  has a past medical history of Anxiety, Arthritis, Cancer ([place]), ED (erectile dysfunction), GERD (gastroesophageal reflux disease), Hyperlipidemia, Hypertension, OSA (obstructive sleep apnea), Stroke ([place]), and Uncontrolled type 2 diabetes mellitus with hyperglycemia, with long-term current use of insulin ([place]).   Surgical History:   Past Surgical History:  Procedure Laterality Date   CARDIAC CATHETERIZATION     COLONOSCOPY     KNEE ARTHROPLASTY Left 03/20/2019   Procedure: LEFT COMPUTER ASSISTED TOTAL KNEE ARTHROPLASTY;  Surgeon: Dereck Leep, MD;  Location: [facility] ORS;  Service: Orthopedics;  Laterality: Left;   KNEE ARTHROPLASTY Right 11/09/2019   Procedure: COMPUTER ASSISTED TOTAL KNEE ARTHROPLASTY;  Surgeon: Dereck Leep, MD;  Location: [facility] ORS;  Service: Orthopedics;  Laterality: Right;   KNEE ARTHROSCOPY Left    LEFT HEART CATH N/A 11/21/2021   Procedure: Left Heart Cath;  Surgeon: Isaias Cowman, MD;  Location: [facility];  Service: Cardiovascular;  Laterality: N/A;   SHOULDER ARTHROSCOPY Right 2009   rotator cuff   TONSILLECTOMY       Social History:   reports that he has never smoked. He has never used smokeless tobacco. He reports current alcohol use of about 2.0 standard drinks of alcohol per week. He reports that he does not use drugs.   Family History:  His family history includes CAD in his brother and sister.   Allergies No Known Allergies   Home Medications  Prior to Admission medications   Medication Sig Start Date End  Date Taking? Authorizing Provider  amLODipine (NORVASC) 5 MG tablet Take 5 mg by mouth daily.   Yes [provider]  aspirin 81 MG EC tablet Take 81 mg by mouth daily.    Yes [provider]  atorvastatin (LIPITOR) 80 MG tablet Take 1 tablet (80 mg total) by mouth daily. 11/21/21 11/21/22 Yes Lavina Hamman, MD  fluticasone Asencion Islam) 50 MCG/ACT nasal spray Place 2 sprays into both nostrils as needed for allergies or rhinitis.   Yes [provider]  heparin 25000 UT/250ML infusion Inject 1,500 Units/hr into the vein continuous. 11/21/21  Yes Lavina Hamman, MD  loratadine (CLARITIN) 10 MG tablet Take 10 mg by mouth daily.   Yes [provider]  Naproxen Sodium (ALEVE PO) Take 1 tablet by mouth as needed (pain).   Yes [provider]  omeprazole (PRILOSEC) 20 MG capsule Take 20 mg by mouth daily before breakfast.   Yes [provider]  oxybutynin (DITROPAN-XL) 5 MG 24 hr tablet Take 5 mg by mouth daily. 09/12/21  Yes [provider]  nitroGLYCERIN (NITROSTAT) 0.4 MG SL tablet Place under the tongue. 11/20/21   [provider]  RYBELSUS 7 MG TABS Take 1 tablet by mouth  daily. Patient not taking: Reported on 11/22/2021 06/30/21   [provider]     Critical care time: 45 min.     Julian Hy, DO 11/24/21 1:10 PM [facility]

## 2021-11-24 NOTE — Progress Notes (Signed)
      [address]       [place] [address]             [phone]       Recently extubated  BP 98/75   Pulse 71   Temp 97.7 F (36.5 C)   Resp 16   Ht '5\' 7"'$  (1.702 m)   Wt 95.8 kg   SpO2 92%   BMI 33.09 kg/m   23/17 CI= 2.2   Intake/Output Summary (Last 24 hours) at 11/24/2021 1702 Last data filed at 11/24/2021 1600 Gross per 24 hour  Intake 4780.91 ml  Output 2881 ml  Net 1899.91 ml   ~ 300 ml from CT since OR  Hct 30, coags OK  Doing well early postop  Remo Lipps C. Roxan Hockey, MD [facility] [phone]

## 2021-11-24 NOTE — Anesthesia Procedure Notes (Signed)
Arterial Line Insertion Start/End9/29/2023 6:45 AM, 11/24/2021 7:00 AM Performed by: Josephine Igo, CRNA, CRNA  Preanesthetic checklist: patient identified, IV checked, site marked, risks and benefits discussed, surgical consent, monitors and equipment checked and timeout performed Lidocaine 1% used for infiltration and patient sedated Left, radial was placed Catheter size: 20 G Hand hygiene performed   Attempts: 1 Procedure performed without using ultrasound guided technique. Following insertion, Biopatch and dressing applied. Post procedure assessment: normal

## 2021-11-24 NOTE — Hospital Course (Signed)
History of Present Illness:     This is a 71 year old male with a past medical history of DM (type II), hypertension, hyperlipidemia, OSA (on CPAP), obesity, stroke, GERD who presented to [facility] ED on 11/20/2021 with complaints of chest pain with exertion, shortness of breath, and fatigue. Per patient, he had  short of breath with exertion about 3 weeks while golfing with his son. He was doing yard work about 2 weeks ago and had chest pain and shortness of breath. He also noticed he has some pain in his left shoulder. He denies LE edema, nausea, vomiting, or palpitations. EKG showed sinus rhythm, RBBB, and inferior T wave inversion. Initial Troponin I (high sensitivity) was 183 and max'd at 198. He ruled in for a NSTEMI.  D dimer was elevated at 1.15 so CTA chest was done and there was no PE. There is CAD and aortic atherosclerosis. Cardiac catheterization done 11/21/2021 showed LVEF 55-65%, ostial to mid LM with 80% stenosis, ostial to proximal Circumflex 90% stenosis, and ostial to proximal RCA with a 90% stenosis. Echo done 11/21/2021 showed LVEF 70-75%, LV has no regional wall motion abnormalities, and no significant valvular disease. Dr. Saralyn Pilar asked for transfer to [facility] for further evaluation and treatment. A cardiothoracic consultation was requested for consideration of coronary artery bypass grafting surgery. At the time of my evaluation, he denied chest pain or shortness of breath. Wife at bedside and patient somewhat emotional.  Dr. Lavonna Monarch reviewed the patient's chart, labs and diagnostic studies and determined that surgical revascularization would provide Mr. Levesque the best long term treatment. Dr. Lavonna Monarch discussed the patient's treatment options as well as the risks and benefits of surgery. After careful consideration Mr. Nazar agreed to move forward with coronary artery bypass grafting surgery.  Hospital Course: Mr. Haffey was an inpatient at [facility], he was brought  to the operating room on 11/24/2021. He underwent a CABG x 3 utilizing LIMA to LAD, RSVG to PDA and RSVG to OM. He tolerated the procedure well and was transferred to the SICU in stable condition.   Postoperative hospital course:  He is remained neurologically intact.  He is maintaining sinus rhythm.  He was extubated from the ventilator using standard post cardiac surgical protocols without difficulty.  He is refusing CPAP due to some issues of how the mask fits.  He has some postoperative expected volume overload and is being diuresed with IV Lasix.  Diabetes has been managed with usual protocols.  He has been resumed on his metformin.  He does have an expected acute blood loss anemia which is being monitored closely.

## 2021-11-24 NOTE — Transfer of Care (Signed)
Immediate Anesthesia Transfer of Care Note  Patient: MARLO GOODRICH  Procedure(s) Performed: CORONARY ARTERY BYPASS GRAFTING (CABG) X3 BYPASSES, USING OPEN LEFT INTERNAL MAMMARY ARTERY AND ENDOSCOPIC RIGHT GREATER SAPHENOUS VEIN HARVEST (Chest) TRANSESOPHAGEAL ECHOCARDIOGRAM (TEE) (Esophagus)  Patient Location: ICU  Anesthesia Type:General  Level of Consciousness: Patient remains intubated per anesthesia plan  Airway & Oxygen Therapy: Patient remains intubated per anesthesia plan and Patient placed on Ventilator (see vital sign flow sheet for setting)  Post-op Assessment: Report given to RN and Post -op Vital signs reviewed and stable  Post vital signs: Reviewed and stable  Last Vitals:  Vitals Value Taken Time  BP 110/78 11/24/21 1235  Temp    Pulse 63 11/24/21 1237  Resp 16 11/24/21 1237  SpO2 97 % 11/24/21 1237  Vitals shown include unvalidated device data.  Last Pain:  Vitals:   11/24/21 0617  TempSrc: Oral  PainSc:          Complications: No notable events documented.

## 2021-11-24 NOTE — Plan of Care (Signed)
  Problem: Clinical Measurements: Goal: Ability to maintain clinical measurements within normal limits will improve Outcome: Progressing Goal: Will remain free from infection Outcome: Progressing Goal: Diagnostic test results will improve Outcome: Progressing Goal: Respiratory complications will improve Outcome: Progressing Goal: Cardiovascular complication will be avoided Outcome: Progressing   Problem: Activity: Goal: Risk for activity intolerance will decrease Outcome: Progressing   Problem: Nutrition: Goal: Adequate nutrition will be maintained Outcome: Progressing   Problem: Coping: Goal: Level of anxiety will decrease Outcome: Progressing   Problem: Pain Managment: Goal: General experience of comfort will improve Outcome: Progressing   Problem: Safety: Goal: Ability to remain free from injury will improve Outcome: Progressing   Problem: Skin Integrity: Goal: Risk for impaired skin integrity will decrease Outcome: Progressing   Problem: Activity: Goal: Ability to tolerate increased activity will improve Outcome: Progressing   Problem: Activity: Goal: Ability to return to baseline activity level will improve Outcome: Progressing   Problem: Cardiac: Goal: Ability to achieve and maintain adequate cardiovascular perfusion will improve Outcome: Progressing   Problem: Cardiovascular: Goal: Ability to achieve and maintain adequate cardiovascular perfusion will improve Outcome: Progressing Goal: Vascular access site(s) Level 0-1 will be maintained Outcome: Progressing   Problem: Activity: Goal: Risk for activity intolerance will decrease Outcome: Progressing   Problem: Cardiac: Goal: Will achieve and/or maintain hemodynamic stability Outcome: Progressing   Problem: Clinical Measurements: Goal: Postoperative complications will be avoided or minimized Outcome: Progressing   Problem: Respiratory: Goal: Respiratory status will improve Outcome:  Progressing   Problem: Skin Integrity: Goal: Wound healing without signs and symptoms of infection Outcome: Progressing Goal: Risk for impaired skin integrity will decrease Outcome: Progressing   Problem: Urinary Elimination: Goal: Ability to achieve and maintain adequate renal perfusion and functioning will improve Outcome: Progressing

## 2021-11-24 NOTE — Anesthesia Postprocedure Evaluation (Signed)
Anesthesia Post Note  Patient: Samuel [name]  Procedure(s) Performed: CORONARY ARTERY BYPASS GRAFTING (CABG) X3 BYPASSES, USING OPEN LEFT INTERNAL MAMMARY ARTERY AND ENDOSCOPIC RIGHT GREATER SAPHENOUS VEIN HARVEST (Chest) TRANSESOPHAGEAL ECHOCARDIOGRAM (TEE) (Esophagus)     Patient location during evaluation: SICU Anesthesia Type: General Level of consciousness: patient remains intubated per anesthesia plan Pain management: pain level controlled Vital Signs Assessment: post-procedure vital signs reviewed and stable Respiratory status: spontaneous breathing Cardiovascular status: stable Postop Assessment: no apparent nausea or vomiting Anesthetic complications: no   No notable events documented.  Last Vitals:  Vitals:   11/24/21 1445 11/24/21 1500  BP:  103/76  Pulse: 63 64  Resp: 17 17  Temp: (!) 36.1 C (!) 36.1 C  SpO2: 99% 100%    Last Pain:  Vitals:   11/24/21 1300  TempSrc: Core  PainSc:                  Orell Hurtado

## 2021-11-25 ENCOUNTER — Inpatient Hospital Stay (HOSPITAL_COMMUNITY): Payer: Medicare Other

## 2021-11-25 DIAGNOSIS — R739 Hyperglycemia, unspecified: Secondary | ICD-10-CM | POA: Diagnosis not present

## 2021-11-25 DIAGNOSIS — I214 Non-ST elevation (NSTEMI) myocardial infarction: Secondary | ICD-10-CM | POA: Diagnosis not present

## 2021-11-25 DIAGNOSIS — I25110 Atherosclerotic heart disease of native coronary artery with unstable angina pectoris: Secondary | ICD-10-CM | POA: Diagnosis not present

## 2021-11-25 DIAGNOSIS — E1165 Type 2 diabetes mellitus with hyperglycemia: Secondary | ICD-10-CM | POA: Diagnosis not present

## 2021-11-25 LAB — CBC
HCT: 28.7 % — ABNORMAL LOW (ref 39.0–52.0)
HCT: 28.2 % — ABNORMAL LOW (ref 39.0–52.0)
Hemoglobin: 9.4 g/dL — ABNORMAL LOW (ref 13.0–17.0)
Hemoglobin: 9.4 g/dL — ABNORMAL LOW (ref 13.0–17.0)
MCH: 29.2 pg (ref 26.0–34.0)
MCH: 30 pg (ref 26.0–34.0)
MCHC: 32.8 g/dL (ref 30.0–36.0)
MCHC: 33.3 g/dL (ref 30.0–36.0)
MCV: 89.1 fL (ref 80.0–100.0)
MCV: 90.1 fL (ref 80.0–100.0)
Platelets: 264 10*3/uL (ref 150–400)
Platelets: 197 10*3/uL (ref 150–400)
RBC: 3.22 MIL/uL — ABNORMAL LOW (ref 4.22–5.81)
RBC: 3.13 MIL/uL — ABNORMAL LOW (ref 4.22–5.81)
RDW: 14.6 % (ref 11.5–15.5)
RDW: 15.1 % (ref 11.5–15.5)
WBC: 16.5 10*3/uL — ABNORMAL HIGH (ref 4.0–10.5)
WBC: 12.7 10*3/uL — ABNORMAL HIGH (ref 4.0–10.5)
nRBC: 0 % (ref 0.0–0.2)
nRBC: 0 % (ref 0.0–0.2)

## 2021-11-25 LAB — BASIC METABOLIC PANEL
Anion gap: 6 (ref 5–15)
Anion gap: 6 (ref 5–15)
BUN: 14 mg/dL (ref 8–23)
BUN: 15 mg/dL (ref 8–23)
CO2: 21 mmol/L — ABNORMAL LOW (ref 22–32)
CO2: 22 mmol/L (ref 22–32)
Calcium: 8.2 mg/dL — ABNORMAL LOW (ref 8.9–10.3)
Calcium: 7.8 mg/dL — ABNORMAL LOW (ref 8.9–10.3)
Chloride: 109 mmol/L (ref 98–111)
Chloride: 106 mmol/L (ref 98–111)
Creatinine, Ser: 1 mg/dL (ref 0.61–1.24)
Creatinine, Ser: 1.22 mg/dL (ref 0.61–1.24)
GFR, Estimated: >60 mL/min (ref >60)
GFR, Estimated: >60 mL/min (ref >60)
Glucose, Bld: 104 mg/dL — ABNORMAL HIGH (ref 70–99)
Glucose, Bld: 154 mg/dL — ABNORMAL HIGH (ref 70–99)
Potassium: 4.3 mmol/L (ref 3.5–5.1)
Potassium: 4 mmol/L (ref 3.5–5.1)
Sodium: 136 mmol/L (ref 135–145)
Sodium: 134 mmol/L — ABNORMAL LOW (ref 135–145)

## 2021-11-25 LAB — MAGNESIUM
Magnesium: 2.7 mg/dL — ABNORMAL HIGH (ref 1.7–2.4)
Magnesium: 2.4 mg/dL (ref 1.7–2.4)

## 2021-11-25 LAB — GLUCOSE, CAPILLARY
Glucose-Capillary: 112 mg/dL — ABNORMAL HIGH (ref 70–99)
Glucose-Capillary: 104 mg/dL — ABNORMAL HIGH (ref 70–99)
Glucose-Capillary: 115 mg/dL — ABNORMAL HIGH (ref 70–99)
Glucose-Capillary: 121 mg/dL — ABNORMAL HIGH (ref 70–99)
Glucose-Capillary: 126 mg/dL — ABNORMAL HIGH (ref 70–99)
Glucose-Capillary: 126 mg/dL — ABNORMAL HIGH (ref 70–99)
Glucose-Capillary: 97 mg/dL (ref 70–99)
Glucose-Capillary: 131 mg/dL — ABNORMAL HIGH (ref 70–99)
Glucose-Capillary: 153 mg/dL — ABNORMAL HIGH (ref 70–99)
Glucose-Capillary: 176 mg/dL — ABNORMAL HIGH (ref 70–99)

## 2021-11-25 MED ORDER — CYCLOBENZAPRINE HCL 10 MG PO TABS
5.0000 mg | ORAL_TABLET | Freq: Three times a day (TID) | ORAL | Status: DC | PRN
  Administered 2021-11-25 – 2021-11-28 (×11): 5 mg via ORAL
  Filled 2021-11-25 (×11): qty 1

## 2021-11-25 MED ORDER — INSULIN DETEMIR 100 UNIT/ML ~~LOC~~ SOLN
15.0000 [IU] | Freq: Two times a day (BID) | SUBCUTANEOUS | Status: DC
  Administered 2021-11-25 – 2021-11-29 (×8): 15 [IU] via SUBCUTANEOUS
  Filled 2021-11-25 (×12): qty 0.15

## 2021-11-25 MED ORDER — ENOXAPARIN SODIUM 40 MG/0.4ML IJ SOSY
40.0000 mg | PREFILLED_SYRINGE | Freq: Every day | INTRAMUSCULAR | Status: DC
  Administered 2021-11-25 – 2021-11-26 (×2): 40 mg via SUBCUTANEOUS
  Filled 2021-11-25 (×2): qty 0.4

## 2021-11-25 MED ORDER — INSULIN ASPART 100 UNIT/ML IJ SOLN
0.0000 [IU] | INTRAMUSCULAR | Status: DC
  Administered 2021-11-25 (×2): 4 [IU] via SUBCUTANEOUS
  Administered 2021-11-25 – 2021-11-26 (×3): 2 [IU] via SUBCUTANEOUS

## 2021-11-25 NOTE — Progress Notes (Signed)
1 Day Post-Op Procedure(s) (LRB): CORONARY ARTERY BYPASS GRAFTING (CABG) X3 BYPASSES, USING OPEN LEFT INTERNAL MAMMARY ARTERY AND ENDOSCOPIC RIGHT GREATER SAPHENOUS VEIN HARVEST (N/A) TRANSESOPHAGEAL ECHOCARDIOGRAM (TEE) (N/A) Subjective: C/o pain after taking meds this AM  Objective: Vital signs in last 24 hours: Temp:  [96.6 F (35.9 C)-99 F (37.2 C)] 98.1 F (36.7 C) (09/30 0815) Pulse Rate:  [62-82] 74 (09/30 0815) Cardiac Rhythm: Normal sinus rhythm (09/30 0800) Resp:  [0-33] 10 (09/30 0815) BP: (89-118)/(58-81) 101/63 (09/30 0800) SpO2:  [88 %-100 %] 95 % (09/30 0815) Arterial Line BP: (95-170)/(44-87) 134/53 (09/30 0815) FiO2 (%):  [40 %-50 %] 40 % (09/29 1541) Weight:  [100.6 kg] 100.6 kg (09/30 0600)  Hemodynamic parameters for last 24 hours: PAP: (13-38)/(3-23) 19/14 CO:  [3.2 L/min-5.1 L/min] 5.1 L/min CI:  [1.6 L/min/m2-2.5 L/min/m2] 2.5 L/min/m2  Intake/Output from previous day: 09/29 0701 - 09/30 0700 In: 6801.7 [I.V.:4489.6; Blood:220; IV Piggyback:2092.2] Out: 5400 [Urine:3115; Blood:376; Chest Tube:650] Intake/Output this shift: Total I/O In: 84.3 [I.V.:84.3] Out: 125 [Urine:105; Chest Tube:20]  General appearance: alert, cooperative, and no distress Neurologic: intact Heart: regular rate and rhythm Lungs: diminished breath sounds bibasilar Abdomen: normal findings: soft, non-tender  Lab Results: Recent Labs    11/24/21 1843 11/25/21 0317  WBC 15.2* 16.5*  HGB 9.6* 9.4*  HCT 29.4* 28.7*  PLT 212 264   BMET:  Recent Labs    11/24/21 1843 11/25/21 0317  NA 137 136  K 4.4 4.3  CL 108 109  CO2 21* 21*  GLUCOSE 134* 104*  BUN 14 14  CREATININE 1.04 1.00  CALCIUM 8.0* 8.2*    PT/INR:  Recent Labs    11/24/21 1259  LABPROT 15.0  INR 1.2   ABG    Component Value Date/Time   PHART 7.340 (L) 11/24/2021 1658   HCO3 23.3 11/24/2021 1658   TCO2 25 11/24/2021 1658   ACIDBASEDEF 3.0 (H) 11/24/2021 1658   O2SAT 87 11/24/2021 1658    CBG (last 3)  Recent Labs    11/25/21 0558 11/25/21 0658 11/25/21 0900  GLUCAP 121* 126* 126*    Assessment/Plan: S/P Procedure(s) (LRB): CORONARY ARTERY BYPASS GRAFTING (CABG) X3 BYPASSES, USING OPEN LEFT INTERNAL MAMMARY ARTERY AND ENDOSCOPIC RIGHT GREATER SAPHENOUS VEIN HARVEST (N/A) TRANSESOPHAGEAL ECHOCARDIOGRAM (TEE) (N/A) POD # 1 NEURO- intact CV- in SR, good hemodynamics  ASA, beta blocker, statin  Dc Swan and A line RESP- IS for atelectasis RENAL- creatinine and lytes OK  Diurese ENDO- CBG mildly elevated  Transition from drip to SSI GI- advance diet as tolerated Anemia secondary to ABL- mild, follow SCD + enoxaparin Dc chest tubes Mobilize   LOS: 3 days    Samuel [name] 11/25/2021

## 2021-11-25 NOTE — Progress Notes (Signed)
      [address]       [place] [address]             [phone]      POD # 1  Pain issues continue  BP 137/86   Pulse 91   Temp 99.2 F (37.3 C) (Oral)   Resp (!) 25   Ht '5\' 7"'$  (1.702 m)   Wt 100.6 kg   SpO2 90%   BMI 34.74 kg/m  6L [place] 90% sat Intake/Output Summary (Last 24 hours) at 11/25/2021 1826 Last data filed at 11/25/2021 1800 Gross per 24 hour  Intake 1814.91 ml  Output 1205 ml  Net 609.91 ml   Creatinine 1.22, K 4.0 Hgb 9.4  Continue current Rx Encourage to take pain meds as needed  Remo Lipps C. Roxan Hockey, MD [facility] [phone]

## 2021-11-25 NOTE — Progress Notes (Signed)
   VASCULAR SURGERY ASSESSMENT & PLAN:   ASYMPTOMATIC GREATER THAN 80% RIGHT CAROTID STENOSIS: This patient underwent coronary revascularization yesterday.  He remains neurologically intact.  Dr. Stanford Breed plans elective carotid revascularization on the right.   Dr. Stanford Breed will check back Monday.    SUBJECTIVE:   No complaints this morning.  PHYSICAL EXAM:   Vitals:   11/25/21 0600 11/25/21 0615 11/25/21 0630 11/25/21 0700  BP: 105/60   101/67  Pulse: 77 79 75 74  Resp: (!) 23 (!) 33 18 18  Temp: 99 F (37.2 C) 98.8 F (37.1 C) 98.6 F (37 C) 98.4 F (36.9 C)  TempSrc:      SpO2: (!) 88% 93% 93% 94%  Weight: 100.6 kg     Height:       No focal weakness or paresthesias.  LABS:   Lab Results  Component Value Date   WBC 16.5 (H) 11/25/2021   HGB 9.4 (L) 11/25/2021   HCT 28.7 (L) 11/25/2021   MCV 89.1 11/25/2021   PLT 264 11/25/2021   Lab Results  Component Value Date   CREATININE 1.00 11/25/2021   Lab Results  Component Value Date   INR 1.2 11/24/2021   CBG (last 3)  Recent Labs    11/25/21 0445 11/25/21 0558 11/25/21 0658  GLUCAP 115* 121* 126*    PROBLEM LIST:    Principal Problem:   NSTEMI (non-ST elevated myocardial infarction) ([place]) Active Problems:   Dyslipidemia associated with type 2 diabetes mellitus (HCC)   OSA on CPAP   Uncontrolled type 2 diabetes mellitus with hyperglycemia, without long-term current use of insulin (HCC)   Essential hypertension   Acute myocardial infarction (non-STEMI) involving left main coronary artery ([place])   Coronary artery disease involving native coronary artery of native heart with unstable angina pectoris (HCC)   Carotid artery stenosis, asymptomatic, bilateral   S/P CABG x 3   CURRENT MEDS:    acetaminophen  1,000 mg Oral Q6H   Or   acetaminophen (TYLENOL) oral liquid 160 mg/5 mL  1,000 mg Per Tube Q6H   aspirin EC  325 mg Oral Daily   Or   aspirin  324 mg Per Tube Daily   atorvastatin  80 mg Oral Daily    bisacodyl  10 mg Oral Daily   Or   bisacodyl  10 mg Rectal Daily   Chlorhexidine Gluconate Cloth  6 each Topical Daily   docusate sodium  200 mg Oral Daily   loratadine  10 mg Oral Daily   metoprolol tartrate  12.5 mg Oral BID   Or   metoprolol tartrate  12.5 mg Per Tube BID   oxybutynin  5 mg Oral Daily   [START ON 11/26/2021] pantoprazole  40 mg Oral Daily   sodium chloride flush  10-40 mL Intracatheter Q12H   sodium chloride flush  3 mL Intravenous Q12H    Deitra Mayo Office: [phone] 11/25/2021

## 2021-11-25 NOTE — Progress Notes (Addendum)
NAME:  Samuel [name], MRN:  [id number], DOB:  December 11, 1950, LOS: 3 ADMISSION DATE:  11/22/2021, CONSULTATION DATE:  11/24/21 REFERRING MD:  Lavonna Monarch, CHIEF COMPLAINT:  post-CABG   History of Present Illness:  Samuel [name] is a 71 y/o gentleman with a history of OSA, DM2, HTN, HLD who presented with substernal chest pressure radiating to the left arm. This had been intermittently going on for several weeks. In the ED he had an EKG with TWI in the interior leads. He was started on aspirin and heparin for NSTEMI. LHC demonstrated left main CAD and severe 3-vessel disease.  LV function is preserved. On 9/29 he underwent CABG X 3 with the LIMA to the LAD, RSVG from the aorta to the PDA and from the aorta to the OM.   Pertinent  Medical History  OSA DM2 GERD HTN HLD  Significant Hospital Events: Including procedures, antibiotic start and stop dates in addition to other pertinent events   9/25 admitted with NSTEMI 9/26 LHC 9/29 CABG, extubated post-op  Interim History / Subjective:  This morning having midline and side pain, especially with breathing. No appetite.  Objective   Blood pressure 101/63, pulse 74, temperature 98.1 F (36.7 C), resp. rate 10, height '5\' 7"'$  (1.702 m), weight 100.6 kg, SpO2 95 %. PAP: (13-38)/(3-23) 19/14 CO:  [3.2 L/min-5.1 L/min] 5.1 L/min CI:  [1.6 L/min/m2-2.5 L/min/m2] 2.5 L/min/m2  Vent Mode: PSV;CPAP FiO2 (%):  [40 %-50 %] 40 % Set Rate:  [4 bmp-16 bmp] 4 bmp Vt Set:  [520 mL] 520 mL PEEP:  [5 cmH20] 5 cmH20 Pressure Support:  [10 cmH20] 10 cmH20   Intake/Output Summary (Last 24 hours) at 11/25/2021 0947 Last data filed at 11/25/2021 0900 Gross per 24 hour  Intake 5086.04 ml  Output 4076 ml  Net 1010.04 ml    Filed Weights   11/22/21 1634 11/25/21 0600  Weight: 95.8 kg 100.6 kg    Examination: General: Ill-appearing man lying in bed no acute distress HENT: [place]/AT, eyes anicteric Lungs: Breathing comfortably on nasal cannula, decreased breath sounds in  the bases.  No conversational dyspnea. Cardiovascular: S1-S2, regular rate and rhythm Abdomen: Soft, nontender Extremities: Minimal pedal edema, no cyanosis Neuro: Awake and alert, normal speech, answering questions appropriately.  Not moving around much in bed GU: Foley draining clear yellow urine  CXR personally reviewed> left pleural effusion, mediastinal drains in place.  RIJ CVC.  BUN 14 Creatinine 1.0 WBC 16.5  H/H 9.4/28.7   Resolved Hospital Problem list     Assessment & Plan:  CAD with NSTEMI, s/p CABG x 3 -Postop care per cardiothoracic surgery - Removing chest tubes today.  [place] removed. -Complete postop antibiotics today - Postop pain control with morphine, oxycodone, tramadol as needed.  Adding Flexeril as needed for muscle spasm pain. - Pulmonary hygiene, out of bed mobility - Continue telemetry monitoring - Monitor electrolytes and replete as needed - Aspirin, statin, metoprolol. Plavix timing per TCTS.  Postop vent management H/o OSA -nocturnal CPAP -Extubated postoperatively.  Continue weaning supplemental oxygen. - Pulmonary hygiene out of bed mobility  Hyperglycemia -Transition to basal bolus insulin.  Sliding scale insulin as needed.  Expected postop anemia - Please call cardiothoracic surgery before blood products are administered. -Continue to monitor, no indication for transfusion currently.  Carotid artery stenosis -vascular surgery monitoring  Wife updated at bedside during rounds.  Best Practice (right click and "Reselect all SmartList Selections" daily)   Diet/type: Regular consistency (see orders) DVT prophylaxis: SCD GI prophylaxis:  PPI Lines: Central line and Arterial Line Foley:  Yes, and it is still needed Code Status:  full code Last date of multidisciplinary goals of care discussion '[ ]'$   Labs   CBC: Recent Labs  Lab 11/20/21 1956 11/21/21 0548 11/23/21 0910 11/24/21 0539 11/24/21 0818 11/24/21 1037 11/24/21 1056  11/24/21 1259 11/24/21 1356 11/24/21 1602 11/24/21 1658 11/24/21 1843 11/25/21 0317  WBC 10.1   < > 8.6 9.5  --   --   --  22.2*  --   --   --  15.2* 16.5*  NEUTROABS 6.3  --   --   --   --   --   --   --   --   --   --   --   --   HGB 14.3   < > 13.2 14.3   < > 9.8*   < > 10.2* 9.9* 9.5* 9.5* 9.6* 9.4*  HCT 42.2   < > 39.0 41.2   < > 28.1*   < > 30.4* 29.0* 28.0* 28.0* 29.4* 28.7*  MCV 86.7   < > 86.5 85.3  --   --   --  88.4  --   --   --  89.6 89.1  PLT 296   < > 250 249  --  225  --  221  --   --   --  212 264   < > = values in this interval not displayed.     Basic Metabolic Panel: Recent Labs  Lab 11/22/21 0425 11/23/21 0910 11/24/21 0539 11/24/21 0818 11/24/21 1010 11/24/21 1034 11/24/21 1056 11/24/21 1134 11/24/21 1140 11/24/21 1251 11/24/21 1356 11/24/21 1602 11/24/21 1658 11/24/21 1843 11/25/21 0317  NA 138 136 136   < > 136   < > 138   < > 137   < > 138 138 138 137 136  K 4.3 4.0 4.1   < > 5.1   < > 5.0   < > 4.5   < > 4.5 4.7 5.2* 4.4 4.3  CL 107 104 106   < > 104  --  102  --  103  --   --   --   --  108 109  CO2 22 22 21*  --   --   --   --   --   --   --   --   --   --  21* 21*  GLUCOSE 153* 182* 160*   < > 129*  --  142*  --  176*  --   --   --   --  134* 104*  BUN '16 13 17   '$ < > 14  --  15  --  13  --   --   --   --  14 14  CREATININE 0.99 1.15 1.12   < > 0.80  --  0.80  --  0.80  --   --   --   --  1.04 1.00  CALCIUM 8.9 9.2 9.6  --   --   --   --   --   --   --   --   --   --  8.0* 8.2*  MG  --   --   --   --   --   --   --   --   --   --   --   --   --  3.1* 2.7*   < > =  values in this interval not displayed.    GFR: Estimated Creatinine Clearance: 75.5 mL/min (by C-G formula based on SCr of 1 mg/dL). Recent Labs  Lab 11/24/21 0539 11/24/21 1259 11/24/21 1843 11/25/21 0317  WBC 9.5 22.2* 15.2* 16.5*      Julian Hy, DO 11/25/21 9:56 AM [facility]

## 2021-11-25 NOTE — Progress Notes (Signed)
Pt on 6L [place] with CPAP order. Pt prefers home CPAP over our machine, and we are unable to bleed O2 through his home machine. Pt has decided to just sleep with his [place] and no CPAP.

## 2021-11-26 ENCOUNTER — Inpatient Hospital Stay (HOSPITAL_COMMUNITY): Payer: Medicare Other

## 2021-11-26 DIAGNOSIS — K5903 Drug induced constipation: Secondary | ICD-10-CM

## 2021-11-26 LAB — CBC
HCT: 26.9 % — ABNORMAL LOW (ref 39.0–52.0)
Hemoglobin: 8.7 g/dL — ABNORMAL LOW (ref 13.0–17.0)
MCH: 29.5 pg (ref 26.0–34.0)
MCHC: 32.3 g/dL (ref 30.0–36.0)
MCV: 91.2 fL (ref 80.0–100.0)
Platelets: 183 10*3/uL (ref 150–400)
RBC: 2.95 MIL/uL — ABNORMAL LOW (ref 4.22–5.81)
RDW: 15.2 % (ref 11.5–15.5)
WBC: 12.8 10*3/uL — ABNORMAL HIGH (ref 4.0–10.5)
nRBC: 0 % (ref 0.0–0.2)

## 2021-11-26 LAB — BASIC METABOLIC PANEL
Anion gap: 7 (ref 5–15)
BUN: 14 mg/dL (ref 8–23)
CO2: 24 mmol/L (ref 22–32)
Calcium: 8.6 mg/dL — ABNORMAL LOW (ref 8.9–10.3)
Chloride: 104 mmol/L (ref 98–111)
Creatinine, Ser: 1.03 mg/dL (ref 0.61–1.24)
GFR, Estimated: >60 mL/min (ref >60)
Glucose, Bld: 146 mg/dL — ABNORMAL HIGH (ref 70–99)
Potassium: 4.1 mmol/L (ref 3.5–5.1)
Sodium: 135 mmol/L (ref 135–145)

## 2021-11-26 LAB — GLUCOSE, CAPILLARY
Glucose-Capillary: 123 mg/dL — ABNORMAL HIGH (ref 70–99)
Glucose-Capillary: 141 mg/dL — ABNORMAL HIGH (ref 70–99)
Glucose-Capillary: 150 mg/dL — ABNORMAL HIGH (ref 70–99)
Glucose-Capillary: 153 mg/dL — ABNORMAL HIGH (ref 70–99)
Glucose-Capillary: 156 mg/dL — ABNORMAL HIGH (ref 70–99)
Glucose-Capillary: 202 mg/dL — ABNORMAL HIGH (ref 70–99)

## 2021-11-26 MED ORDER — INSULIN ASPART 100 UNIT/ML IJ SOLN: 0.0000 [IU] | Freq: Three times a day (TID) | INTRAMUSCULAR | Status: DC

## 2021-11-26 MED ORDER — INSULIN ASPART 100 UNIT/ML IJ SOLN
0.0000 [IU] | Freq: Three times a day (TID) | INTRAMUSCULAR | Status: DC
  Administered 2021-11-26 (×3): 2 [IU] via SUBCUTANEOUS
  Administered 2021-11-26: 8 [IU] via SUBCUTANEOUS
  Administered 2021-11-27 – 2021-11-29 (×4): 2 [IU] via SUBCUTANEOUS

## 2021-11-26 MED ORDER — KETOROLAC TROMETHAMINE 15 MG/ML IJ SOLN
15.0000 mg | Freq: Four times a day (QID) | INTRAMUSCULAR | Status: AC
  Administered 2021-11-26 – 2021-11-27 (×4): 15 mg via INTRAVENOUS
  Filled 2021-11-26 (×4): qty 1

## 2021-11-26 MED ORDER — FUROSEMIDE 40 MG PO TABS
40.0000 mg | ORAL_TABLET | Freq: Every day | ORAL | Status: DC
  Administered 2021-11-27 – 2021-11-29 (×3): 40 mg via ORAL
  Filled 2021-11-26 (×3): qty 1

## 2021-11-26 MED ORDER — BISACODYL 10 MG RE SUPP
10.0000 mg | Freq: Once | RECTAL | Status: AC
  Administered 2021-11-26: 10 mg via RECTAL
  Filled 2021-11-26: qty 1

## 2021-11-26 MED ORDER — BENZONATATE 100 MG PO CAPS: 200.0000 mg | ORAL_CAPSULE | Freq: Three times a day (TID) | ORAL | Status: DC | PRN

## 2021-11-26 MED ORDER — METFORMIN HCL 500 MG PO TABS
500.0000 mg | ORAL_TABLET | Freq: Two times a day (BID) | ORAL | Status: DC
  Administered 2021-11-26 – 2021-11-29 (×6): 500 mg via ORAL
  Filled 2021-11-26 (×6): qty 1

## 2021-11-26 MED ORDER — FUROSEMIDE 10 MG/ML IJ SOLN
40.0000 mg | Freq: Once | INTRAMUSCULAR | Status: AC
  Administered 2021-11-26: 40 mg via INTRAVENOUS
  Filled 2021-11-26: qty 4

## 2021-11-26 MED ORDER — POTASSIUM CHLORIDE ER 10 MEQ PO TBCR
20.0000 meq | EXTENDED_RELEASE_TABLET | Freq: Every day | ORAL | Status: DC
  Administered 2021-11-26: 20 meq via ORAL
  Filled 2021-11-26 (×4): qty 2

## 2021-11-26 NOTE — Progress Notes (Signed)
Santiago Glad RN aware of order to remove CVC.

## 2021-11-26 NOTE — Progress Notes (Signed)
      [address]       [place] [address]             [phone]      POD # 2  Feels better this evening  BP 105/69   Pulse 77   Temp 98.5 F (36.9 C) (Oral)   Resp 14   Ht '5\' 7"'$  (1.702 m)   Wt 100 kg   SpO2 97%   BMI 34.53 kg/m  6L [place] 97% sat  Intake/Output Summary (Last 24 hours) at 11/26/2021 1826 Last data filed at 11/26/2021 1700 Gross per 24 hour  Intake 1079.86 ml  Output 2150 ml  Net -1070.14 ml   Diuresing well Pain control better with Toradol  Remo Lipps C. Roxan Hockey, MD [facility] [phone]

## 2021-11-26 NOTE — Progress Notes (Signed)
Pt is comfortable with his home CPAP tonight and will call RT if he changes his mind.

## 2021-11-26 NOTE — Progress Notes (Signed)
NAME:  Samuel [name], MRN:  [id number], DOB:  [birth year], LOS: 4 ADMISSION DATE:  11/22/2021, CONSULTATION DATE:  11/24/21 REFERRING MD:  Lavonna Monarch, CHIEF COMPLAINT:  post-CABG   History of Present Illness:  Mr. Tormey is a 71 y/o gentleman with a history of OSA, DM2, HTN, HLD who presented with substernal chest pressure radiating to the left arm. This had been intermittently going on for several weeks. In the ED he had an EKG with TWI in the interior leads. He was started on aspirin and heparin for NSTEMI. LHC demonstrated left main CAD and severe 3-vessel disease.  LV function is preserved. On 9/29 he underwent CABG X 3 with the LIMA to the LAD, RSVG from the aorta to the PDA and from the aorta to the OM.   Pertinent  Medical History  OSA DM2 GERD HTN HLD  Significant Hospital Events: Including procedures, antibiotic start and stop dates in addition to other pertinent events   9/25 admitted with NSTEMI 9/26 LHC 9/29 CABG, extubated post-op  Interim History / Subjective:  Still having pain today, but improved. He did not sleep well last night. Afebrile overnight. No BM yet and not eating much.  Objective   Blood pressure 130/74, pulse 83, temperature 98.6 F (37 C), temperature source Oral, resp. rate 15, height '5\' 7"'$  (1.702 m), weight 100 kg, SpO2 96 %.        Intake/Output Summary (Last 24 hours) at 11/26/2021 1023 Last data filed at 11/26/2021 0944 Gross per 24 hour  Intake 1005.87 ml  Output 940 ml  Net 65.87 ml    Filed Weights   11/22/21 1634 11/25/21 0600 11/26/21 0500  Weight: 95.8 kg 100.6 kg 100 kg    Examination: General: elderly man sitting up in the chair in NAD HENT: [place]/AT, eyes anicteric Lungs: breathing comfortably on [place], reduced basilar breath sounds, no wheezing Cardiovascular: S1S2, RRR Abdomen: soft, NT Extremities: Minimal pedal edema, no cyanosis Neuro: fatigued appearing, maintains alertness, moving all extremities, answering questions  appropriately Derm: midline incision dressed, no rashes  CXR personally reviewed> low lung volumes, small left effusion  BUN 14 Cr 1.03 Creatinine 1.0 WBC 12.8  H/H 8.7/26.9   Resolved Hospital Problem list     Assessment & Plan:  CAD with NSTEMI, s/p CABG x 3 -post-op care per TCTS -morphine, tramadol, oxycodone PRN for pain; toradol added today -cont flexeril PRN  -OOB mobility, pulmonary hygiene - tele monitoring, replete PRN - Aspirin, statin, metoprolol. Plavix timing per TCTS.   Postop vent management H/o OSA -refused CPAP overnight -con't weaning O2 -agree with lasix -aggressive pulmonary hygiene, OOB mobility  Hyperglycemia- uncontrolled, DM2 -con't levemir 15 units BID; no plans to increase with addition of metformin -con't SSI PRN -con't holding PTA metformin -goal BG <180  Expected postop anemia - Please call cardiothoracic surgery before blood products are administered. -con't to monitor  Carotid artery stenosis -vascular surgery monitoring  Constipation -dulcolax suppository -improve mobility as able  Wife updated at bedside during rounds.  Best Practice (right click and "Reselect all SmartList Selections" daily)   Diet/type: Regular consistency (see orders) DVT prophylaxis: SCD GI prophylaxis: PPI Lines: Central line and Arterial Line Foley:  Yes, and it is still needed Code Status:  full code Last date of multidisciplinary goals of care discussion '[ ]'$   Labs   CBC: Recent Labs  Lab 11/20/21 1956 11/21/21 0548 11/24/21 1259 11/24/21 1356 11/24/21 1658 11/24/21 1843 11/25/21 0317 11/25/21 1612 11/26/21 0402  WBC 10.1   < >  22.2*  --   --  15.2* 16.5* 12.7* 12.8*  NEUTROABS 6.3  --   --   --   --   --   --   --   --   HGB 14.3   < > 10.2*   < > 9.5* 9.6* 9.4* 9.4* 8.7*  HCT 42.2   < > 30.4*   < > 28.0* 29.4* 28.7* 28.2* 26.9*  MCV 86.7   < > 88.4  --   --  89.6 89.1 90.1 91.2  PLT 296   < > 221  --   --  212 264 197 183   < > =  values in this interval not displayed.     Basic Metabolic Panel: Recent Labs  Lab 11/24/21 0539 11/24/21 0818 11/24/21 1140 11/24/21 1251 11/24/21 1658 11/24/21 1843 11/25/21 0317 11/25/21 1612 11/26/21 0402  NA 136   < > 137   < > 138 137 136 134* 135  K 4.1   < > 4.5   < > 5.2* 4.4 4.3 4.0 4.1  CL 106   < > 103  --   --  108 109 106 104  CO2 21*  --   --   --   --  21* 21* 22 24  GLUCOSE 160*   < > 176*  --   --  134* 104* 154* 146*  BUN 17   < > 13  --   --  '14 14 15 14  '$ CREATININE 1.12   < > 0.80  --   --  1.04 1.00 1.22 1.03  CALCIUM 9.6  --   --   --   --  8.0* 8.2* 7.8* 8.6*  MG  --   --   --   --   --  3.1* 2.7* 2.4  --    < > = values in this interval not displayed.    GFR: Estimated Creatinine Clearance: 73.1 mL/min (by C-G formula based on SCr of 1.03 mg/dL). Recent Labs  Lab 11/24/21 1843 11/25/21 0317 11/25/21 1612 11/26/21 0402  WBC 15.2* 16.5* 12.7* 12.8*      Julian Hy, DO 11/26/21 6:15 PM [facility]

## 2021-11-26 NOTE — Progress Notes (Signed)
2 Days Post-Op Procedure(s) (LRB): CORONARY ARTERY BYPASS GRAFTING (CABG) X3 BYPASSES, USING OPEN LEFT INTERNAL MAMMARY ARTERY AND ENDOSCOPIC RIGHT GREATER SAPHENOUS VEIN HARVEST (N/A) TRANSESOPHAGEAL ECHOCARDIOGRAM (TEE) (N/A) Subjective: C/o cough and pain  Objective: Vital signs in last 24 hours: Temp:  [97.8 F (36.6 C)-99.2 F (37.3 C)] 98.6 F (37 C) (10/01 0807) Pulse Rate:  [70-99] 83 (10/01 0900) Cardiac Rhythm: Normal sinus rhythm (10/01 0700) Resp:  [11-35] 15 (10/01 0900) BP: (104-138)/(62-87) 129/74 (10/01 0900) SpO2:  [86 %-98 %] 96 % (10/01 0900) Weight:  [100 kg] 100 kg (10/01 0500)  Hemodynamic parameters for last 24 hours:    Intake/Output from previous day: 09/30 0701 - 10/01 0700 In: 866 [P.O.:200; I.V.:366.2; IV Piggyback:299.9] Out: 865 [Urine:845; Chest Tube:20] Intake/Output this shift: Total I/O In: 250 [P.O.:240; I.V.:10] Out: 100 [Urine:100]  General appearance: alert, cooperative, and no distress Neurologic: intact Heart: regular rate and rhythm Lungs: diminished breath sounds bibasilar Abdomen: normal findings: soft, non-tender  Lab Results: Recent Labs    11/25/21 1612 11/26/21 0402  WBC 12.7* 12.8*  HGB 9.4* 8.7*  HCT 28.2* 26.9*  PLT 197 183   BMET:  Recent Labs    11/25/21 1612 11/26/21 0402  NA 134* 135  K 4.0 4.1  CL 106 104  CO2 22 24  GLUCOSE 154* 146*  BUN 15 14  CREATININE 1.22 1.03  CALCIUM 7.8* 8.6*    PT/INR:  Recent Labs    11/24/21 1259  LABPROT 15.0  INR 1.2   ABG    Component Value Date/Time   PHART 7.340 (L) 11/24/2021 1658   HCO3 23.3 11/24/2021 1658   TCO2 25 11/24/2021 1658   ACIDBASEDEF 3.0 (H) 11/24/2021 1658   O2SAT 87 11/24/2021 1658   CBG (last 3)  Recent Labs    11/26/21 0009 11/26/21 0405 11/26/21 0804  GLUCAP 153* 141* 202*    Assessment/Plan: S/P Procedure(s) (LRB): CORONARY ARTERY BYPASS GRAFTING (CABG) X3 BYPASSES, USING OPEN LEFT INTERNAL MAMMARY ARTERY AND ENDOSCOPIC  RIGHT GREATER SAPHENOUS VEIN HARVEST (N/A) TRANSESOPHAGEAL ECHOCARDIOGRAM (TEE) (N/A) POD # 2 CABG NEURO- intact CV- in SR, BP well controlled  ASA, statin, beta blocker RESP- primary issue is atelectasis  Only 250 cc with IS  Continue IS  Refused CPAP last night due to mask issues RENAL- creatinine normal and lytes OK  IV diuresis this AM for fluid overload ENDO- CBG moderately elevated  Was taking metformin at home, but not listed in home meds  Metformin 500 mg BID  Continue Levemir + SSI Gi- diet as tolerated Anemia secondary to ABL- monitor SCD + enoxaparin Cardiac rehab Will give Toradol x 24 hours to help with pain management   LOS: 4 days    Melrose Nakayama 11/26/2021

## 2021-11-27 ENCOUNTER — Inpatient Hospital Stay (HOSPITAL_COMMUNITY): Payer: Medicare Other

## 2021-11-27 ENCOUNTER — Encounter (HOSPITAL_COMMUNITY): Payer: Self-pay | Admitting: Thoracic Surgery (Cardiothoracic Vascular Surgery)

## 2021-11-27 LAB — CBC
HCT: 26.7 % — ABNORMAL LOW (ref 39.0–52.0)
Hemoglobin: 9 g/dL — ABNORMAL LOW (ref 13.0–17.0)
MCH: 30 pg (ref 26.0–34.0)
MCHC: 33.7 g/dL (ref 30.0–36.0)
MCV: 89 fL (ref 80.0–100.0)
Platelets: 200 10*3/uL (ref 150–400)
RBC: 3 MIL/uL — ABNORMAL LOW (ref 4.22–5.81)
RDW: 15.4 % (ref 11.5–15.5)
WBC: 10.8 10*3/uL — ABNORMAL HIGH (ref 4.0–10.5)
nRBC: 0 % (ref 0.0–0.2)

## 2021-11-27 LAB — BASIC METABOLIC PANEL
Anion gap: 11 (ref 5–15)
BUN: 21 mg/dL (ref 8–23)
CO2: 23 mmol/L (ref 22–32)
Calcium: 8.7 mg/dL — ABNORMAL LOW (ref 8.9–10.3)
Chloride: 102 mmol/L (ref 98–111)
Creatinine, Ser: 1.08 mg/dL (ref 0.61–1.24)
GFR, Estimated: >60 mL/min (ref >60)
Glucose, Bld: 112 mg/dL — ABNORMAL HIGH (ref 70–99)
Potassium: 3.4 mmol/L — ABNORMAL LOW (ref 3.5–5.1)
Sodium: 136 mmol/L (ref 135–145)

## 2021-11-27 LAB — GLUCOSE, CAPILLARY
Glucose-Capillary: 151 mg/dL — ABNORMAL HIGH (ref 70–99)
Glucose-Capillary: 116 mg/dL — ABNORMAL HIGH (ref 70–99)
Glucose-Capillary: 135 mg/dL — ABNORMAL HIGH (ref 70–99)
Glucose-Capillary: 130 mg/dL — ABNORMAL HIGH (ref 70–99)
Glucose-Capillary: 111 mg/dL — ABNORMAL HIGH (ref 70–99)

## 2021-11-27 MED ORDER — METOLAZONE 5 MG PO TABS
5.0000 mg | ORAL_TABLET | Freq: Every day | ORAL | Status: DC
  Administered 2021-11-27: 5 mg via ORAL
  Filled 2021-11-27: qty 1

## 2021-11-27 MED ORDER — SODIUM CHLORIDE 0.9% FLUSH: 3.0000 mL | INTRAVENOUS | Status: DC | PRN

## 2021-11-27 MED ORDER — ~~LOC~~ CARDIAC SURGERY, PATIENT & FAMILY EDUCATION: Freq: Once | Status: AC

## 2021-11-27 MED ORDER — SODIUM CHLORIDE 0.9 % IV SOLN: 250.0000 mL | INTRAVENOUS | Status: DC | PRN

## 2021-11-27 MED ORDER — POTASSIUM CHLORIDE ER 10 MEQ PO TBCR
40.0000 meq | EXTENDED_RELEASE_TABLET | Freq: Every day | ORAL | Status: AC
  Administered 2021-11-27 – 2021-11-28 (×2): 40 meq via ORAL
  Filled 2021-11-27 (×3): qty 4

## 2021-11-27 MED ORDER — SODIUM CHLORIDE 0.9% FLUSH: 3.0000 mL | Freq: Two times a day (BID) | INTRAVENOUS | Status: DC

## 2021-11-27 MED ORDER — ORAL CARE MOUTH RINSE: 15.0000 mL | OROMUCOSAL | Status: DC | PRN

## 2021-11-27 NOTE — Progress Notes (Signed)
Seen Change my name on rounds. Patient doing well overall. Getting ready to mobilize. No focal neurologic symptoms. Seems to have tolerated his cardiac surgery well. I reviewed our plan with him, we will see him in the clinic in about six weeks for fall for valuation, and discussion of elective, prophylactic repair of severe, credit or recesses.  Kaycee

## 2021-11-27 NOTE — Progress Notes (Signed)
Pt to transfer out of intensive care today, no further critical care needs.  Discussed with Dr. Valeta Harms and PCCM will sign off, please re-engage for any pulmonary or critical care concerns.   Samuel Carpen Cuma Polyakov, PA-C [facility] See Amion for pager If no response to pager , please call [phone] until 7pm After 7:00 pm call Elink  [phone]

## 2021-11-27 NOTE — Progress Notes (Addendum)
      [address]       [place] [address]             [phone]      3 Days Post-Op Procedure(s) (LRB): CORONARY ARTERY BYPASS GRAFTING (CABG) X3 BYPASSES, USING OPEN LEFT INTERNAL MAMMARY ARTERY AND ENDOSCOPIC RIGHT GREATER SAPHENOUS VEIN HARVEST (N/A) TRANSESOPHAGEAL ECHOCARDIOGRAM (TEE) (N/A)  Subjective:  Patient up in chair.  Pain under better control.  + ambulation  + BM  Objective: Vital signs in last 24 hours: Temp:  [97.9 F (36.6 C)-98.7 F (37.1 C)] 98.5 F (36.9 C) (10/02 0700) Pulse Rate:  [66-87] 87 (10/02 0800) Cardiac Rhythm: Normal sinus rhythm (10/02 0715) Resp:  [10-25] 19 (10/02 0800) BP: (86-131)/(58-78) 124/62 (10/02 0800) SpO2:  [92 %-97 %] 94 % (10/02 0800) Weight:  [98.6 kg] 98.6 kg (10/02 0500)  Intake/Output from previous day: 10/01 0701 - 10/02 0700 In: 1110 [P.O.:1080; I.V.:30] Out: 1810 [Urine:1810] Intake/Output this shift: Total I/O In: 20 [P.O.:20] Out: -   General appearance: alert, cooperative, and no distress Heart: regular rate and rhythm Lungs: clear to auscultation bilaterally Abdomen: soft, non-tender; bowel sounds normal; no masses,  no organomegaly Extremities: edema trace Wound: clean and dry  Lab Results: Recent Labs    11/26/21 0402 11/27/21 0602  WBC 12.8* 10.8*  HGB 8.7* 9.0*  HCT 26.9* 26.7*  PLT 183 200   BMET:  Recent Labs    11/26/21 0402 11/27/21 0602  NA 135 136  K 4.1 3.4*  CL 104 102  CO2 24 23  GLUCOSE 146* 112*  BUN 14 21  CREATININE 1.03 1.08  CALCIUM 8.6* 8.7*    PT/INR:  Recent Labs    11/24/21 1259  LABPROT 15.0  INR 1.2   ABG    Component Value Date/Time   PHART 7.340 (L) 11/24/2021 1658   HCO3 23.3 11/24/2021 1658   TCO2 25 11/24/2021 1658   ACIDBASEDEF 3.0 (H) 11/24/2021 1658   O2SAT 87 11/24/2021 1658   CBG (last 3)  Recent Labs    11/26/21 1543 11/26/21 2145 11/27/21 0640  GLUCAP 156* 123* 116*    Assessment/Plan: S/P Procedure(s)  (LRB): CORONARY ARTERY BYPASS GRAFTING (CABG) X3 BYPASSES, USING OPEN LEFT INTERNAL MAMMARY ARTERY AND ENDOSCOPIC RIGHT GREATER SAPHENOUS VEIN HARVEST (N/A) TRANSESOPHAGEAL ECHOCARDIOGRAM (TEE) (N/A)  CV- NSR, Bp stable- continue Lopressor Pulm- wean oxygen as tolerated, continue IS Renal- creatinine remains normal at 1.08, weight is trending down, continue Lasix, potassium at 3.4, will increase supplement DM- cbgs improved, continue SSIP, Levemir, Metformin Expected post operative blood loss anemia, mild Hgb stable at 9.0 Dispo- patient stable, overall doing well, continue current care, will transfer to 4E   LOS: 5 days   Ellwood Handler, PA-C 11/27/2021 Patient examined and CXR today reviewed Making progress- needs more diuresis Transfer to 4 E today  patient examined and medical record reviewed,agree with above note. Dahlia Byes 11/27/2021

## 2021-11-27 NOTE — Progress Notes (Signed)
CARDIAC REHAB PHASE I   PRE:  Rate/Rhythm: 76 SR    BP: sitting 112/76    SaO2: 95 1L  MODE:  Ambulation: 370 ft   POST:  Rate/Rhythm: 102 ST with PAC/PVCs    BP: sitting 148/89     SaO2: 96 RA  Pt moved to EOB and stood with standby assist. Walked with RW, slow and steady with anterior lean. C/o fatigue and SOB with distance. Return to bed. SaO2 stable on RA however might need O2 in bed. Practicing IS. Encouraged another walk today. [id number]   Yves Dill BS, ACSM-CEP 11/27/2021 11:29 AM

## 2021-11-28 ENCOUNTER — Inpatient Hospital Stay (HOSPITAL_COMMUNITY): Payer: Medicare Other

## 2021-11-28 LAB — CBC
HCT: 28.1 % — ABNORMAL LOW (ref 39.0–52.0)
Hemoglobin: 9.4 g/dL — ABNORMAL LOW (ref 13.0–17.0)
MCH: 29.2 pg (ref 26.0–34.0)
MCHC: 33.5 g/dL (ref 30.0–36.0)
MCV: 87.3 fL (ref 80.0–100.0)
Platelets: 255 10*3/uL (ref 150–400)
RBC: 3.22 MIL/uL — ABNORMAL LOW (ref 4.22–5.81)
RDW: 15.5 % (ref 11.5–15.5)
WBC: 10.1 10*3/uL (ref 4.0–10.5)
nRBC: 0 % (ref 0.0–0.2)

## 2021-11-28 LAB — BASIC METABOLIC PANEL
Anion gap: 11 (ref 5–15)
BUN: 20 mg/dL (ref 8–23)
CO2: 24 mmol/L (ref 22–32)
Calcium: 9 mg/dL (ref 8.9–10.3)
Chloride: 98 mmol/L (ref 98–111)
Creatinine, Ser: 0.98 mg/dL (ref 0.61–1.24)
GFR, Estimated: >60 mL/min (ref >60)
Glucose, Bld: 115 mg/dL — ABNORMAL HIGH (ref 70–99)
Potassium: 3.7 mmol/L (ref 3.5–5.1)
Sodium: 133 mmol/L — ABNORMAL LOW (ref 135–145)

## 2021-11-28 LAB — GLUCOSE, CAPILLARY
Glucose-Capillary: 143 mg/dL — ABNORMAL HIGH (ref 70–99)
Glucose-Capillary: 109 mg/dL — ABNORMAL HIGH (ref 70–99)
Glucose-Capillary: 155 mg/dL — ABNORMAL HIGH (ref 70–99)
Glucose-Capillary: 124 mg/dL — ABNORMAL HIGH (ref 70–99)

## 2021-11-28 NOTE — Discharge Instructions (Signed)

## 2021-11-28 NOTE — Progress Notes (Signed)
Pt. Has his own cpap from home. No issues at this time.

## 2021-11-28 NOTE — Progress Notes (Signed)
Seen pt for ambulation + education. Pt declined ambulation due to walking earlier and feeling tired. Attempted to go over education but pt started falling asleep. Pt said wife will be coming shortly and should hear education too. Will f/u as time allows.  Christen Bame 11/28/2021 10:25 AM   [id number]

## 2021-11-28 NOTE — Progress Notes (Signed)
Pt was educated on sternal precautions, restrictions, heart healthy diet, exercise guidelines, IS,  and CRPII. Pt will be referred to CRPII at [facility].  Samuel [name] 11/28/2021 11:48 AM

## 2021-11-28 NOTE — Progress Notes (Addendum)
      [address]       [place] [address]             [phone]       4 Days Post-Op Procedure(s) (LRB): CORONARY ARTERY BYPASS GRAFTING (CABG) X3 BYPASSES, USING OPEN LEFT INTERNAL MAMMARY ARTERY AND ENDOSCOPIC RIGHT GREATER SAPHENOUS VEIN HARVEST (N/A) TRANSESOPHAGEAL ECHOCARDIOGRAM (TEE) (N/A)  Subjective:  Patient a little more fatigued this morning.  He has already ambulated around the unit.  Objective: Vital signs in last 24 hours: Temp:  [97.9 F (36.6 C)-98.9 F (37.2 C)] 98.5 F (36.9 C) (10/03 0400) Pulse Rate:  [71-87] 84 (10/03 0700) Cardiac Rhythm: Normal sinus rhythm (10/02 2000) Resp:  [10-28] 13 (10/03 0700) BP: (92-144)/(62-89) 120/68 (10/03 0700) SpO2:  [90 %-96 %] 93 % (10/03 0700) Weight:  [96.3 kg] 96.3 kg (10/03 0500)  Intake/Output from previous day: 10/02 0701 - 10/03 0700 In: 520 [P.O.:520] Out: 1650 [Urine:1650]  General appearance: alert, cooperative, and no distress Heart: regular rate and rhythm Lungs: diminished breath sounds left base Abdomen: soft, non-tender; bowel sounds normal; no masses,  no organomegaly Extremities: edema trace Wound: clean and dry  Lab Results: Recent Labs    11/27/21 0602 11/28/21 0305  WBC 10.8* 10.1  HGB 9.0* 9.4*  HCT 26.7* 28.1*  PLT 200 255   BMET:  Recent Labs    11/27/21 0602 11/28/21 0305  NA 136 133*  K 3.4* 3.7  CL 102 98  CO2 23 24  GLUCOSE 112* 115*  BUN 21 20  CREATININE 1.08 0.98  CALCIUM 8.7* 9.0    PT/INR: No results for input(s): "LABPROT", "INR" in the last 72 hours. ABG    Component Value Date/Time   PHART 7.340 (L) 11/24/2021 1658   HCO3 23.3 11/24/2021 1658   TCO2 25 11/24/2021 1658   ACIDBASEDEF 3.0 (H) 11/24/2021 1658   O2SAT 87 11/24/2021 1658   CBG (last 3)  Recent Labs    11/27/21 1610 11/27/21 2208 11/28/21 0653  GLUCAP 130* 111* 143*    Assessment/Plan:  CV- NSR with PVCs- continue Lopressor, BP is low at times to titrate to 25  mg BID... EPW Pulm- CXR with trace left pleural effusion, off oxygen, continue IS Renal- creatinine stable, weight is 1 lb above baseline, continue Lasix, potassium... stop Metolazone due to hyponatremia due to over diuresis DM- sugars controlled, insulin, metformin Dispo- patient stable, will d/c EPW today, continue Lasix, potassium, awaiting bed on 4E, possible for d/c in next 24-48 hours   LOS: 6 days    Ellwood Handler, PA-C 11/28/2021  Patient seen and examined, agree with above Awaiting bed on 4E Looks much better  Remo Lipps C. Roxan Hockey, MD [facility] [phone]

## 2021-11-29 LAB — BASIC METABOLIC PANEL
Anion gap: 13 (ref 5–15)
BUN: 21 mg/dL (ref 8–23)
CO2: 26 mmol/L (ref 22–32)
Calcium: 9.4 mg/dL (ref 8.9–10.3)
Chloride: 95 mmol/L — ABNORMAL LOW (ref 98–111)
Creatinine, Ser: 1.05 mg/dL (ref 0.61–1.24)
GFR, Estimated: >60 mL/min (ref >60)
Glucose, Bld: 134 mg/dL — ABNORMAL HIGH (ref 70–99)
Potassium: 3.9 mmol/L (ref 3.5–5.1)
Sodium: 134 mmol/L — ABNORMAL LOW (ref 135–145)

## 2021-11-29 LAB — GLUCOSE, CAPILLARY: Glucose-Capillary: 148 mg/dL — ABNORMAL HIGH (ref 70–99)

## 2021-11-29 MED ORDER — ACETAMINOPHEN 500 MG PO TABS: 500.0000 mg | ORAL_TABLET | Freq: Four times a day (QID) | ORAL | 0 refills | Status: AC | PRN

## 2021-11-29 MED ORDER — OXYCODONE HCL 5 MG PO TABS: 5.0000 mg | ORAL_TABLET | ORAL | 0 refills | Status: DC | PRN

## 2021-11-29 MED ORDER — FUROSEMIDE 40 MG PO TABS: 40.0000 mg | ORAL_TABLET | Freq: Every day | ORAL | 0 refills | Status: DC

## 2021-11-29 MED ORDER — METOPROLOL TARTRATE 25 MG PO TABS: 12.5000 mg | ORAL_TABLET | Freq: Two times a day (BID) | ORAL | 3 refills | Status: AC

## 2021-11-29 MED ORDER — CLOPIDOGREL BISULFATE 75 MG PO TABS: 75.0000 mg | ORAL_TABLET | Freq: Every day | ORAL | 11 refills | Status: AC

## 2021-11-29 MED ORDER — METFORMIN HCL 500 MG PO TABS: 500.0000 mg | ORAL_TABLET | Freq: Two times a day (BID) | ORAL | Status: AC

## 2021-11-29 MED ORDER — INFLUENZA VAC A&B SA ADJ QUAD 0.5 ML IM PRSY
0.5000 mL | PREFILLED_SYRINGE | Freq: Once | INTRAMUSCULAR | Status: AC
  Administered 2021-11-29: 0.5 mL via INTRAMUSCULAR
  Filled 2021-11-29: qty 0.5

## 2021-11-29 NOTE — Progress Notes (Signed)
CARDIAC REHAB PHASE I   PRE:  Rate/Rhythm: no monitor present  BP:  Sitting: 146/89   SaO2: wnr  MODE:  Ambulation: 470 ft   POST:  Rate/Rhythm: no monitor present  BP:  Sitting: 166/94   SaO2: wnr  Pt received in recliner, agreeable to ambulation. Pt needed verbal cues for hand placement following sternal precautions to standing. Pt ambulated in hallway with RW, tolerated well. Pt c/o of SOB at end of walk at a 8/10. After sitting in recliner SOB resolved to 1/10. All questions regarding education done yesterday from pt and family answered. Pt left in recliner with call bell in reach.  [place], MS 11/29/2021 8:56 AM

## 2021-11-29 NOTE — Care Management Important Message (Signed)
Important Message  Patient Details  Name: Samuel [name] MRN: [id number] Date of Birth: 29-Aug-1950   Medicare Important Message Given:  Yes     Shelda Altes 11/29/2021, 10:13 AM

## 2021-11-29 NOTE — Progress Notes (Signed)
      [address]       [place] [address]             [phone]      5 Days Post-Op Procedure(s) (LRB): CORONARY ARTERY BYPASS GRAFTING (CABG) X3 BYPASSES, USING OPEN LEFT INTERNAL MAMMARY ARTERY AND ENDOSCOPIC RIGHT GREATER SAPHENOUS VEIN HARVEST (N/A) TRANSESOPHAGEAL ECHOCARDIOGRAM (TEE) (N/A)  Subjective:  Patient feels much better this morning.  He has ambulated around the unit.  The patient feels up to going home.  Objective: Vital signs in last 24 hours: Temp:  [97.6 F (36.4 C)-98.7 F (37.1 C)] 98.4 F (36.9 C) (10/04 0605) Pulse Rate:  [77-92] 83 (10/03 2325) Cardiac Rhythm: Normal sinus rhythm;Bundle branch block (10/03 1900) Resp:  [10-24] 19 (10/04 0605) BP: (114-139)/(66-104) 139/77 (10/04 0605) SpO2:  [90 %-100 %] 100 % (10/04 0605) Weight:  [93.3 kg] 93.3 kg (10/04 0605)  Intake/Output from previous day: 10/03 0701 - 10/04 0700 In: 380 [P.O.:380] Out: -   General appearance: alert, cooperative, and no distress Heart: regular rate and rhythm Lungs: clear to auscultation bilaterally Abdomen: soft, non-tender; bowel sounds normal; no masses,  no organomegaly Extremities: edema trace Wound: clean and dry  Lab Results: Recent Labs    11/27/21 0602 11/28/21 0305  WBC 10.8* 10.1  HGB 9.0* 9.4*  HCT 26.7* 28.1*  PLT 200 255   BMET:  Recent Labs    11/28/21 0305 11/29/21 0126  NA 133* 134*  K 3.7 3.9  CL 98 95*  CO2 24 26  GLUCOSE 115* 134*  BUN 20 21  CREATININE 0.98 1.05  CALCIUM 9.0 9.4    PT/INR: No results for input(s): "LABPROT", "INR" in the last 72 hours. ABG    Component Value Date/Time   PHART 7.340 (L) 11/24/2021 1658   HCO3 23.3 11/24/2021 1658   TCO2 25 11/24/2021 1658   ACIDBASEDEF 3.0 (H) 11/24/2021 1658   O2SAT 87 11/24/2021 1658   CBG (last 3)  Recent Labs    11/28/21 1722 11/28/21 2104 11/29/21 0606  GLUCAP 155* 124* 148*    Assessment/Plan: S/P Procedure(s) (LRB): CORONARY ARTERY BYPASS  GRAFTING (CABG) X3 BYPASSES, USING OPEN LEFT INTERNAL MAMMARY ARTERY AND ENDOSCOPIC RIGHT GREATER SAPHENOUS VEIN HARVEST (N/A) TRANSESOPHAGEAL ECHOCARDIOGRAM (TEE) (N/A)  CV- NSR, PVCs stable- continue Lopressor, may need to titrate as BP allows Pulm- off oxygen, stable pleural effusions/atelectasis Renal- creatinine, continue lasix, potassium DM- sugars stable, continue home Metformin Dispo- patient stable, will d/c home today   LOS: 7 days    Ellwood Handler, PA-C 11/29/2021

## 2021-11-30 MED FILL — Electrolyte-R (PH 7.4) Solution: INTRAVENOUS | Qty: 4000 | Status: AC

## 2021-11-30 MED FILL — Lidocaine HCl Local Soln Prefilled Syringe 100 MG/5ML (2%): INTRAMUSCULAR | Qty: 5 | Status: AC

## 2021-11-30 MED FILL — Sodium Bicarbonate IV Soln 8.4%: INTRAVENOUS | Qty: 50 | Status: AC

## 2021-11-30 MED FILL — Heparin Sodium (Porcine) Inj 1000 Unit/ML: INTRAMUSCULAR | Qty: 20 | Status: AC

## 2021-11-30 MED FILL — Mannitol IV Soln 20%: INTRAVENOUS | Qty: 500 | Status: AC

## 2021-11-30 MED FILL — Sodium Chloride IV Soln 0.9%: INTRAVENOUS | Qty: 2000 | Status: AC

## 2021-11-30 MED FILL — Lidocaine HCl Local Preservative Free (PF) Inj 2%: INTRAMUSCULAR | Qty: 14 | Status: AC

## 2021-12-01 NOTE — Progress Notes (Unsigned)
[address]       [place] [address]             [phone]       HPI: Samuel [name] returns for routine postoperative follow-up having undergone CABG x 3 utilizing LIMA to LAD, RSVG to PDA, and RSVG to PDA on 11/24/21. The patient's early postoperative recovery while in the hospital was notable for mild hyponatremia and hypokalemia due to diuresis which was corrected before discharge. He also had a small left pleural effusion for which he was discharged with 1 week of '40mg'$  PO Lasix. He was discharged in stable condition on 11/29/21. Since hospital discharge the patient reports some shortness of breath with exertion and chest soreness that does not require pain medication. He is otherwise doing well. He denies dizziness or syncope. CXR today shows decreased trace bilateral pleural effusions and atelectasis.   Current Outpatient Medications  Medication Sig Dispense Refill   acetaminophen (TYLENOL) 500 MG tablet Take 1-2 tablets (500-1,000 mg total) by mouth every 6 (six) hours as needed. 30 tablet 0   aspirin 81 MG EC tablet Take 81 mg by mouth daily.      atorvastatin (LIPITOR) 80 MG tablet Take 1 tablet (80 mg total) by mouth daily. 30 tablet 0   clopidogrel (PLAVIX) 75 MG tablet Take 1 tablet (75 mg total) by mouth daily. 30 tablet 11   fluticasone (FLONASE) 50 MCG/ACT nasal spray Place 2 sprays into both nostrils as needed for allergies or rhinitis.     furosemide (LASIX) 40 MG tablet Take 1 tablet (40 mg total) by mouth daily. 7 tablet 0   loratadine (CLARITIN) 10 MG tablet Take 10 mg by mouth daily.     metFORMIN (GLUCOPHAGE) 500 MG tablet Take 1 tablet (500 mg total) by mouth 2 (two) times daily with a meal.     metoprolol tartrate (LOPRESSOR) 25 MG tablet Take 0.5 tablets (12.5 mg total) by mouth 2 (two) times daily. 60 tablet 3   nitroGLYCERIN (NITROSTAT) 0.4 MG SL tablet Place under the tongue.     omeprazole (PRILOSEC) 20 MG capsule Take 20 mg by mouth daily  before breakfast.     oxybutynin (DITROPAN-XL) 5 MG 24 hr tablet Take 5 mg by mouth daily.     oxyCODONE (OXY IR/ROXICODONE) 5 MG immediate release tablet Take 1 tablet (5 mg total) by mouth every 4 (four) hours as needed for severe pain. 30 tablet 0   No current facility-administered medications for this visit.  Vitals: Today's Vitals   12/05/21 1410  BP: 117/72  Pulse: 89  Resp: 20  SpO2: 96%  Weight: 202 lb 12.8 oz (92 kg)  Height: '5\' 7"'$  (1.702 m)   Body mass index is 31.76 kg/m.   Physical Exam: Gen: Alert, no distress Neuro: grossly intact  CV: Regular rate and rhythm, no murmur, rub or gallop Pulm: slightly diminished at bases, otherwise clear to auscultation GI: normal bowel sounds, nontender, nondistended Extremities: trace edema Wounds: clean, dry, intact, healing well  Diagnostic Tests:  CLINICAL DATA:  Status post CABG 11/24/2021, dyspnea   EXAM: CHEST - 2 VIEW   COMPARISON:  11/28/2021 chest radiograph.   FINDINGS: Intact sternotomy wires. CABG clips overlie the left mediastinum. Stable cardiomediastinal silhouette with normal heart size. No pneumothorax. Trace bilateral pleural effusions, decreased bilaterally. No pulmonary edema. Mild streaky bibasilar atelectasis, mildly decreased bilaterally.   IMPRESSION: 1. Trace bilateral pleural effusions, decreased bilaterally. 2. Mild streaky bibasilar atelectasis,  mildly decreased bilaterally.     Electronically Signed   By: Ilona Sorrel M.D.   On: 12/05/2021 14:00  Impression/Plan:  S/P CABG x 3: Mr. Kwasnik received a CABG x 3 on 09/29, he is progressing very well. He admits to some shortness of breath with exertion and chest soreness but otherwise has no complaints including dizziness, syncope or shortness of breath at rest. He states he has been walking a lot lately. I encouraged him to continue strict sternal precautions but to continue ambulation. His wounds are healing well with no signs of infection.  Plan to follow up with Dr. Lavonna Monarch with CXR in 3 weeks. Will likely refer to cardiac rehab at that time.  Bilateral pleural effusions: CXR shows diminished trace bilateral pleural effusions, on physical exam there is trace lower extremity edema. He notes some shortness of breath with exertion. Will plan to continue lasix and potassium supplement for 5 more days.   Hypertension: Blood pressure well controlled, 117/72 today. Will continue current medication regimen. He states he has cardiology follow up scheduled within the next couple of weeks.  Magdalene River, PA-C [facility] [phone]

## 2021-12-04 ENCOUNTER — Other Ambulatory Visit: Payer: Self-pay | Admitting: Thoracic Surgery (Cardiothoracic Vascular Surgery)

## 2021-12-04 DIAGNOSIS — Z951 Presence of aortocoronary bypass graft: Secondary | ICD-10-CM

## 2021-12-05 ENCOUNTER — Ambulatory Visit (INDEPENDENT_AMBULATORY_CARE_PROVIDER_SITE_OTHER): Payer: Self-pay | Admitting: Physician Assistant

## 2021-12-05 ENCOUNTER — Ambulatory Visit
Admission: RE | Admit: 2021-12-05 | Discharge: 2021-12-05 | Disposition: A | Discharge status: Home / Self Care | Payer: Medicare Other | Source: Ambulatory Visit | Attending: Thoracic Surgery (Cardiothoracic Vascular Surgery) | Admitting: Thoracic Surgery (Cardiothoracic Vascular Surgery)

## 2021-12-05 VITALS — BP 117/72 | HR 89 | Resp 20 | Ht 67.0 in | Wt 202.8 lb

## 2021-12-05 DIAGNOSIS — Z951 Presence of aortocoronary bypass graft: Secondary | ICD-10-CM

## 2021-12-05 MED ORDER — POTASSIUM CHLORIDE ER 10 MEQ PO TBCR: 10.0000 meq | EXTENDED_RELEASE_TABLET | Freq: Two times a day (BID) | ORAL | 0 refills | Status: DC

## 2021-12-05 MED ORDER — FUROSEMIDE 40 MG PO TABS: 40.0000 mg | ORAL_TABLET | Freq: Every day | ORAL | 0 refills | Status: DC

## 2021-12-05 NOTE — Patient Instructions (Signed)
Continue to avoid any heavy lifting or strenuous use of your arms or shoulders for at least a total of three months from the time of surgery.  After three months you may gradually increase how much you lift or otherwise use your arms or chest as tolerated, with limits based upon whether or not activities lead to the return of significant discomfort.  

## 2021-12-12 DIAGNOSIS — I25810 Atherosclerosis of coronary artery bypass graft(s) without angina pectoris: Secondary | ICD-10-CM | POA: Insufficient documentation

## 2021-12-14 ENCOUNTER — Encounter: Payer: Medicare Other | Attending: Cardiology | Admitting: *Deleted

## 2021-12-14 ENCOUNTER — Encounter: Payer: Self-pay | Admitting: *Deleted

## 2021-12-14 DIAGNOSIS — I214 Non-ST elevation (NSTEMI) myocardial infarction: Secondary | ICD-10-CM | POA: Insufficient documentation

## 2021-12-14 DIAGNOSIS — Z951 Presence of aortocoronary bypass graft: Secondary | ICD-10-CM | POA: Insufficient documentation

## 2021-12-14 NOTE — Progress Notes (Signed)
Virtual orientation call completed today. he has an appointment on Date: 12/21/2021  for EP eval and gym Orientation.  Documentation of diagnosis can be found in [facility] Date: 11/22/2021 .

## 2021-12-21 VITALS — Ht 67.5 in | Wt 203.1 lb

## 2021-12-21 DIAGNOSIS — I214 Non-ST elevation (NSTEMI) myocardial infarction: Secondary | ICD-10-CM

## 2021-12-21 DIAGNOSIS — Z951 Presence of aortocoronary bypass graft: Secondary | ICD-10-CM

## 2021-12-21 NOTE — Progress Notes (Signed)
Cardiac Individual Treatment Plan  Patient Details  Name: Samuel [name] MRN: [id number] Date of Birth: [birth year] Referring Provider:   Flowsheet Row Cardiac Rehab from 12/21/2021 in [facility] Cardiac and Pulmonary Rehab  Referring Provider Isaias Cowman, MD       Initial Encounter Date:  Flowsheet Row Cardiac Rehab from 12/21/2021 in [facility] Cardiac and Pulmonary Rehab  Date 12/21/21       Visit Diagnosis: NSTEMI (non-ST elevation myocardial infarction) ([place])  S/P CABG x 3  Patient's Home Medications on Admission:  Current Outpatient Medications:    acetaminophen (TYLENOL) 500 MG tablet, Take 1-2 tablets (500-1,000 mg total) by mouth every 6 (six) hours as needed., Disp: 30 tablet, Rfl: 0   aspirin 81 MG EC tablet, Take 81 mg by mouth daily. , Disp: , Rfl:    atorvastatin (LIPITOR) 80 MG tablet, Take 1 tablet (80 mg total) by mouth daily., Disp: 30 tablet, Rfl: 0   clopidogrel (PLAVIX) 75 MG tablet, Take 1 tablet (75 mg total) by mouth daily., Disp: 30 tablet, Rfl: 11   fluticasone (FLONASE) 50 MCG/ACT nasal spray, Place 2 sprays into both nostrils as needed for allergies or rhinitis., Disp: , Rfl:    furosemide (LASIX) 40 MG tablet, Take 1 tablet (40 mg total) by mouth daily. (Patient not taking: Reported on 12/14/2021), Disp: 7 tablet, Rfl: 0   furosemide (LASIX) 40 MG tablet, Take 1 tablet (40 mg total) by mouth daily for 5 doses., Disp: 5 tablet, Rfl: 0   loratadine (CLARITIN) 10 MG tablet, Take 10 mg by mouth daily., Disp: , Rfl:    metFORMIN (GLUCOPHAGE) 500 MG tablet, Take 1 tablet (500 mg total) by mouth 2 (two) times daily with a meal., Disp: , Rfl:    metoprolol tartrate (LOPRESSOR) 25 MG tablet, Take 0.5 tablets (12.5 mg total) by mouth 2 (two) times daily., Disp: 60 tablet, Rfl: 3   nitroGLYCERIN (NITROSTAT) 0.4 MG SL tablet, Place under the tongue., Disp: , Rfl:    omeprazole (PRILOSEC) 20 MG capsule, Take 20 mg by mouth daily before breakfast., Disp: , Rfl:     oxybutynin (DITROPAN-XL) 5 MG 24 hr tablet, Take 5 mg by mouth daily., Disp: , Rfl:    potassium chloride (KLOR-CON) 10 MEQ tablet, Take 1 tablet (10 mEq total) by mouth 2 (two) times daily. (Patient not taking: Reported on 12/14/2021), Disp: 10 tablet, Rfl: 0  Past Medical History: Past Medical History:  Diagnosis Date   Anxiety    situational   Arthritis    Cancer ([place])    squamous cell carcinoma   ED (erectile dysfunction)    GERD (gastroesophageal reflux disease)    Hyperlipidemia    Hypertension    OSA (obstructive sleep apnea)    CPAP   Stroke ([place])    no residual    Uncontrolled type 2 diabetes mellitus with hyperglycemia, with long-term current use of insulin (HCC)     Tobacco Use: Social History   Tobacco Use  Smoking Status Never  Smokeless Tobacco Never    Labs: Review Flowsheet  More data may exist      Latest Ref Rng & Units 03/18/2019 11/03/2019 11/21/2021 11/23/2021 11/24/2021  Labs for ITP Cardiac and Pulmonary Rehab  Cholestrol 0 - 200 mg/dL - - 174  - -  LDL (calc) 0 - 99 mg/dL - - 108  - -  HDL-C >40 mg/dL - - 27  - -  Trlycerides <150 mg/dL - - 197  - -  Hemoglobin A1c  4.8 - 5.6 % 6.9  6.6  7.3  - -  PH, Arterial 7.35 - 7.45 - - - 7.46  7.340  7.375  7.306  7.276  7.317  7.383  7.334  7.324   PCO2 arterial 32 - 48 mmHg - - - 32  43.0  35.1  43.0  48.3  47.5  39.7  49.0  41.7   Bicarbonate 20.0 - 28.0 mmol/L - - - 22.8  23.3  20.7  21.7  22.7  24.3  23.6  24.9  26.1  21.7   TCO2 22 - 32 mmol/L - - - - '25  22  23  24  24  26  24  25  24  26  28  23  26  23   '$ Acid-base deficit 0.0 - 2.0 mmol/L - - - 0.3  3.0  4.0  5.0  4.0  2.0  1.0  1.0  4.0   O2 Saturation % - - - 98.1  87  98  99  98  100  100  79  100  99      Exercise Target Goals: Exercise Program Goal: Individual exercise prescription set using results from initial 6 min walk test and THRR while considering  patient's activity barriers and safety.   Exercise Prescription Goal: Initial exercise  prescription builds to 30-45 minutes a day of aerobic activity, 2-3 days per week.  Home exercise guidelines will be given to patient during program as part of exercise prescription that the participant will acknowledge.   Education: Aerobic Exercise: - Group verbal and visual presentation on the components of exercise prescription. Introduces F.I.T.T principle from ACSM for exercise prescriptions.  Reviews F.I.T.T. principles of aerobic exercise including progression. Written material given at graduation.   Education: Resistance Exercise: - Group verbal and visual presentation on the components of exercise prescription. Introduces F.I.T.T principle from ACSM for exercise prescriptions  Reviews F.I.T.T. principles of resistance exercise including progression. Written material given at graduation.    Education: Exercise & Equipment Safety: - Individual verbal instruction and demonstration of equipment use and safety with use of the equipment.   Education: Exercise Physiology & General Exercise Guidelines: - Group verbal and written instruction with models to review the exercise physiology of the cardiovascular system and associated critical values. Provides general exercise guidelines with specific guidelines to those with heart or lung disease.    Education: Flexibility, Balance, Mind/Body Relaxation: - Group verbal and visual presentation with interactive activity on the components of exercise prescription. Introduces F.I.T.T principle from ACSM for exercise prescriptions. Reviews F.I.T.T. principles of flexibility and balance exercise training including progression. Also discusses the mind body connection.  Reviews various relaxation techniques to help reduce and manage stress (i.e. Deep breathing, progressive muscle relaxation, and visualization). Balance handout provided to take home. Written material given at graduation.   Activity Barriers & Risk Stratification:  Activity Barriers &  Cardiac Risk Stratification - 12/21/21 1253       Activity Barriers & Cardiac Risk Stratification   Activity Barriers Left Knee Replacement;Right Knee Replacement;Arthritis   arthritis in both arms limits ROM   Cardiac Risk Stratification High             6 Minute Walk:  6 Minute Walk     Row Name 12/21/21 1252         6 Minute Walk   Phase Initial     Distance 1235 feet     Walk Time 6 minutes     #  of Rest Breaks 0     MPH 2.34     METS 2.49     RPE 11     Perceived Dyspnea  2     VO2 Peak 8.71     Symptoms Yes (comment)     Comments SOB     Resting HR 64 bpm     Resting BP 114/66     Resting Oxygen Saturation  98 %     Exercise Oxygen Saturation  during 6 min walk 97 %     Max Ex. HR 104 bpm     Max Ex. BP 130/80     2 Minute Post BP 122/76              Oxygen Initial Assessment:   Oxygen Re-Evaluation:   Oxygen Discharge (Final Oxygen Re-Evaluation):   Initial Exercise Prescription:  Initial Exercise Prescription - 12/21/21 1300       Date of Initial Exercise RX and Referring Provider   Date 12/21/21    Referring Provider Isaias Cowman, MD      Oxygen   Maintain Oxygen Saturation 88% or higher      Treadmill   MPH 2    Grade 0.5    Minutes 15    METs 2.67      NuStep   Level 2    SPM 80    Minutes 15    METs 2.49      REL-XR   Level 1    Speed 50    Minutes 15    METs 2.49      Prescription Details   Frequency (times per week) 3    Duration Progress to 30 minutes of continuous aerobic without signs/symptoms of physical distress      Intensity   THRR 40-80% of Max Heartrate 98-131    Ratings of Perceived Exertion 11-13    Perceived Dyspnea 0-4      Progression   Progression Continue to progress workloads to maintain intensity without signs/symptoms of physical distress.      Resistance Training   Training Prescription Yes    Weight 3 lb    Reps 10-15             Perform Capillary Blood Glucose checks  as needed.  Exercise Prescription Changes:   Exercise Prescription Changes     Row Name 12/21/21 1300             Response to Exercise   Blood Pressure (Admit) 114/66       Blood Pressure (Exercise) 130/80       Blood Pressure (Exit) 122/76       Heart Rate (Admit) 64 bpm       Heart Rate (Exercise) 104 bpm       Heart Rate (Exit) 67 bpm       Oxygen Saturation (Admit) 98 %       Oxygen Saturation (Exercise) 97 %       Rating of Perceived Exertion (Exercise) 11       Perceived Dyspnea (Exercise) 2       Symptoms SOB       Comments 6MWT Results                Exercise Comments:   Exercise Goals and Review:   Exercise Goals     Row Name 12/21/21 1305             Exercise Goals   Increase Physical Activity Yes  Intervention Provide advice, education, support and counseling about physical activity/exercise needs.;Develop an individualized exercise prescription for aerobic and resistive training based on initial evaluation findings, risk stratification, comorbidities and participant's personal goals.       Expected Outcomes Short Term: Attend rehab on a regular basis to increase amount of physical activity.;Long Term: Exercising regularly at least 3-5 days a week.;Long Term: Add in home exercise to make exercise part of routine and to increase amount of physical activity.       Increase Strength and Stamina Yes       Intervention Provide advice, education, support and counseling about physical activity/exercise needs.;Develop an individualized exercise prescription for aerobic and resistive training based on initial evaluation findings, risk stratification, comorbidities and participant's personal goals.       Expected Outcomes Short Term: Increase workloads from initial exercise prescription for resistance, speed, and METs.;Short Term: Perform resistance training exercises routinely during rehab and add in resistance training at home;Long Term: Improve  cardiorespiratory fitness, muscular endurance and strength as measured by increased METs and functional capacity (6MWT)       Able to understand and use rate of perceived exertion (RPE) scale Yes       Intervention Provide education and explanation on how to use RPE scale       Expected Outcomes Short Term: Able to use RPE daily in rehab to express subjective intensity level;Long Term:  Able to use RPE to guide intensity level when exercising independently       Able to understand and use Dyspnea scale Yes       Intervention Provide education and explanation on how to use Dyspnea scale       Expected Outcomes Long Term: Able to use Dyspnea scale to guide intensity level when exercising independently;Short Term: Able to use Dyspnea scale daily in rehab to express subjective sense of shortness of breath during exertion       Knowledge and understanding of Target Heart Rate Range (THRR) Yes       Intervention Provide education and explanation of THRR including how the numbers were predicted and where they are located for reference       Expected Outcomes Short Term: Able to state/look up THRR;Long Term: Able to use THRR to govern intensity when exercising independently;Short Term: Able to use daily as guideline for intensity in rehab       Able to check pulse independently Yes       Intervention Provide education and demonstration on how to check pulse in carotid and radial arteries.;Review the importance of being able to check your own pulse for safety during independent exercise       Expected Outcomes Short Term: Able to explain why pulse checking is important during independent exercise;Long Term: Able to check pulse independently and accurately       Understanding of Exercise Prescription Yes       Intervention Provide education, explanation, and written materials on patient's individual exercise prescription       Expected Outcomes Short Term: Able to explain program exercise prescription;Long  Term: Able to explain home exercise prescription to exercise independently                Exercise Goals Re-Evaluation :   Discharge Exercise Prescription (Final Exercise Prescription Changes):  Exercise Prescription Changes - 12/21/21 1300       Response to Exercise   Blood Pressure (Admit) 114/66    Blood Pressure (Exercise) 130/80    Blood Pressure (  Exit) 122/76    Heart Rate (Admit) 64 bpm    Heart Rate (Exercise) 104 bpm    Heart Rate (Exit) 67 bpm    Oxygen Saturation (Admit) 98 %    Oxygen Saturation (Exercise) 97 %    Rating of Perceived Exertion (Exercise) 11    Perceived Dyspnea (Exercise) 2    Symptoms SOB    Comments 6MWT Results             Nutrition:  Target Goals: Understanding of nutrition guidelines, daily intake of sodium '1500mg'$ , cholesterol '200mg'$ , calories 30% from fat and 7% or less from saturated fats, daily to have 5 or more servings of fruits and vegetables.  Education: All About Nutrition: -Group instruction provided by verbal, written material, interactive activities, discussions, models, and posters to present general guidelines for heart healthy nutrition including fat, fiber, MyPlate, the role of sodium in heart healthy nutrition, utilization of the nutrition label, and utilization of this knowledge for meal planning. Follow up email sent as well. Written material given at graduation.   Biometrics:  Pre Biometrics - 12/21/21 1306       Pre Biometrics   Height 5' 7.5" (1.715 m)    Weight 203 lb 1.6 oz (92.1 kg)    Waist Circumference 43 inches    Hip Circumference 42.5 inches    Waist to Hip Ratio 1.01 %    BMI (Calculated) 31.32    Single Leg Stand 7.5 seconds   R             Nutrition Therapy Plan and Nutrition Goals:  Nutrition Therapy & Goals - 12/21/21 0914       Nutrition Therapy   Diet Heart healthy, low Na, T2DM MNT    Drug/Food Interactions Statins/Certain Fruits    Protein (specify units) 85-95g    Fiber 31  grams    Whole Grain Foods 3 servings    Saturated Fats 17 max. grams    Fruits and Vegetables 8 servings/day    Sodium 2 grams      Personal Nutrition Goals   Nutrition Goal ST: practice adding volume, fiber, protein, and healthy fats to meals with MyPlate as well as review paperwork LT: achieve and maintain A1C <7, at least 31g of fiber/day, <17g saturated fat/day, follow MyPlate guidelines    Comments 71 y.o. M admitted to cardiac rehab s/p NSTEMI and CABG x3. PMHx includes HTN, HLD, T2DM, stg 3a CKD, OSA, RBBB, GERD. PSHx Left and right TKA 2021. Relevant medications includes lipitor, furosemide, metformin, omeprazole. [place] and [name] reports making changes recently to both of their diets after his diabetes diagnosis and after speaking with the doctor about his heart health. Food recall: Coffee (zero sugar creamer) B: 1 glass of 1% milk or tomato juice with breakfast. 1 egg from 2 eggs with 1.5 pieces of sausage or bacon and grits (no longer putting sugar in it) or cheerios (honey nut) or oatmeal with some butter and honey L: sandwich (whole wheat) with ham and cheese with olive oil and vinegar dressing and mustard or light mayo D: vaires: wife will cook dinner. Lean ground [place] with spaghetti (they have switched to ground [place] and chicken mostly for protein - grilling or baking normally). he likes pinto beans, peas, carrots, turnip greens, bell peppers, and onions. Drinks: lemonade (crystal lite), sometimes small coke zero. Occassionally will have a beer or 2. Discussed heart healthy eating and MyPlate guidelines as well as considerations for BG management and T2DM.  his wife is interested in mixing cauliflower rice with regular rice and trying lentils.      Intervention Plan   Intervention Prescribe, educate and counsel regarding individualized specific dietary modifications aiming towards targeted core components such as weight, hypertension, lipid management, diabetes, heart failure  and other comorbidities.;Nutrition handout(s) given to patient.    Expected Outcomes Short Term Goal: Understand basic principles of dietary content, such as calories, fat, sodium, cholesterol and nutrients.;Short Term Goal: A plan has been developed with personal nutrition goals set during dietitian appointment.;Long Term Goal: Adherence to prescribed nutrition plan.             Nutrition Assessments:  MEDIFICTS Score Key: ?70 Need to make dietary changes  40-70 Heart Healthy Diet ? 40 Therapeutic Level Cholesterol Diet   Picture Your Plate Scores: <02 Unhealthy dietary pattern with much room for improvement. 41-50 Dietary pattern unlikely to meet recommendations for good health and room for improvement. 51-60 More healthful dietary pattern, with some room for improvement.  >60 Healthy dietary pattern, although there may be some specific behaviors that could be improved.    Nutrition Goals Re-Evaluation:   Nutrition Goals Discharge (Final Nutrition Goals Re-Evaluation):   Psychosocial: Target Goals: Acknowledge presence or absence of significant depression and/or stress, maximize coping skills, provide positive support system. Participant is able to verbalize types and ability to use techniques and skills needed for reducing stress and depression.   Education: Stress, Anxiety, and Depression - Group verbal and visual presentation to define topics covered.  Reviews how body is impacted by stress, anxiety, and depression.  Also discusses healthy ways to reduce stress and to treat/manage anxiety and depression.  Written material given at graduation.   Education: Sleep Hygiene -Provides group verbal and written instruction about how sleep can affect your health.  Define sleep hygiene, discuss sleep cycles and impact of sleep habits. Review good sleep hygiene tips.    Initial Review & Psychosocial Screening:  Initial Psych Review & Screening - 12/14/21 1342       Initial  Review   Current issues with None Identified      Family Dynamics   Good Support System? Yes   wife  married 48 years     Barriers   Psychosocial barriers to participate in program There are no identifiable barriers or psychosocial needs.      Screening Interventions   Interventions Encouraged to exercise;To provide support and resources with identified psychosocial needs;Provide feedback about the scores to participant    Expected Outcomes Short Term goal: Utilizing psychosocial counselor, staff and physician to assist with identification of specific Stressors or current issues interfering with healing process. Setting desired goal for each stressor or current issue identified.;Long Term Goal: Stressors or current issues are controlled or eliminated.;Short Term goal: Identification and review with participant of any Quality of Life or Depression concerns found by scoring the questionnaire.;Long Term goal: The participant improves quality of Life and PHQ9 Scores as seen by post scores and/or verbalization of changes             Quality of Life Scores:   Scores of 19 and below usually indicate a poorer quality of life in these areas.  A difference of  2-3 points is a clinically meaningful difference.  A difference of 2-3 points in the total score of the Quality of Life Index has been associated with significant improvement in overall quality of life, self-image, physical symptoms, and general health in studies assessing change in quality  of life.  PHQ-9: Review Flowsheet       12/21/2021 02/07/2015  Depression screen PHQ 2/9  Decreased Interest 0 0  Down, Depressed, Hopeless 0 0  PHQ - 2 Score 0 0  Altered sleeping 0 -  Tired, decreased energy 1 -  Change in appetite 0 -  Feeling bad or failure about yourself  0 -  Trouble concentrating 0 -  Moving slowly or fidgety/restless 0 -  Suicidal thoughts 0 -  PHQ-9 Score 1 -  Difficult doing work/chores Somewhat difficult -    Interpretation of Total Score  Total Score Depression Severity:  1-4 = Minimal depression, 5-9 = Mild depression, 10-14 = Moderate depression, 15-19 = Moderately severe depression, 20-27 = Severe depression   Psychosocial Evaluation and Intervention:  Psychosocial Evaluation - 12/14/21 1350       Psychosocial Evaluation & Interventions   Interventions Encouraged to exercise with the program and follow exercise prescription    Comments Hiram has no barriers to attending the program. He lives with his wife of 73 years. She is his support. He wants to get back to normal. He does play golf. He is ready to start the program    Expected Outcomes STG Jerold attends all scheduled sessions, he is able to progress his exercise to the point he can get back to his golf game. LTG Muhammad continues to work on his exercise progression after discharge.    Continue Psychosocial Services  Follow up required by staff             Psychosocial Re-Evaluation:   Psychosocial Discharge (Final Psychosocial Re-Evaluation):   Vocational Rehabilitation: Provide vocational rehab assistance to qualifying candidates.   Vocational Rehab Evaluation & Intervention:   Education: Education Goals: Education classes will be provided on a variety of topics geared toward better understanding of heart health and risk factor modification. Participant will state understanding/return demonstration of topics presented as noted by education test scores.  Learning Barriers/Preferences:  Learning Barriers/Preferences - 12/14/21 1343       Learning Barriers/Preferences   Learning Barriers None    Learning Preferences None             General Cardiac Education Topics:  AED/CPR: - Group verbal and written instruction with the use of models to demonstrate the basic use of the AED with the basic ABC's of resuscitation.   Anatomy and Cardiac Procedures: - Group verbal and visual presentation and models provide  information about basic cardiac anatomy and function. Reviews the testing methods done to diagnose heart disease and the outcomes of the test results. Describes the treatment choices: Medical Management, Angioplasty, or Coronary Bypass Surgery for treating various heart conditions including Myocardial Infarction, Angina, Valve Disease, and Cardiac Arrhythmias.  Written material given at graduation.   Medication Safety: - Group verbal and visual instruction to review commonly prescribed medications for heart and lung disease. Reviews the medication, class of the drug, and side effects. Includes the steps to properly store meds and maintain the prescription regimen.  Written material given at graduation.   Intimacy: - Group verbal instruction through game format to discuss how heart and lung disease can affect sexual intimacy. Written material given at graduation..   Know Your Numbers and Heart Failure: - Group verbal and visual instruction to discuss disease risk factors for cardiac and pulmonary disease and treatment options.  Reviews associated critical values for Overweight/Obesity, Hypertension, Cholesterol, and Diabetes.  Discusses basics of heart failure: signs/symptoms and treatments.  Introduces Heart  Failure Zone chart for action plan for heart failure.  Written material given at graduation.   Infection Prevention: - Provides verbal and written material to individual with discussion of infection control including proper hand washing and proper equipment cleaning during exercise session.   Falls Prevention: - Provides verbal and written material to individual with discussion of falls prevention and safety. Flowsheet Row Cardiac Rehab from 12/14/2021 in [facility] Cardiac and Pulmonary Rehab  Date 12/14/21  Educator SB  Instruction Review Code 1- Verbalizes Understanding       Other: -Provides group and verbal instruction on various topics (see comments)   Knowledge Questionnaire  Score:   Core Components/Risk Factors/Patient Goals at Admission:  Personal Goals and Risk Factors at Admission - 12/21/21 1306       Core Components/Risk Factors/Patient Goals on Admission    Weight Management Yes    Intervention Weight Management: Develop a combined nutrition and exercise program designed to reach desired caloric intake, while maintaining appropriate intake of nutrient and fiber, sodium and fats, and appropriate energy expenditure required for the weight goal.;Weight Management: Provide education and appropriate resources to help participant work on and attain dietary goals.;Weight Management/Obesity: Establish reasonable short term and long term weight goals.    Admit Weight 203 lb 1.6 oz (92.1 kg)    Goal Weight: Short Term 196 lb (88.9 kg)    Goal Weight: Long Term 180 lb (81.6 kg)    Expected Outcomes Short Term: Continue to assess and modify interventions until short term weight is achieved;Long Term: Adherence to nutrition and physical activity/exercise program aimed toward attainment of established weight goal;Weight Loss: Understanding of general recommendations for a balanced deficit meal plan, which promotes 1-2 lb weight loss per week and includes a negative energy balance of [phone] kcal/d    Diabetes Yes    Intervention Provide education about signs/symptoms and action to take for hypo/hyperglycemia.;Provide education about proper nutrition, including hydration, and aerobic/resistive exercise prescription along with prescribed medications to achieve blood glucose in normal ranges: Fasting glucose 65-99 mg/dL    Expected Outcomes Short Term: Participant verbalizes understanding of the signs/symptoms and immediate care of hyper/hypoglycemia, proper foot care and importance of medication, aerobic/resistive exercise and nutrition plan for blood glucose control.;Long Term: Attainment of HbA1C < 7%.    Hypertension Yes    Intervention Provide education on lifestyle  modifcations including regular physical activity/exercise, weight management, moderate sodium restriction and increased consumption of fresh fruit, vegetables, and low fat dairy, alcohol moderation, and smoking cessation.;Monitor prescription use compliance.    Expected Outcomes Short Term: Continued assessment and intervention until BP is < 140/72m HG in hypertensive participants. < 130/861mHG in hypertensive participants with diabetes, heart failure or chronic kidney disease.;Long Term: Maintenance of blood pressure at goal levels.    Lipids Yes    Intervention Provide education and support for participant on nutrition & aerobic/resistive exercise along with prescribed medications to achieve LDL '70mg'$ , HDL >'40mg'$ .    Expected Outcomes Short Term: Participant states understanding of desired cholesterol values and is compliant with medications prescribed. Participant is following exercise prescription and nutrition guidelines.;Long Term: Cholesterol controlled with medications as prescribed, with individualized exercise RX and with personalized nutrition plan. Value goals: LDL < '70mg'$ , HDL > 40 mg.             Education:Diabetes - Individual verbal and written instruction to review signs/symptoms of diabetes, desired ranges of glucose level fasting, after meals and with exercise. Acknowledge that pre and post exercise glucose  checks will be done for 3 sessions at entry of program.   Core Components/Risk Factors/Patient Goals Review:    Core Components/Risk Factors/Patient Goals at Discharge (Final Review):    ITP Comments:  ITP Comments     Row Name 12/14/21 1354 12/21/21 1246         ITP Comments Virtual orientation call completed today. he has an appointment on Date: 12/21/2021  for EP eval and gym Orientation.  Documentation of diagnosis can be found in [facility] Date: 11/22/2021 . Completed 6MWT and gym orientation. Initial ITP created and sent for review to Dr Ramonita Lab, Medical  Director.               Comments: Initial ITP

## 2021-12-21 NOTE — Patient Instructions (Signed)
Patient Instructions  Patient Details  Name: Samuel [name] MRN: [id number] Date of Birth: 19-Jun-1950 Referring Provider:  Isaias Cowman, MD  Below are your personal goals for exercise, nutrition, and risk factors. Our goal is to help you stay on track towards obtaining and maintaining these goals. We will be discussing your progress on these goals with you throughout the program.  Initial Exercise Prescription:  Initial Exercise Prescription - 12/21/21 1300       Date of Initial Exercise RX and Referring Provider   Date 12/21/21    Referring Provider Isaias Cowman, MD      Oxygen   Maintain Oxygen Saturation 88% or higher      Treadmill   MPH 2    Grade 0.5    Minutes 15    METs 2.67      NuStep   Level 2    SPM 80    Minutes 15    METs 2.49      REL-XR   Level 1    Speed 50    Minutes 15    METs 2.49      Prescription Details   Frequency (times per week) 3    Duration Progress to 30 minutes of continuous aerobic without signs/symptoms of physical distress      Intensity   THRR 40-80% of Max Heartrate 98-131    Ratings of Perceived Exertion 11-13    Perceived Dyspnea 0-4      Progression   Progression Continue to progress workloads to maintain intensity without signs/symptoms of physical distress.      Resistance Training   Training Prescription Yes    Weight 3 lb    Reps 10-15             Exercise Goals: Frequency: Be able to perform aerobic exercise two to three times per week in program working toward 2-5 days per week of home exercise.  Intensity: Work with a perceived exertion of 11 (fairly light) - 15 (hard) while following your exercise prescription.  We will make changes to your prescription with you as you progress through the program.   Duration: Be able to do 30 to 45 minutes of continuous aerobic exercise in addition to a 5 minute warm-up and a 5 minute cool-down routine.   Nutrition Goals: Your personal nutrition goals will  be established when you do your nutrition analysis with the dietician.  The following are general nutrition guidelines to follow: Cholesterol < '200mg'$ /day Sodium < '1500mg'$ /day Fiber: Men over 50 yrs - 30 grams per day  Personal Goals:  Personal Goals and Risk Factors at Admission - 12/21/21 1306       Core Components/Risk Factors/Patient Goals on Admission    Weight Management Yes    Intervention Weight Management: Develop a combined nutrition and exercise program designed to reach desired caloric intake, while maintaining appropriate intake of nutrient and fiber, sodium and fats, and appropriate energy expenditure required for the weight goal.;Weight Management: Provide education and appropriate resources to help participant work on and attain dietary goals.;Weight Management/Obesity: Establish reasonable short term and long term weight goals.    Admit Weight 203 lb 1.6 oz (92.1 kg)    Goal Weight: Short Term 196 lb (88.9 kg)    Goal Weight: Long Term 180 lb (81.6 kg)    Expected Outcomes Short Term: Continue to assess and modify interventions until short term weight is achieved;Long Term: Adherence to nutrition and physical activity/exercise program aimed toward attainment of established weight  goal;Weight Loss: Understanding of general recommendations for a balanced deficit meal plan, which promotes 1-2 lb weight loss per week and includes a negative energy balance of [phone] kcal/d    Diabetes Yes    Intervention Provide education about signs/symptoms and action to take for hypo/hyperglycemia.;Provide education about proper nutrition, including hydration, and aerobic/resistive exercise prescription along with prescribed medications to achieve blood glucose in normal ranges: Fasting glucose 65-99 mg/dL    Expected Outcomes Short Term: Participant verbalizes understanding of the signs/symptoms and immediate care of hyper/hypoglycemia, proper foot care and importance of medication,  aerobic/resistive exercise and nutrition plan for blood glucose control.;Long Term: Attainment of HbA1C < 7%.    Hypertension Yes    Intervention Provide education on lifestyle modifcations including regular physical activity/exercise, weight management, moderate sodium restriction and increased consumption of fresh fruit, vegetables, and low fat dairy, alcohol moderation, and smoking cessation.;Monitor prescription use compliance.    Expected Outcomes Short Term: Continued assessment and intervention until BP is < 140/35m HG in hypertensive participants. < 130/872mHG in hypertensive participants with diabetes, heart failure or chronic kidney disease.;Long Term: Maintenance of blood pressure at goal levels.    Lipids Yes    Intervention Provide education and support for participant on nutrition & aerobic/resistive exercise along with prescribed medications to achieve LDL '70mg'$ , HDL >'40mg'$ .    Expected Outcomes Short Term: Participant states understanding of desired cholesterol values and is compliant with medications prescribed. Participant is following exercise prescription and nutrition guidelines.;Long Term: Cholesterol controlled with medications as prescribed, with individualized exercise RX and with personalized nutrition plan. Value goals: LDL < '70mg'$ , HDL > 40 mg.             Tobacco Use Initial Evaluation: Social History   Tobacco Use  Smoking Status Never  Smokeless Tobacco Never    Exercise Goals and Review:  Exercise Goals     Row Name 12/21/21 1305             Exercise Goals   Increase Physical Activity Yes       Intervention Provide advice, education, support and counseling about physical activity/exercise needs.;Develop an individualized exercise prescription for aerobic and resistive training based on initial evaluation findings, risk stratification, comorbidities and participant's personal goals.       Expected Outcomes Short Term: Attend rehab on a regular basis to  increase amount of physical activity.;Long Term: Exercising regularly at least 3-5 days a week.;Long Term: Add in home exercise to make exercise part of routine and to increase amount of physical activity.       Increase Strength and Stamina Yes       Intervention Provide advice, education, support and counseling about physical activity/exercise needs.;Develop an individualized exercise prescription for aerobic and resistive training based on initial evaluation findings, risk stratification, comorbidities and participant's personal goals.       Expected Outcomes Short Term: Increase workloads from initial exercise prescription for resistance, speed, and METs.;Short Term: Perform resistance training exercises routinely during rehab and add in resistance training at home;Long Term: Improve cardiorespiratory fitness, muscular endurance and strength as measured by increased METs and functional capacity (6MWT)       Able to understand and use rate of perceived exertion (RPE) scale Yes       Intervention Provide education and explanation on how to use RPE scale       Expected Outcomes Short Term: Able to use RPE daily in rehab to express subjective intensity level;Long Term:  Able to  use RPE to guide intensity level when exercising independently       Able to understand and use Dyspnea scale Yes       Intervention Provide education and explanation on how to use Dyspnea scale       Expected Outcomes Long Term: Able to use Dyspnea scale to guide intensity level when exercising independently;Short Term: Able to use Dyspnea scale daily in rehab to express subjective sense of shortness of breath during exertion       Knowledge and understanding of Target Heart Rate Range (THRR) Yes       Intervention Provide education and explanation of THRR including how the numbers were predicted and where they are located for reference       Expected Outcomes Short Term: Able to state/look up THRR;Long Term: Able to use THRR to  govern intensity when exercising independently;Short Term: Able to use daily as guideline for intensity in rehab       Able to check pulse independently Yes       Intervention Provide education and demonstration on how to check pulse in carotid and radial arteries.;Review the importance of being able to check your own pulse for safety during independent exercise       Expected Outcomes Short Term: Able to explain why pulse checking is important during independent exercise;Long Term: Able to check pulse independently and accurately       Understanding of Exercise Prescription Yes       Intervention Provide education, explanation, and written materials on patient's individual exercise prescription       Expected Outcomes Short Term: Able to explain program exercise prescription;Long Term: Able to explain home exercise prescription to exercise independently

## 2021-12-25 ENCOUNTER — Encounter: Payer: Medicare Other | Admitting: *Deleted

## 2021-12-25 ENCOUNTER — Other Ambulatory Visit: Payer: Self-pay | Admitting: Thoracic Surgery (Cardiothoracic Vascular Surgery)

## 2021-12-25 DIAGNOSIS — Z951 Presence of aortocoronary bypass graft: Secondary | ICD-10-CM

## 2021-12-25 DIAGNOSIS — I214 Non-ST elevation (NSTEMI) myocardial infarction: Secondary | ICD-10-CM

## 2021-12-25 LAB — GLUCOSE, CAPILLARY: Glucose-Capillary: 140 mg/dL — ABNORMAL HIGH (ref 70–99)

## 2021-12-25 NOTE — Progress Notes (Signed)
Daily Session Note  Patient Details  Name: Samuel [name] MRN: [id number] Date of Birth: 11-15-1950 Referring Provider:   Flowsheet Row Cardiac Rehab from 12/21/2021 in [facility] Cardiac [facility] Pulmonary [facility]  Referring Provider Isaias Cowman, MD       Encounter Date: 12/25/2021  Check In:  Session Check In - 12/25/21 1118       Check-In   Supervising physician immediately available to respond to emergencies See telemetry face sheet for immediately available ER MD    Location [facility]-Cardiac & Pulmonary Rehab    Staff Present Earlean Shawl, BS, ACSM CEP, Exercise Physiologist;Noah Tickle, BS, Exercise Physiologist;Meredith Sherryll Burger, RN Odelia Gage, RN, ADN    Virtual Visit No    Medication changes reported     No    Fall or balance concerns reported    No    Warm-up and Cool-down Performed on first and last piece of equipment    Resistance Training Performed Yes    VAD Patient? No    PAD/SET Patient? No      Pain Assessment   Currently in Pain? No/denies                Social History   Tobacco Use  Smoking Status Never  Smokeless Tobacco Never    Goals Met:  Independence with exercise equipment Exercise tolerated well No report of concerns or symptoms today Strength training completed today  Goals Unmet:  Not Applicable  Comments: First full day of exercise!  Patient was oriented to gym and equipment including functions, settings, policies, and procedures.  Patient's individual exercise prescription and treatment plan were reviewed.  All starting workloads were established based on the results of the 6 minute walk test done at initial orientation visit.  The plan for exercise progression was also introduced and progression will be customized based on patient's performance and goals.    Dr. Emily Filbert is Medical Director for [place].  Dr. Ottie Glazier is Medical Director for [facility] Pulmonary Rehabilitation.

## 2021-12-26 ENCOUNTER — Ambulatory Visit
Admission: RE | Admit: 2021-12-26 | Discharge: 2021-12-26 | Disposition: A | Discharge status: Home / Self Care | Payer: Medicare Other | Source: Ambulatory Visit | Attending: Thoracic Surgery (Cardiothoracic Vascular Surgery) | Admitting: Thoracic Surgery (Cardiothoracic Vascular Surgery)

## 2021-12-26 ENCOUNTER — Ambulatory Visit (INDEPENDENT_AMBULATORY_CARE_PROVIDER_SITE_OTHER): Payer: Self-pay | Admitting: Surgical

## 2021-12-26 VITALS — BP 147/90 | HR 89 | Resp 20 | Ht 67.0 in | Wt 208.0 lb

## 2021-12-26 DIAGNOSIS — Z951 Presence of aortocoronary bypass graft: Secondary | ICD-10-CM

## 2021-12-26 NOTE — Patient Instructions (Signed)
Activity progression including driving and lifting restrictions as discussed

## 2021-12-26 NOTE — Progress Notes (Signed)
[address]       [place] [id number]             [phone]      Matthew L Debella Bucklin Medical Record #[id number] Date of Birth: 13-Aug-1950  Referring: Isaias Cowman, MD Primary Care: Kirk Ruths, MD Primary Cardiologist: None   Chief Complaint:   POST OP FOLLOW UP 11/24/2021 Patient:  Samuel [name] Pre-Op Dx: CAD with LM stenosis   Post-op Dx:  same Procedure: CABG X 3 with the LIMA to the LAD, RSVG from the aorta to the PDA and from the aorta to the OM   Endoscopic greater saphenous vein harvest on the right     Surgeon and Role:      Coralie Common, MD- Primary    * Wynelle Beckmann , PA-C - assisting      Enid Cutter, PA-C    History of Present Illness:    The patient is seen in routine post cardiac surgery follow-up status post the above described procedure.  He reports that overall he is feeling well with no specific difficulties in terms of healing.  He takes an occasional Tylenol in terms of pain.  He is not having any significant chest pain or shortness of breath.  He denies fevers, chills or other significant constitutional symptoms.  He is not having palpitations or lower extremity edema.  Overall he is quite pleased with his recovery.      Past Medical History:  Diagnosis Date   Anxiety    situational   Arthritis    Cancer ([place])    squamous cell carcinoma   ED (erectile dysfunction)    GERD (gastroesophageal reflux disease)    Hyperlipidemia    Hypertension    OSA (obstructive sleep apnea)    CPAP   Stroke ([place])    no residual    Uncontrolled type 2 diabetes mellitus with hyperglycemia, with long-term current use of insulin (HCC)      Social History   Tobacco Use  Smoking Status Never  Smokeless Tobacco Never    Social History   Substance and Sexual Activity  Alcohol Use Yes   Alcohol/week: 2.0 standard drinks of alcohol   Types: 2 Standard drinks or equivalent per week     No Known  Allergies  Current Outpatient Medications  Medication Sig Dispense Refill   acetaminophen (TYLENOL) 500 MG tablet Take 1-2 tablets (500-1,000 mg total) by mouth every 6 (six) hours as needed. 30 tablet 0   aspirin 81 MG EC tablet Take 81 mg by mouth daily.      atorvastatin (LIPITOR) 80 MG tablet Take 1 tablet (80 mg total) by mouth daily. 30 tablet 0   clopidogrel (PLAVIX) 75 MG tablet Take 1 tablet (75 mg total) by mouth daily. 30 tablet 11   fluticasone (FLONASE) 50 MCG/ACT nasal spray Place 2 sprays into both nostrils as needed for allergies or rhinitis.     furosemide (LASIX) 40 MG tablet Take 1 tablet (40 mg total) by mouth daily. 7 tablet 0   loratadine (CLARITIN) 10 MG tablet Take 10 mg by mouth daily.     metFORMIN (GLUCOPHAGE) 500 MG tablet Take 1 tablet (500 mg total) by mouth 2 (two) times daily with a meal.     metoprolol tartrate (LOPRESSOR) 25 MG tablet Take 0.5 tablets (12.5 mg total) by mouth 2 (two) times daily. 60 tablet 3   nitroGLYCERIN (NITROSTAT) 0.4 MG SL tablet  Place under the tongue.     omeprazole (PRILOSEC) 20 MG capsule Take 20 mg by mouth daily before breakfast.     oxybutynin (DITROPAN-XL) 5 MG 24 hr tablet Take 5 mg by mouth daily.     potassium chloride (KLOR-CON) 10 MEQ tablet Take 1 tablet (10 mEq total) by mouth 2 (two) times daily. 10 tablet 0   furosemide (LASIX) 40 MG tablet Take 1 tablet (40 mg total) by mouth daily for 5 doses. 5 tablet 0   No current facility-administered medications for this visit.       Physical Exam: BP (!) 147/90 (BP Location: Right Arm, Patient Position: Sitting, Cuff Size: Normal)   Pulse 89   Resp 20   Ht '5\' 7"'$  (1.702 m)   Wt 208 lb (94.3 kg)   SpO2 95% Comment: RA  BMI 32.58 kg/m   General appearance: alert, cooperative, and no distress Heart: regular rate and rhythm Lungs: clear to auscultation bilaterally Abdomen: Benign exam Extremities: No edema Wound: Well-healed without evidence of  infection.   Diagnostic Studies & Laboratory data:     Recent Radiology Findings:   DG Chest 2 View  Result Date: 12/26/2021 CLINICAL DATA:  Status post CABG EXAM: CHEST - 2 VIEW COMPARISON:  Chest x-ray dated December 05, 2021 FINDINGS: Cardiac and mediastinal contours are unchanged status post CABG. Fat containing hiatal hernia. Mild blunting of the right costophrenic angle, likely due to atelectasis. Lungs otherwise clear. No evidence of pleural effusion. No pneumothorax. IMPRESSION: No active cardiopulmonary disease. Electronically Signed   By: Yetta Glassman M.D.   On: 12/26/2021 15:19      Recent Lab Findings: Lab Results  Component Value Date   WBC 10.1 11/28/2021   HGB 9.4 (L) 11/28/2021   HCT 28.1 (L) 11/28/2021   PLT 255 11/28/2021   GLUCOSE 134 (H) 11/29/2021   CHOL 174 11/21/2021   TRIG 197 (H) 11/21/2021   HDL 27 (L) 11/21/2021   LDLCALC 108 (H) 11/21/2021   ALT 17 11/20/2021   AST 27 11/20/2021   NA 134 (L) 11/29/2021   K 3.9 11/29/2021   CL 95 (L) 11/29/2021   CREATININE 1.05 11/29/2021   BUN 21 11/29/2021   CO2 26 11/29/2021   INR 1.2 11/24/2021   HGBA1C 7.3 (H) 11/21/2021      Assessment / Plan: Patient is doing quite well.  We discussed activity progression including lifting restrictions and driving.  He is already started cardiac rehab and doing well with their program.  I reviewed his chest x-ray and there are no concerning findings.  I did not make any changes to his current medication regimen.  He will continue long-term management of his cardiology issues with cardiology.  We will see the patient again on a as needed basis for any surgically related needs or at request.      Medication Changes: No orders of the defined types were placed in this encounter.     John Giovanni, PA-C  12/26/2021 3:36 PM

## 2021-12-27 ENCOUNTER — Encounter: Payer: Self-pay | Admitting: *Deleted

## 2021-12-27 ENCOUNTER — Encounter: Payer: Medicare Other | Attending: Cardiology | Admitting: *Deleted

## 2021-12-27 DIAGNOSIS — Z48812 Encounter for surgical aftercare following surgery on the circulatory system: Secondary | ICD-10-CM | POA: Diagnosis not present

## 2021-12-27 DIAGNOSIS — Z951 Presence of aortocoronary bypass graft: Secondary | ICD-10-CM | POA: Insufficient documentation

## 2021-12-27 DIAGNOSIS — I252 Old myocardial infarction: Secondary | ICD-10-CM | POA: Insufficient documentation

## 2021-12-27 DIAGNOSIS — I214 Non-ST elevation (NSTEMI) myocardial infarction: Secondary | ICD-10-CM

## 2021-12-27 LAB — GLUCOSE, CAPILLARY
Glucose-Capillary: 136 mg/dL — ABNORMAL HIGH (ref 70–99)
Glucose-Capillary: 92 mg/dL (ref 70–99)

## 2021-12-27 NOTE — Progress Notes (Signed)
Cardiac Individual Treatment Plan  Patient Details  Name: MAHIN GUARDIA MRN: [id number] Date of Birth: 1950/10/25 Referring Provider:   Flowsheet Row Cardiac Rehab from 12/21/2021 in [facility] Cardiac and Pulmonary Rehab  Referring Provider Isaias Cowman, MD       Initial Encounter Date:  Flowsheet Row Cardiac Rehab from 12/21/2021 in [facility] Cardiac and Pulmonary Rehab  Date 12/21/21       Visit Diagnosis: NSTEMI (non-ST elevation myocardial infarction) ([place])  S/P CABG x 3  Patient's Home Medications on Admission:  Current Outpatient Medications:    acetaminophen (TYLENOL) 500 MG tablet, Take 1-2 tablets (500-1,000 mg total) by mouth every 6 (six) hours as needed., Disp: 30 tablet, Rfl: 0   aspirin 81 MG EC tablet, Take 81 mg by mouth daily. , Disp: , Rfl:    atorvastatin (LIPITOR) 80 MG tablet, Take 1 tablet (80 mg total) by mouth daily., Disp: 30 tablet, Rfl: 0   clopidogrel (PLAVIX) 75 MG tablet, Take 1 tablet (75 mg total) by mouth daily., Disp: 30 tablet, Rfl: 11   fluticasone (FLONASE) 50 MCG/ACT nasal spray, Place 2 sprays into both nostrils as needed for allergies or rhinitis., Disp: , Rfl:    furosemide (LASIX) 40 MG tablet, Take 1 tablet (40 mg total) by mouth daily., Disp: 7 tablet, Rfl: 0   furosemide (LASIX) 40 MG tablet, Take 1 tablet (40 mg total) by mouth daily for 5 doses., Disp: 5 tablet, Rfl: 0   loratadine (CLARITIN) 10 MG tablet, Take 10 mg by mouth daily., Disp: , Rfl:    metFORMIN (GLUCOPHAGE) 500 MG tablet, Take 1 tablet (500 mg total) by mouth 2 (two) times daily with a meal., Disp: , Rfl:    metoprolol tartrate (LOPRESSOR) 25 MG tablet, Take 0.5 tablets (12.5 mg total) by mouth 2 (two) times daily., Disp: 60 tablet, Rfl: 3   nitroGLYCERIN (NITROSTAT) 0.4 MG SL tablet, Place under the tongue., Disp: , Rfl:    omeprazole (PRILOSEC) 20 MG capsule, Take 20 mg by mouth daily before breakfast., Disp: , Rfl:    oxybutynin (DITROPAN-XL) 5 MG 24 hr tablet, Take 5  mg by mouth daily., Disp: , Rfl:    potassium chloride (KLOR-CON) 10 MEQ tablet, Take 1 tablet (10 mEq total) by mouth 2 (two) times daily., Disp: 10 tablet, Rfl: 0  Past Medical History: Past Medical History:  Diagnosis Date   Anxiety    situational   Arthritis    Cancer ([place])    squamous cell carcinoma   ED (erectile dysfunction)    GERD (gastroesophageal reflux disease)    Hyperlipidemia    Hypertension    OSA (obstructive sleep apnea)    CPAP   Stroke ([place])    no residual    Uncontrolled type 2 diabetes mellitus with hyperglycemia, with long-term current use of insulin (HCC)     Tobacco Use: Social History   Tobacco Use  Smoking Status Never  Smokeless Tobacco Never    Labs: Review Flowsheet  More data may exist      Latest Ref Rng & Units 03/18/2019 11/03/2019 11/21/2021 11/23/2021 11/24/2021  Labs for ITP Cardiac and Pulmonary Rehab  Cholestrol 0 - 200 mg/dL - - 174  - -  LDL (calc) 0 - 99 mg/dL - - 108  - -  HDL-C >40 mg/dL - - 27  - -  Trlycerides <150 mg/dL - - 197  - -  Hemoglobin A1c 4.8 - 5.6 % 6.9  6.6  7.3  - -  PH, Arterial 7.35 - 7.45 - - - 7.46  7.340  7.375  7.306  7.276  7.317  7.383  7.334  7.324   PCO2 arterial 32 - 48 mmHg - - - 32  43.0  35.1  43.0  48.3  47.5  39.7  49.0  41.7   Bicarbonate 20.0 - 28.0 mmol/L - - - 22.8  23.3  20.7  21.7  22.7  24.3  23.6  24.9  26.1  21.7   TCO2 22 - 32 mmol/L - - - - '25  22  23  24  24  26  24  25  24  26  28  23  26  23   '$ Acid-base deficit 0.0 - 2.0 mmol/L - - - 0.3  3.0  4.0  5.0  4.0  2.0  1.0  1.0  4.0   O2 Saturation % - - - 98.1  87  98  99  98  100  100  79  100  99      Exercise Target Goals: Exercise Program Goal: Individual exercise prescription set using results from initial 6 min walk test and THRR while considering  patient's activity barriers and safety.   Exercise Prescription Goal: Initial exercise prescription builds to 30-45 minutes a day of aerobic activity, 2-3 days per week.  Home  exercise guidelines will be given to patient during program as part of exercise prescription that the participant will acknowledge.   Education: Aerobic Exercise: - Group verbal and visual presentation on the components of exercise prescription. Introduces F.I.T.T principle from ACSM for exercise prescriptions.  Reviews F.I.T.T. principles of aerobic exercise including progression. Written material given at graduation.   Education: Resistance Exercise: - Group verbal and visual presentation on the components of exercise prescription. Introduces F.I.T.T principle from ACSM for exercise prescriptions  Reviews F.I.T.T. principles of resistance exercise including progression. Written material given at graduation.    Education: Exercise & Equipment Safety: - Individual verbal instruction and demonstration of equipment use and safety with use of the equipment.   Education: Exercise Physiology & General Exercise Guidelines: - Group verbal and written instruction with models to review the exercise physiology of the cardiovascular system and associated critical values. Provides general exercise guidelines with specific guidelines to those with heart or lung disease.    Education: Flexibility, Balance, Mind/Body Relaxation: - Group verbal and visual presentation with interactive activity on the components of exercise prescription. Introduces F.I.T.T principle from ACSM for exercise prescriptions. Reviews F.I.T.T. principles of flexibility and balance exercise training including progression. Also discusses the mind body connection.  Reviews various relaxation techniques to help reduce and manage stress (i.e. Deep breathing, progressive muscle relaxation, and visualization). Balance handout provided to take home. Written material given at graduation.   Activity Barriers & Risk Stratification:  Activity Barriers & Cardiac Risk Stratification - 12/21/21 1253       Activity Barriers & Cardiac Risk  Stratification   Activity Barriers Left Knee Replacement;Right Knee Replacement;Arthritis   arthritis in both arms limits ROM   Cardiac Risk Stratification High             6 Minute Walk:  6 Minute Walk     Row Name 12/21/21 1252         6 Minute Walk   Phase Initial     Distance 1235 feet     Walk Time 6 minutes     # of Rest Breaks 0     MPH 2.34  METS 2.49     RPE 11     Perceived Dyspnea  2     VO2 Peak 8.71     Symptoms Yes (comment)     Comments SOB     Resting HR 64 bpm     Resting BP 114/66     Resting Oxygen Saturation  98 %     Exercise Oxygen Saturation  during 6 min walk 97 %     Max Ex. HR 104 bpm     Max Ex. BP 130/80     2 Minute Post BP 122/76              Oxygen Initial Assessment:   Oxygen Re-Evaluation:   Oxygen Discharge (Final Oxygen Re-Evaluation):   Initial Exercise Prescription:  Initial Exercise Prescription - 12/21/21 1300       Date of Initial Exercise RX and Referring Provider   Date 12/21/21    Referring Provider Isaias Cowman, MD      Oxygen   Maintain Oxygen Saturation 88% or higher      Treadmill   MPH 2    Grade 0.5    Minutes 15    METs 2.67      NuStep   Level 2    SPM 80    Minutes 15    METs 2.49      REL-XR   Level 1    Speed 50    Minutes 15    METs 2.49      Prescription Details   Frequency (times per week) 3    Duration Progress to 30 minutes of continuous aerobic without signs/symptoms of physical distress      Intensity   THRR 40-80% of Max Heartrate 98-131    Ratings of Perceived Exertion 11-13    Perceived Dyspnea 0-4      Progression   Progression Continue to progress workloads to maintain intensity without signs/symptoms of physical distress.      Resistance Training   Training Prescription Yes    Weight 3 lb    Reps 10-15             Perform Capillary Blood Glucose checks as needed.  Exercise Prescription Changes:   Exercise Prescription Changes      Row Name 12/21/21 1300             Response to Exercise   Blood Pressure (Admit) 114/66       Blood Pressure (Exercise) 130/80       Blood Pressure (Exit) 122/76       Heart Rate (Admit) 64 bpm       Heart Rate (Exercise) 104 bpm       Heart Rate (Exit) 67 bpm       Oxygen Saturation (Admit) 98 %       Oxygen Saturation (Exercise) 97 %       Rating of Perceived Exertion (Exercise) 11       Perceived Dyspnea (Exercise) 2       Symptoms SOB       Comments 6MWT Results                Exercise Comments:   Exercise Comments     Row Name 12/25/21 1125           Exercise Comments First full day of exercise!  Patient was oriented to gym and equipment including functions, settings, policies, and procedures.  Patient's individual exercise prescription and treatment plan were reviewed.  All starting workloads were established based on the results of the 6 minute walk test done at initial orientation visit.  The plan for exercise progression was also introduced and progression will be customized based on patient's performance and goals.                Exercise Goals and Review:   Exercise Goals     Row Name 12/21/21 1305             Exercise Goals   Increase Physical Activity Yes       Intervention Provide advice, education, support and counseling about physical activity/exercise needs.;Develop an individualized exercise prescription for aerobic and resistive training based on initial evaluation findings, risk stratification, comorbidities and participant's personal goals.       Expected Outcomes Short Term: Attend rehab on a regular basis to increase amount of physical activity.;Long Term: Exercising regularly at least 3-5 days a week.;Long Term: Add in home exercise to make exercise part of routine and to increase amount of physical activity.       Increase Strength and Stamina Yes       Intervention Provide advice, education, support and counseling about physical  activity/exercise needs.;Develop an individualized exercise prescription for aerobic and resistive training based on initial evaluation findings, risk stratification, comorbidities and participant's personal goals.       Expected Outcomes Short Term: Increase workloads from initial exercise prescription for resistance, speed, and METs.;Short Term: Perform resistance training exercises routinely during rehab and add in resistance training at home;Long Term: Improve cardiorespiratory fitness, muscular endurance and strength as measured by increased METs and functional capacity (6MWT)       Able to understand and use rate of perceived exertion (RPE) scale Yes       Intervention Provide education and explanation on how to use RPE scale       Expected Outcomes Short Term: Able to use RPE daily in rehab to express subjective intensity level;Long Term:  Able to use RPE to guide intensity level when exercising independently       Able to understand and use Dyspnea scale Yes       Intervention Provide education and explanation on how to use Dyspnea scale       Expected Outcomes Long Term: Able to use Dyspnea scale to guide intensity level when exercising independently;Short Term: Able to use Dyspnea scale daily in rehab to express subjective sense of shortness of breath during exertion       Knowledge and understanding of Target Heart Rate Range (THRR) Yes       Intervention Provide education and explanation of THRR including how the numbers were predicted and where they are located for reference       Expected Outcomes Short Term: Able to state/look up THRR;Long Term: Able to use THRR to govern intensity when exercising independently;Short Term: Able to use daily as guideline for intensity in rehab       Able to check pulse independently Yes       Intervention Provide education and demonstration on how to check pulse in carotid and radial arteries.;Review the importance of being able to check your own pulse for  safety during independent exercise       Expected Outcomes Short Term: Able to explain why pulse checking is important during independent exercise;Long Term: Able to check pulse independently and accurately       Understanding of Exercise Prescription Yes       Intervention Provide education,  explanation, and written materials on patient's individual exercise prescription       Expected Outcomes Short Term: Able to explain program exercise prescription;Long Term: Able to explain home exercise prescription to exercise independently                Exercise Goals Re-Evaluation :  Exercise Goals Re-Evaluation     Row Name 12/25/21 1125             Exercise Goal Re-Evaluation   Exercise Goals Review Able to understand and use rate of perceived exertion (RPE) scale;Able to understand and use Dyspnea scale;Knowledge and understanding of Target Heart Rate Range (THRR);Understanding of Exercise Prescription       Comments Reviewed RPE scale, THR and program prescription with pt today.  Pt voiced understanding and was given a copy of goals to take home.       Expected Outcomes Short: Use RPE daily to regulate intensity.  Long: Follow program prescription in THR.                Discharge Exercise Prescription (Final Exercise Prescription Changes):  Exercise Prescription Changes - 12/21/21 1300       Response to Exercise   Blood Pressure (Admit) 114/66    Blood Pressure (Exercise) 130/80    Blood Pressure (Exit) 122/76    Heart Rate (Admit) 64 bpm    Heart Rate (Exercise) 104 bpm    Heart Rate (Exit) 67 bpm    Oxygen Saturation (Admit) 98 %    Oxygen Saturation (Exercise) 97 %    Rating of Perceived Exertion (Exercise) 11    Perceived Dyspnea (Exercise) 2    Symptoms SOB    Comments 6MWT Results             Nutrition:  Target Goals: Understanding of nutrition guidelines, daily intake of sodium '1500mg'$ , cholesterol '200mg'$ , calories 30% from fat and 7% or less from  saturated fats, daily to have 5 or more servings of fruits and vegetables.  Education: All About Nutrition: -Group instruction provided by verbal, written material, interactive activities, discussions, models, and posters to present general guidelines for heart healthy nutrition including fat, fiber, MyPlate, the role of sodium in heart healthy nutrition, utilization of the nutrition label, and utilization of this knowledge for meal planning. Follow up email sent as well. Written material given at graduation.   Biometrics:  Pre Biometrics - 12/21/21 1306       Pre Biometrics   Height 5' 7.5" (1.715 m)    Weight 203 lb 1.6 oz (92.1 kg)    Waist Circumference 43 inches    Hip Circumference 42.5 inches    Waist to Hip Ratio 1.01 %    BMI (Calculated) 31.32    Single Leg Stand 7.5 seconds   R             Nutrition Therapy Plan and Nutrition Goals:  Nutrition Therapy & Goals - 12/21/21 0914       Nutrition Therapy   Diet Heart healthy, low Na, T2DM MNT    Drug/Food Interactions Statins/Certain Fruits    Protein (specify units) 85-95g    Fiber 31 grams    Whole Grain Foods 3 servings    Saturated Fats 17 max. grams    Fruits and Vegetables 8 servings/day    Sodium 2 grams      Personal Nutrition Goals   Nutrition Goal ST: practice adding volume, fiber, protein, and healthy fats to meals with MyPlate as well as review  paperwork LT: achieve and maintain A1C <7, at least 31g of fiber/day, <17g saturated fat/day, follow MyPlate guidelines    Comments 71 y.o. M admitted to cardiac rehab s/p NSTEMI and CABG x3. PMHx includes HTN, HLD, T2DM, stg 3a CKD, OSA, RBBB, GERD. PSHx Left and right TKA 2021. Relevant medications includes lipitor, furosemide, metformin, omeprazole. Wyett and [name] reports making changes recently to both of their diets after his diabetes diagnosis and after speaking with the doctor about his heart health. Food recall: Coffee (zero sugar creamer) B: 1  glass of 1% milk or tomato juice with breakfast. 1 egg from 2 eggs with 1.5 pieces of sausage or bacon and grits (no longer putting sugar in it) or cheerios (honey nut) or oatmeal with some butter and honey L: sandwich (whole wheat) with ham and cheese with olive oil and vinegar dressing and mustard or light mayo D: vaires: wife will cook dinner. Lean ground [place] with spaghetti (they have switched to ground [place] and chicken mostly for protein - grilling or baking normally). he likes pinto beans, peas, carrots, turnip greens, bell peppers, and onions. Drinks: lemonade (crystal lite), sometimes small coke zero. Occassionally will have a beer or 2. Discussed heart healthy eating and MyPlate guidelines as well as considerations for BG management and T2DM. his wife is interested in mixing cauliflower rice with regular rice and trying lentils.      Intervention Plan   Intervention Prescribe, educate and counsel regarding individualized specific dietary modifications aiming towards targeted core components such as weight, hypertension, lipid management, diabetes, heart failure and other comorbidities.;Nutrition handout(s) given to patient.    Expected Outcomes Short Term Goal: Understand basic principles of dietary content, such as calories, fat, sodium, cholesterol and nutrients.;Short Term Goal: A plan has been developed with personal nutrition goals set during dietitian appointment.;Long Term Goal: Adherence to prescribed nutrition plan.             Nutrition Assessments:  MEDIFICTS Score Key: ?70 Need to make dietary changes  40-70 Heart Healthy Diet ? 40 Therapeutic Level Cholesterol Diet  Flowsheet Row Cardiac Rehab from 12/25/2021 in [facility] Cardiac and Pulmonary Rehab  Picture Your Plate Total Score on Admission 55      Picture Your Plate Scores: <16 Unhealthy dietary pattern with much room for improvement. 41-50 Dietary pattern unlikely to meet recommendations for good health and room  for improvement. 51-60 More healthful dietary pattern, with some room for improvement.  >60 Healthy dietary pattern, although there may be some specific behaviors that could be improved.    Nutrition Goals Re-Evaluation:   Nutrition Goals Discharge (Final Nutrition Goals Re-Evaluation):   Psychosocial: Target Goals: Acknowledge presence or absence of significant depression and/or stress, maximize coping skills, provide positive support system. Participant is able to verbalize types and ability to use techniques and skills needed for reducing stress and depression.   Education: Stress, Anxiety, and Depression - Group verbal and visual presentation to define topics covered.  Reviews how body is impacted by stress, anxiety, and depression.  Also discusses healthy ways to reduce stress and to treat/manage anxiety and depression.  Written material given at graduation.   Education: Sleep Hygiene -Provides group verbal and written instruction about how sleep can affect your health.  Define sleep hygiene, discuss sleep cycles and impact of sleep habits. Review good sleep hygiene tips.    Initial Review & Psychosocial Screening:  Initial Psych Review & Screening - 12/14/21 1342       Initial  Review   Current issues with None Identified      Family Dynamics   Good Support System? Yes   wife  married 50 years     Barriers   Psychosocial barriers to participate in program There are no identifiable barriers or psychosocial needs.      Screening Interventions   Interventions Encouraged to exercise;To provide support and resources with identified psychosocial needs;Provide feedback about the scores to participant    Expected Outcomes Short Term goal: Utilizing psychosocial counselor, staff and physician to assist with identification of specific Stressors or current issues interfering with healing process. Setting desired goal for each stressor or current issue identified.;Long Term Goal:  Stressors or current issues are controlled or eliminated.;Short Term goal: Identification and review with participant of any Quality of Life or Depression concerns found by scoring the questionnaire.;Long Term goal: The participant improves quality of Life and PHQ9 Scores as seen by post scores and/or verbalization of changes             Quality of Life Scores:   Quality of Life - 12/25/21 1625       Quality of Life   Select Quality of Life      Quality of Life Scores   Health/Function Pre 16.37 %    Socioeconomic Pre 23.79 %    Psych/Spiritual Pre 24 %    Family Pre 27.6 %    GLOBAL Pre 21.12 %            Scores of 19 and below usually indicate a poorer quality of life in these areas.  A difference of  2-3 points is a clinically meaningful difference.  A difference of 2-3 points in the total score of the Quality of Life Index has been associated with significant improvement in overall quality of life, self-image, physical symptoms, and general health in studies assessing change in quality of life.  PHQ-9: Review Flowsheet       12/21/2021 02/07/2015  Depression screen PHQ 2/9  Decreased Interest 0 0  Down, Depressed, Hopeless 0 0  PHQ - 2 Score 0 0  Altered sleeping 0 -  Tired, decreased energy 1 -  Change in appetite 0 -  Feeling bad or failure about yourself  0 -  Trouble concentrating 0 -  Moving slowly or fidgety/restless 0 -  Suicidal thoughts 0 -  PHQ-9 Score 1 -  Difficult doing work/chores Somewhat difficult -   Interpretation of Total Score  Total Score Depression Severity:  1-4 = Minimal depression, 5-9 = Mild depression, 10-14 = Moderate depression, 15-19 = Moderately severe depression, 20-27 = Severe depression   Psychosocial Evaluation and Intervention:  Psychosocial Evaluation - 12/14/21 1350       Psychosocial Evaluation & Interventions   Interventions Encouraged to exercise with the program and follow exercise prescription    Comments Jasai  has no barriers to attending the program. He lives with his wife of 57 years. She is his support. He wants to get back to normal. He does play golf. He is ready to start the program    Expected Outcomes STG [place] attends all scheduled sessions, he is able to progress his exercise to the point he can get back to his golf game. LTG Curlee continues to work on his exercise progression after discharge.    Continue Psychosocial Services  Follow up required by staff             Psychosocial Re-Evaluation:   Psychosocial Discharge (Final Psychosocial  Re-Evaluation):   Vocational Rehabilitation: Provide vocational rehab assistance to qualifying candidates.   Vocational Rehab Evaluation & Intervention:   Education: Education Goals: Education classes will be provided on a variety of topics geared toward better understanding of heart health and risk factor modification. Participant will state understanding/return demonstration of topics presented as noted by education test scores.  Learning Barriers/Preferences:  Learning Barriers/Preferences - 12/14/21 1343       Learning Barriers/Preferences   Learning Barriers None    Learning Preferences None             General Cardiac Education Topics:  AED/CPR: - Group verbal and written instruction with the use of models to demonstrate the basic use of the AED with the basic ABC's of resuscitation.   Anatomy and Cardiac Procedures: - Group verbal and visual presentation and models provide information about basic cardiac anatomy and function. Reviews the testing methods done to diagnose heart disease and the outcomes of the test results. Describes the treatment choices: Medical Management, Angioplasty, or Coronary Bypass Surgery for treating various heart conditions including Myocardial Infarction, Angina, Valve Disease, and Cardiac Arrhythmias.  Written material given at graduation.   Medication Safety: - Group verbal and visual  instruction to review commonly prescribed medications for heart and lung disease. Reviews the medication, class of the drug, and side effects. Includes the steps to properly store meds and maintain the prescription regimen.  Written material given at graduation.   Intimacy: - Group verbal instruction through game format to discuss how heart and lung disease can affect sexual intimacy. Written material given at graduation..   Know Your Numbers and Heart Failure: - Group verbal and visual instruction to discuss disease risk factors for cardiac and pulmonary disease and treatment options.  Reviews associated critical values for Overweight/Obesity, Hypertension, Cholesterol, and Diabetes.  Discusses basics of heart failure: signs/symptoms and treatments.  Introduces Heart Failure Zone chart for action plan for heart failure.  Written material given at graduation.   Infection Prevention: - Provides verbal and written material to individual with discussion of infection control including proper hand washing and proper equipment cleaning during exercise session.   Falls Prevention: - Provides verbal and written material to individual with discussion of falls prevention and safety. Flowsheet Row Cardiac Rehab from 12/14/2021 in [facility] Cardiac and Pulmonary Rehab  Date 12/14/21  Educator SB  Instruction Review Code 1- Verbalizes Understanding       Other: -Provides group and verbal instruction on various topics (see comments)   Knowledge Questionnaire Score:  Knowledge Questionnaire Score - 12/25/21 1624       Knowledge Questionnaire Score   Pre Score 23/26             Core Components/Risk Factors/Patient Goals at Admission:  Personal Goals and Risk Factors at Admission - 12/21/21 1306       Core Components/Risk Factors/Patient Goals on Admission    Weight Management Yes    Intervention Weight Management: Develop a combined nutrition and exercise program designed to reach desired  caloric intake, while maintaining appropriate intake of nutrient and fiber, sodium and fats, and appropriate energy expenditure required for the weight goal.;Weight Management: Provide education and appropriate resources to help participant work on and attain dietary goals.;Weight Management/Obesity: Establish reasonable short term and long term weight goals.    Admit Weight 203 lb 1.6 oz (92.1 kg)    Goal Weight: Short Term 196 lb (88.9 kg)    Goal Weight: Long Term 180 lb (81.6 kg)  Expected Outcomes Short Term: Continue to assess and modify interventions until short term weight is achieved;Long Term: Adherence to nutrition and physical activity/exercise program aimed toward attainment of established weight goal;Weight Loss: Understanding of general recommendations for a balanced deficit meal plan, which promotes 1-2 lb weight loss per week and includes a negative energy balance of [phone] kcal/d    Diabetes Yes    Intervention Provide education about signs/symptoms and action to take for hypo/hyperglycemia.;Provide education about proper nutrition, including hydration, and aerobic/resistive exercise prescription along with prescribed medications to achieve blood glucose in normal ranges: Fasting glucose 65-99 mg/dL    Expected Outcomes Short Term: Participant verbalizes understanding of the signs/symptoms and immediate care of hyper/hypoglycemia, proper foot care and importance of medication, aerobic/resistive exercise and nutrition plan for blood glucose control.;Long Term: Attainment of HbA1C < 7%.    Hypertension Yes    Intervention Provide education on lifestyle modifcations including regular physical activity/exercise, weight management, moderate sodium restriction and increased consumption of fresh fruit, vegetables, and low fat dairy, alcohol moderation, and smoking cessation.;Monitor prescription use compliance.    Expected Outcomes Short Term: Continued assessment and intervention until BP  is < 140/76m HG in hypertensive participants. < 130/833mHG in hypertensive participants with diabetes, heart failure or chronic kidney disease.;Long Term: Maintenance of blood pressure at goal levels.    Lipids Yes    Intervention Provide education and support for participant on nutrition & aerobic/resistive exercise along with prescribed medications to achieve LDL '70mg'$ , HDL >'40mg'$ .    Expected Outcomes Short Term: Participant states understanding of desired cholesterol values and is compliant with medications prescribed. Participant is following exercise prescription and nutrition guidelines.;Long Term: Cholesterol controlled with medications as prescribed, with individualized exercise RX and with personalized nutrition plan. Value goals: LDL < '70mg'$ , HDL > 40 mg.             Education:Diabetes - Individual verbal and written instruction to review signs/symptoms of diabetes, desired ranges of glucose level fasting, after meals and with exercise. Acknowledge that pre and post exercise glucose checks will be done for 3 sessions at entry of program.   Core Components/Risk Factors/Patient Goals Review:    Core Components/Risk Factors/Patient Goals at Discharge (Final Review):    ITP Comments:  ITP Comments     Row Name 12/14/21 1354 12/21/21 1246 12/25/21 1125 12/27/21 1041     ITP Comments Virtual orientation call completed today. he has an appointment on Date: 12/21/2021  for EP eval and gym Orientation.  [facility] . Completed 6MWT and gym orientation. Initial ITP created and sent for review to Dr BeRamonita LabMedical Director. First full day of exercise!  Patient was oriented to gym and equipment including functions, settings, policies, and procedures.  Patient's individual exercise prescription and treatment plan were reviewed.  All starting workloads were established based on the results of the 6 minute walk test done at initial  orientation visit.  The plan for exercise progression was also introduced and progression will be customized based on patient's performance and goals. 30 Day review completed. Medical Director ITP review done, changes made as directed, and signed approval by Medical Director.    NEW TO PROGRAM             Comments:

## 2021-12-27 NOTE — Progress Notes (Signed)
Daily Session Note  Patient Details  Name: Samuel [name] MRN: [id number] Date of Birth: [birth year] Referring Provider:   Flowsheet Row Cardiac Rehab from 12/21/2021 in [facility] Cardiac and Pulmonary Rehab  Referring Provider Samuel Cowman, MD       Encounter Date: 12/27/2021  Check In:  Session Check In - 12/27/21 1115       Check-In   Supervising physician immediately available to respond to emergencies See telemetry face sheet for immediately available ER MD    Location [facility]-Cardiac & Pulmonary Rehab    Staff Present Antionette Fairy, BS, Exercise Physiologist;Meredith Sherryll Burger, RN BSN;Joseph Rosebud Poles, RN, [place]    Virtual Visit No    Medication changes reported     No    Fall or balance concerns reported    No    Warm-up and Cool-down Performed on first and last piece of equipment    Resistance Training Performed Yes    VAD Patient? No    PAD/SET Patient? No      Pain Assessment   Currently in Pain? No/denies                Social History   Tobacco Use  Smoking Status Never  Smokeless Tobacco Never    Goals Met:  Independence with exercise equipment Exercise tolerated well No report of concerns or symptoms today Strength training completed today  Goals Unmet:  Not Applicable  Comments: Pt able to follow exercise prescription today without complaint.  Will continue to monitor for progression.    Dr. Emily Filbert is Medical Director for [name].  Dr. Ottie Glazier is Medical Director for [name] Pulmonary Rehabilitation.

## 2021-12-29 ENCOUNTER — Encounter: Payer: Medicare Other | Admitting: *Deleted

## 2021-12-29 DIAGNOSIS — Z951 Presence of aortocoronary bypass graft: Secondary | ICD-10-CM

## 2021-12-29 DIAGNOSIS — I214 Non-ST elevation (NSTEMI) myocardial infarction: Secondary | ICD-10-CM

## 2021-12-29 LAB — GLUCOSE, CAPILLARY
Glucose-Capillary: 184 mg/dL — ABNORMAL HIGH (ref 70–99)
Glucose-Capillary: 141 mg/dL — ABNORMAL HIGH (ref 70–99)

## 2021-12-29 NOTE — Progress Notes (Signed)
Daily Session Note  Patient Details  Name: LADARRIUS BOGDANSKI MRN: [id number] Date of Birth: 1950-12-20 Referring Provider:   Flowsheet Row Cardiac Rehab from 12/21/2021 in [facility] Cardiac and Pulmonary Rehab  Referring Provider Isaias Cowman, MD       Encounter Date: 12/29/2021  Check In:  Session Check In - 12/29/21 1135       Check-In   Supervising physician immediately available to respond to emergencies See telemetry face sheet for immediately available ER MD    Location [facility]-Cardiac & Pulmonary Rehab    Staff Present Heath Lark, RN, BSN, CCRP;Jessica [place], MA, RCEP, CCRP, CCET;Joseph Greasy, [place]    Virtual Visit No    Medication changes reported     No    Fall or balance concerns reported    No    Warm-up and Cool-down Performed on first and last piece of equipment    Resistance Training Performed Yes    VAD Patient? No    PAD/SET Patient? No      Pain Assessment   Currently in Pain? No/denies                Social History   Tobacco Use  Smoking Status Never  Smokeless Tobacco Never    Goals Met:  Independence with exercise equipment Exercise tolerated well No report of concerns or symptoms today  Goals Unmet:  Not Applicable  Comments: Pt able to follow exercise prescription today without complaint.  Will continue to monitor for progression.    Dr. Emily Filbert is Medical Director for [place].  Dr. Ottie Glazier is Medical Director for [facility] Pulmonary Rehabilitation.

## 2022-01-01 ENCOUNTER — Encounter: Payer: Medicare Other | Admitting: *Deleted

## 2022-01-01 DIAGNOSIS — I214 Non-ST elevation (NSTEMI) myocardial infarction: Secondary | ICD-10-CM

## 2022-01-01 DIAGNOSIS — Z951 Presence of aortocoronary bypass graft: Secondary | ICD-10-CM

## 2022-01-01 NOTE — Progress Notes (Signed)
Daily Session Note  Patient Details  Name: Samuel [name] MRN: [id number] Date of Birth: April 21, 1950 Referring Provider:   Flowsheet Row Cardiac Rehab from 12/21/2021 in [facility] Cardiac and Pulmonary Rehab  Referring Provider Isaias Cowman, MD       Encounter Date: 01/01/2022  Check In:  Session Check In - 01/01/22 1118       Check-In   Supervising physician immediately available to respond to emergencies See telemetry face sheet for immediately available ER MD    Location [facility]-Cardiac & Pulmonary Rehab    Staff Present Antionette Fairy, BS, Exercise Physiologist;Kelly Amedeo Plenty, BS, ACSM CEP, Exercise Physiologist;Tayton Decaire Tamala Julian, RN, ADN    Virtual Visit No    Medication changes reported     No    Fall or balance concerns reported    No    Warm-up and Cool-down Performed on first and last piece of equipment    Resistance Training Performed Yes    VAD Patient? No    PAD/SET Patient? No      Pain Assessment   Currently in Pain? No/denies                Social History   Tobacco Use  Smoking Status Never  Smokeless Tobacco Never    Goals Met:  Independence with exercise equipment Exercise tolerated well No report of concerns or symptoms today Strength training completed today  Goals Unmet:  Not Applicable  Comments: Pt able to follow exercise prescription today without complaint.  Will continue to monitor for progression.    Dr. Emily Filbert is Medical Director for [place].  Dr. Ottie Glazier is Medical Director for [facility] Pulmonary Rehabilitation.

## 2022-01-03 ENCOUNTER — Encounter: Payer: Medicare Other | Admitting: *Deleted

## 2022-01-03 DIAGNOSIS — Z951 Presence of aortocoronary bypass graft: Secondary | ICD-10-CM | POA: Diagnosis not present

## 2022-01-03 DIAGNOSIS — I214 Non-ST elevation (NSTEMI) myocardial infarction: Secondary | ICD-10-CM

## 2022-01-03 NOTE — Progress Notes (Signed)
Daily Session Note  Patient Details  Name: Samuel [name] MRN: [id number] Date of Birth: [birth year] Referring Provider:   Flowsheet Row Cardiac Rehab from 12/21/2021 in [facility] Cardiac and Pulmonary Rehab  Referring Provider Isaias Cowman, MD       Encounter Date: 01/03/2022  Check In:  Session Check In - 01/03/22 1101       Check-In   Supervising physician immediately available to respond to emergencies See telemetry face sheet for immediately available ER MD    Location [facility]-Cardiac & Pulmonary Rehab    Staff Present Renita Papa, RN Odelia Gage, RN, ADN;Joseph Hood, RCP,RRT,BSRT;Noah Tickle, BS, Exercise Physiologist    Virtual Visit No    Medication changes reported     No    Fall or balance concerns reported    No    Warm-up and Cool-down Performed on first and last piece of equipment    Resistance Training Performed Yes    VAD Patient? No    PAD/SET Patient? No      Pain Assessment   Currently in Pain? No/denies                Social History   Tobacco Use  Smoking Status Never  Smokeless Tobacco Never    Goals Met:  Independence with exercise equipment Exercise tolerated well No report of concerns or symptoms today Strength training completed today  Goals Unmet:  Not Applicable  Comments: Pt able to follow exercise prescription today without complaint.  Will continue to monitor for progression.    Dr. Emily Filbert is Medical Director for [place].  Dr. Ottie Glazier is Medical Director for [facility] Pulmonary Rehabilitation.

## 2022-01-05 ENCOUNTER — Encounter: Payer: Medicare Other | Admitting: *Deleted

## 2022-01-05 DIAGNOSIS — Z951 Presence of aortocoronary bypass graft: Secondary | ICD-10-CM

## 2022-01-05 DIAGNOSIS — I214 Non-ST elevation (NSTEMI) myocardial infarction: Secondary | ICD-10-CM

## 2022-01-05 NOTE — Progress Notes (Signed)
Daily Session Note  Patient Details  Name: Samuel [name] MRN: [id number] Date of Birth: May 05, 1950 Referring Provider:   Flowsheet Row Cardiac Rehab from 12/21/2021 in [facility] Cardiac and Pulmonary Rehab  Referring Provider Isaias Cowman, MD       Encounter Date: 01/05/2022  Check In:  Session Check In - 01/05/22 1146       Check-In   Supervising physician immediately available to respond to emergencies See telemetry face sheet for immediately available ER MD    Location [facility]-Cardiac & Pulmonary Rehab    Staff Present Heath Lark, RN, BSN, CCRP;Jessica [place], MA, RCEP, CCRP, CCET;Joseph [place], [place]    Virtual Visit No    Medication changes reported     No    Fall or balance concerns reported    No    Warm-up and Cool-down Performed on first and last piece of equipment    Resistance Training Performed Yes    VAD Patient? No    PAD/SET Patient? No      Pain Assessment   Currently in Pain? No/denies               Exercise Prescription Changes - 01/05/22 1100       Home Exercise Plan   Plans to continue exercise at Home (comment)   walking   Frequency Add 2 additional days to program exercise sessions.    Initial Home Exercises Provided 01/05/22             Social History   Tobacco Use  Smoking Status Never  Smokeless Tobacco Never    Goals Met:  Independence with exercise equipment Exercise tolerated well No report of concerns or symptoms today  Goals Unmet:  Not Applicable  Comments: Pt able to follow exercise prescription today without complaint.  Will continue to monitor for progression.    Dr. Emily Filbert is Medical Director for [place].  Dr. Ottie Glazier is Medical Director for [facility] Pulmonary Rehabilitation.

## 2022-01-08 ENCOUNTER — Encounter: Payer: Medicare Other | Admitting: *Deleted

## 2022-01-08 DIAGNOSIS — Z951 Presence of aortocoronary bypass graft: Secondary | ICD-10-CM | POA: Diagnosis not present

## 2022-01-08 DIAGNOSIS — I214 Non-ST elevation (NSTEMI) myocardial infarction: Secondary | ICD-10-CM

## 2022-01-08 NOTE — Progress Notes (Signed)
Daily Session Note  Patient Details  Name: Samuel [name] MRN: [id number] Date of Birth: 01/14/1951 Referring Provider:   Flowsheet Row Cardiac Rehab from 12/21/2021 in [facility] Cardiac and Pulmonary Rehab  Referring Provider Isaias Cowman, MD       Encounter Date: 01/08/2022  Check In:  Session Check In - 01/08/22 1114       Check-In   Supervising physician immediately available to respond to emergencies See telemetry face sheet for immediately available ER MD    Location [facility]-Cardiac & Pulmonary Rehab    Staff Present Antionette Fairy, BS, Exercise Physiologist;Kelly Amedeo Plenty, BS, ACSM CEP, Exercise Physiologist;Manroop Jakubowicz Tamala Julian, RN, ADN    Virtual Visit No    Medication changes reported     No    Fall or balance concerns reported    No    Warm-up and Cool-down Performed on first and last piece of equipment    Resistance Training Performed Yes    VAD Patient? No    PAD/SET Patient? No      Pain Assessment   Currently in Pain? No/denies                Social History   Tobacco Use  Smoking Status Never  Smokeless Tobacco Never    Goals Met:  Independence with exercise equipment Exercise tolerated well No report of concerns or symptoms today Strength training completed today  Goals Unmet:  Not Applicable  Comments: Pt able to follow exercise prescription today without complaint.  Will continue to monitor for progression.    Dr. Emily Filbert is Medical Director for [place].  Dr. Ottie Glazier is Medical Director for [facility] Pulmonary Rehabilitation.

## 2022-01-10 ENCOUNTER — Encounter: Payer: Medicare Other | Admitting: *Deleted

## 2022-01-10 DIAGNOSIS — I214 Non-ST elevation (NSTEMI) myocardial infarction: Secondary | ICD-10-CM

## 2022-01-10 DIAGNOSIS — Z951 Presence of aortocoronary bypass graft: Secondary | ICD-10-CM

## 2022-01-10 NOTE — Progress Notes (Signed)
Daily Session Note  Patient Details  Name: Samuel [name] MRN: [id number] Date of Birth: September 15, 1950 Referring Provider:   Flowsheet Row Cardiac Rehab from 12/21/2021 in [facility] Cardiac and Pulmonary Rehab  Referring Provider Isaias Cowman, MD       Encounter Date: 01/10/2022  Check In:  Session Check In - 01/10/22 1131       Check-In   Supervising physician immediately available to respond to emergencies See telemetry face sheet for immediately available ER MD    Location [facility]-Cardiac & Pulmonary Rehab    Staff Present Antionette Fairy, BS, Exercise Physiologist;Meredith Sherryll Burger, RN BSN;Joseph Rosebud Poles, RN, [place]    Virtual Visit No    Medication changes reported     No    Fall or balance concerns reported    No    Warm-up and Cool-down Performed on first and last piece of equipment    Resistance Training Performed Yes    VAD Patient? No    PAD/SET Patient? No      Pain Assessment   Currently in Pain? No/denies                Social History   Tobacco Use  Smoking Status Never  Smokeless Tobacco Never    Goals Met:  Independence with exercise equipment Exercise tolerated well No report of concerns or symptoms today Strength training completed today  Goals Unmet:  Not Applicable  Comments: Pt able to follow exercise prescription today without complaint.  Will continue to monitor for progression.    Dr. Emily Filbert is Medical Director for [place].  Dr. Ottie Glazier is Medical Director for [facility] Pulmonary Rehabilitation.

## 2022-01-15 ENCOUNTER — Encounter: Payer: Medicare Other | Admitting: *Deleted

## 2022-01-15 DIAGNOSIS — Z951 Presence of aortocoronary bypass graft: Secondary | ICD-10-CM

## 2022-01-15 DIAGNOSIS — I214 Non-ST elevation (NSTEMI) myocardial infarction: Secondary | ICD-10-CM

## 2022-01-15 NOTE — Progress Notes (Signed)
Daily Session Note  Patient Details  Name: Samuel [name] MRN: [id number] Date of Birth: 1950/03/10 Referring Provider:   Flowsheet Row Cardiac Rehab from 12/21/2021 in [facility] Cardiac and Pulmonary Rehab  Referring Provider Isaias Cowman, MD       Encounter Date: 01/15/2022  Check In:  Session Check In - 01/15/22 1118       Check-In   Supervising physician immediately available to respond to emergencies See telemetry face sheet for immediately available ER MD    Location [facility]-Cardiac & Pulmonary Rehab    Staff Present Renita Papa, RN Odelia Gage, RN, Doyce Para, BS, ACSM CEP, Exercise Physiologist;Noah Tickle, BS, Exercise Physiologist    Virtual Visit No    Medication changes reported     No    Fall or balance concerns reported    No    Warm-up and Cool-down Performed on first and last piece of equipment    Resistance Training Performed Yes    VAD Patient? No    PAD/SET Patient? No      Pain Assessment   Currently in Pain? No/denies                Social History   Tobacco Use  Smoking Status Never  Smokeless Tobacco Never    Goals Met:  Independence with exercise equipment Exercise tolerated well No report of concerns or symptoms today Strength training completed today  Goals Unmet:  Not Applicable  Comments: Pt able to follow exercise prescription today without complaint.  Will continue to monitor for progression.    Dr. Emily Filbert is Medical Director for [place].  Dr. Ottie Glazier is Medical Director for [facility] Pulmonary Rehabilitation.

## 2022-01-17 ENCOUNTER — Encounter: Payer: Medicare Other | Admitting: *Deleted

## 2022-01-17 DIAGNOSIS — Z951 Presence of aortocoronary bypass graft: Secondary | ICD-10-CM

## 2022-01-17 DIAGNOSIS — I214 Non-ST elevation (NSTEMI) myocardial infarction: Secondary | ICD-10-CM

## 2022-01-17 NOTE — Progress Notes (Signed)
Daily Session Note  Patient Details  Name: Samuel [name] MRN: [id number] Date of Birth: 1950-08-21 Referring Provider:   Flowsheet Row Cardiac Rehab from 12/21/2021 in [facility] Cardiac and Pulmonary Rehab  Referring Provider Isaias Cowman, MD       Encounter Date: 01/17/2022  Check In:  Session Check In - 01/17/22 1114       Check-In   Supervising physician immediately available to respond to emergencies See telemetry face sheet for immediately available ER MD    Location [facility]-Cardiac & Pulmonary Rehab    Staff Present Heath Lark, RN, BSN, CCRP;Meredith Sherryll Burger, RN Odelia Gage, RN, [place]ia Blase, MS, ASCM CEP, Exercise Physiologist    Virtual Visit No    Medication changes reported     No    Fall or balance concerns reported    No    Warm-up and Cool-down Performed on first and last piece of equipment    Resistance Training Performed Yes    VAD Patient? No    PAD/SET Patient? No      Pain Assessment   Currently in Pain? No/denies                Social History   Tobacco Use  Smoking Status Never  Smokeless Tobacco Never    Goals Met:  Independence with exercise equipment Exercise tolerated well No report of concerns or symptoms today Strength training completed today  Goals Unmet:  Not Applicable  Comments: Pt able to follow exercise prescription today without complaint.  Will continue to monitor for progression.    Dr. Emily Filbert is Medical Director for [name].  Dr. Ottie Glazier is Medical Director for [facility] Pulmonary Rehabilitation.

## 2022-01-22 ENCOUNTER — Encounter (INDEPENDENT_AMBULATORY_CARE_PROVIDER_SITE_OTHER): Payer: Self-pay | Admitting: Vascular Surgery

## 2022-01-22 ENCOUNTER — Encounter: Payer: Medicare Other | Admitting: *Deleted

## 2022-01-22 DIAGNOSIS — I214 Non-ST elevation (NSTEMI) myocardial infarction: Secondary | ICD-10-CM

## 2022-01-22 DIAGNOSIS — Z951 Presence of aortocoronary bypass graft: Secondary | ICD-10-CM

## 2022-01-22 NOTE — Progress Notes (Signed)
Daily Session Note  Patient Details  Name: Samuel [name] MRN: [id number] Date of Birth: 1950-10-13 Referring Provider:   Flowsheet Row Cardiac Rehab from 12/21/2021 in [facility] Cardiac and Pulmonary Rehab  Referring Provider Isaias Cowman, MD       Encounter Date: 01/22/2022  Check In:  Session Check In - 01/22/22 1133       Check-In   Supervising physician immediately available to respond to emergencies See telemetry face sheet for immediately available ER MD    Location [facility]-Cardiac & Pulmonary Rehab    Staff Present Earlean Shawl, BS, ACSM CEP, Exercise Physiologist;Noah Tickle, BS, Exercise Physiologist;Mahogany [place]rance Tamala Julian, RN, ADN;Meredith Sherryll Burger, RN BSN    Virtual Visit No    Medication changes reported     No    Fall or balance concerns reported    No    Warm-up and Cool-down Performed on first and last piece of equipment    Resistance Training Performed Yes    VAD Patient? No    PAD/SET Patient? No      Pain Assessment   Currently in Pain? No/denies                Social History   Tobacco Use  Smoking Status Never  Smokeless Tobacco Never    Goals Met:  Independence with exercise equipment Exercise tolerated well No report of concerns or symptoms today Strength training completed today  Goals Unmet:  Not Applicable  Comments: Pt able to follow exercise prescription today without complaint.  Will continue to monitor for progression.    Dr. Emily Filbert is Medical Director for [place].  Dr. Ottie Glazier is Medical Director for [facility] Pulmonary Rehabilitation.

## 2022-01-23 ENCOUNTER — Ambulatory Visit (INDEPENDENT_AMBULATORY_CARE_PROVIDER_SITE_OTHER): Payer: Medicare Other | Admitting: Vascular Surgery

## 2022-01-23 ENCOUNTER — Encounter (INDEPENDENT_AMBULATORY_CARE_PROVIDER_SITE_OTHER): Payer: Self-pay | Admitting: Vascular Surgery

## 2022-01-23 VITALS — BP 104/71 | HR 69 | Resp 18 | Ht 66.5 in | Wt 205.4 lb

## 2022-01-23 DIAGNOSIS — I700 Atherosclerosis of aorta: Secondary | ICD-10-CM | POA: Insufficient documentation

## 2022-01-23 DIAGNOSIS — I6523 Occlusion and stenosis of bilateral carotid arteries: Secondary | ICD-10-CM

## 2022-01-23 DIAGNOSIS — I10 Essential (primary) hypertension: Secondary | ICD-10-CM

## 2022-01-23 DIAGNOSIS — Z951 Presence of aortocoronary bypass graft: Secondary | ICD-10-CM

## 2022-01-23 DIAGNOSIS — N182 Chronic kidney disease, stage 2 (mild): Secondary | ICD-10-CM

## 2022-01-23 DIAGNOSIS — E785 Hyperlipidemia, unspecified: Secondary | ICD-10-CM

## 2022-01-23 DIAGNOSIS — M79609 Pain in unspecified limb: Secondary | ICD-10-CM | POA: Insufficient documentation

## 2022-01-23 DIAGNOSIS — E1122 Type 2 diabetes mellitus with diabetic chronic kidney disease: Secondary | ICD-10-CM | POA: Diagnosis not present

## 2022-01-23 DIAGNOSIS — M79606 Pain in leg, unspecified: Secondary | ICD-10-CM

## 2022-01-23 NOTE — Assessment & Plan Note (Signed)
lipid control important in reducing the progression of atherosclerotic disease. Continue statin therapy  

## 2022-01-23 NOTE — Progress Notes (Signed)
Patient ID: Samuel [name], male   DOB: 1950/05/21, 71 y.o.   MRN: [id number]  Chief Complaint  Patient presents with   Establish Care    Referred by Dr Josefa Half    HPI Samuel [name] is a 71 y.o. male.  I am asked to see the patient by Dr. Saralyn Pilar for evaluation of carotid artery stenosis as well as aortic atherosclerosis.  I have taken care of the patient's father over a decade ago for an abdominal aortic aneurysm the patient had a screening ultrasound over a decade ago for this.  About 2 months ago, he was found to have severe coronary disease and required three-vessel coronary artery bypass grafting.  As part of his preoperative workup, a carotid duplex, ABIs, and a CT scan of the chest were performed which I have independently reviewed.  His ABIs showed no significant lower extremity arterial insufficiency.  His CT scan showed some scattered atherosclerotic disease of the aorta and the chest and upper abdomen without severe narrowing or aneurysmal degeneration in the visualized portions.  The most significant finding was on his carotid duplex.  He had carotid duplex velocity criteria which would fall in the 80 to 99% range on the right in the 60 to 79% range on the left.  He was recommended to wait at least 3 months after his coronary bypass before considering surgery for his carotid disease, but is referred for further evaluation.  He has a remote history of stroke many years ago but no recent focal neurologic symptoms.     Past Medical History:  Diagnosis Date   Anxiety    situational   Arthritis    Cancer ([place])    squamous cell carcinoma   ED (erectile dysfunction)    GERD (gastroesophageal reflux disease)    Hyperlipidemia    Hypertension    OSA (obstructive sleep apnea)    CPAP   Stroke ([place])    no residual    Uncontrolled type 2 diabetes mellitus with hyperglycemia, with long-term current use of insulin (HCC)     Past Surgical History:  Procedure Laterality Date   CARDIAC  CATHETERIZATION     COLONOSCOPY     CORONARY ARTERY BYPASS GRAFT N/A 11/24/2021   Procedure: CORONARY ARTERY BYPASS GRAFTING (CABG) X3 BYPASSES, USING OPEN LEFT INTERNAL MAMMARY ARTERY AND ENDOSCOPIC RIGHT GREATER SAPHENOUS VEIN HARVEST;  Surgeon: Coralie Common, MD;  Location: [facility];  Service: Open Heart Surgery;  Laterality: N/A;   KNEE ARTHROPLASTY Left 03/20/2019   Procedure: LEFT COMPUTER ASSISTED TOTAL KNEE ARTHROPLASTY;  Surgeon: Dereck Leep, MD;  Location: [facility];  Service: Orthopedics;  Laterality: Left;   KNEE ARTHROPLASTY Right 11/09/2019   Procedure: COMPUTER ASSISTED TOTAL KNEE ARTHROPLASTY;  Surgeon: Dereck Leep, MD;  Location: [facility];  Service: Orthopedics;  Laterality: Right;   KNEE ARTHROSCOPY Left    LEFT HEART CATH N/A 11/21/2021   Procedure: Left Heart Cath;  Surgeon: Isaias Cowman, MD;  Location: [facility];  Service: Cardiovascular;  Laterality: N/A;   SHOULDER ARTHROSCOPY Right 2009   rotator cuff   TEE WITHOUT CARDIOVERSION N/A 11/24/2021   Procedure: TRANSESOPHAGEAL ECHOCARDIOGRAM (TEE);  Surgeon: Coralie Common, MD;  Location: [facility];  Service: Open Heart Surgery;  Laterality: N/A;   TONSILLECTOMY       Family History  Problem Relation Age of Onset   CAD Sister    CAD Brother   Father had AAA    Social History   Tobacco  Use   Smoking status: Never   Smokeless tobacco: Never  Vaping Use   Vaping Use: Never used  Substance Use Topics   Alcohol use: Yes    Alcohol/week: 2.0 standard drinks of alcohol    Types: 2 Standard drinks or equivalent per week   Drug use: Never     No Known Allergies  Current Outpatient Medications  Medication Sig Dispense Refill   acetaminophen (TYLENOL) 500 MG tablet Take 1-2 tablets (500-1,000 mg total) by mouth every 6 (six) hours as needed. 30 tablet 0   amLODipine (NORVASC) 5 MG tablet Take 5 mg by mouth daily.     aspirin 81 MG EC tablet Take 81 mg by mouth daily.      atorvastatin (LIPITOR)  80 MG tablet Take 1 tablet (80 mg total) by mouth daily. 30 tablet 0   clopidogrel (PLAVIX) 75 MG tablet Take 1 tablet (75 mg total) by mouth daily. 30 tablet 11   lisinopril-hydrochlorothiazide (ZESTORETIC) 20-25 MG tablet Take 1 tablet by mouth daily.     loratadine (CLARITIN) 10 MG tablet Take 10 mg by mouth daily.     metFORMIN (GLUCOPHAGE) 500 MG tablet Take 1 tablet (500 mg total) by mouth 2 (two) times daily with a meal.     metoprolol tartrate (LOPRESSOR) 25 MG tablet Take 0.5 tablets (12.5 mg total) by mouth 2 (two) times daily. 60 tablet 3   nitroGLYCERIN (NITROSTAT) 0.4 MG SL tablet Place under the tongue.     omeprazole (PRILOSEC) 20 MG capsule Take 20 mg by mouth daily before breakfast.     ONETOUCH ULTRA test strip CHECK BLOOD SUGAR TWICE DAILY.     oxybutynin (DITROPAN-XL) 5 MG 24 hr tablet Take 5 mg by mouth daily.     pravastatin (PRAVACHOL) 40 MG tablet Take 40 mg by mouth daily.     No current facility-administered medications for this visit.      REVIEW OF SYSTEMS (Negative unless checked)  Constitutional: '[]'$ Weight loss  '[]'$ Fever  '[]'$ Chills Cardiac: '[]'$ Chest pain   '[]'$ Chest pressure   '[]'$ Palpitations   '[]'$ Shortness of breath when laying flat   '[]'$ Shortness of breath at rest   '[x]'$ Shortness of breath with exertion. Vascular:  '[]'$ Pain in legs with walking   '[]'$ Pain in legs at rest   '[]'$ Pain in legs when laying flat   '[]'$ Claudication   '[]'$ Pain in feet when walking  '[]'$ Pain in feet at rest  '[]'$ Pain in feet when laying flat   '[]'$ History of DVT   '[]'$ Phlebitis   '[]'$ Swelling in legs   '[]'$ Varicose veins   '[]'$ Non-healing ulcers Pulmonary:   '[]'$ Uses home oxygen   '[]'$ Productive cough   '[]'$ Hemoptysis   '[]'$ Wheeze  '[]'$ COPD   '[]'$ Asthma Neurologic:  '[]'$ Dizziness  '[]'$ Blackouts   '[]'$ Seizures   '[x]'$ History of stroke   '[]'$ History of TIA  '[]'$ Aphasia   '[]'$ Temporary blindness   '[]'$ Dysphagia   '[]'$ Weakness or numbness in arms   '[]'$ Weakness or numbness in legs Musculoskeletal:  '[x]'$ Arthritis   '[]'$ Joint swelling   '[x]'$ Joint pain   '[]'$ Low  back pain Hematologic:  '[]'$ Easy bruising  '[]'$ Easy bleeding   '[]'$ Hypercoagulable state   '[]'$ Anemic  '[]'$ Hepatitis Gastrointestinal:  '[]'$ Blood in stool   '[]'$ Vomiting blood  '[]'$ Gastroesophageal reflux/heartburn   '[]'$ Abdominal pain Genitourinary:  '[]'$ Chronic kidney disease   '[]'$ Difficult urination  '[]'$ Frequent urination  '[]'$ Burning with urination   '[]'$ Hematuria Skin:  '[]'$ Rashes   '[]'$ Ulcers   '[]'$ Wounds Psychological:  '[]'$ History of anxiety   '[]'$  History of major depression.    Physical Exam BP  104/71 (BP Location: Right Arm)   Pulse 69   Resp 18   Ht 5' 6.5" (1.689 m)   Wt 205 lb 6.4 oz (93.2 kg)   BMI 32.66 kg/m  Gen:  WD/WN, NAD Head: [place]/AT, No temporalis wasting.  Ear/Nose/Throat: Hearing grossly intact, nares w/o erythema or drainage, oropharynx w/o Erythema/Exudate Eyes: Conjunctiva clear, sclera non-icteric  Neck: trachea midline.  No JVD.  Pulmonary:  Good air movement, respirations not labored, no use of accessory muscles  Cardiac: RRR, no JVD Vascular:  Vessel Right Left  Radial Palpable Palpable                          DP Palpable Palpable  PT Palpable Palpable   Gastrointestinal:. No masses, surgical incisions, or scars. Musculoskeletal: M/S 5/5 throughout.  Extremities without ischemic changes.  No deformity or atrophy.  No edema. Neurologic: Sensation grossly intact in extremities.  Symmetrical.  Speech is fluent. Motor exam as listed above. Psychiatric: Judgment intact, Mood & affect appropriate for pt's clinical situation. Dermatologic: No rashes or ulcers noted.  No cellulitis or open wounds.    Radiology DG Chest 2 View  Result Date: 12/26/2021 CLINICAL DATA:  Status post CABG EXAM: CHEST - 2 VIEW COMPARISON:  Chest x-ray dated December 05, 2021 FINDINGS: Cardiac and mediastinal contours are unchanged status post CABG. Fat containing hiatal hernia. Mild blunting of the right costophrenic angle, likely due to atelectasis. Lungs otherwise clear. No evidence of pleural effusion.  No pneumothorax. IMPRESSION: No active cardiopulmonary disease. Electronically Signed   By: Yetta Glassman M.D.   On: 12/26/2021 15:19    Labs Recent Results (from the past 2160 hour(s))  Troponin I (High Sensitivity)     Status: Abnormal   Collection Time: 11/20/21  5:44 PM  Result Value Ref Range   Troponin I (High Sensitivity) 183 (HH) <18 ng/L    Comment: CRITICAL RESULT CALLED TO, READ BACK BY AND VERIFIED WITH DR. Frazier Richards AT 1852 11/20/2021 DLB (NOTE) Elevated high sensitivity troponin I (hsTnI) values and significant  changes across serial measurements may suggest ACS but many other  chronic and acute conditions are known to elevate hsTnI results.  Refer to the "Links" section for chest pain algorithms and additional  guidance. Performed at [facility], [address] [place], [place] [address]   D-dimer, quantitative     Status: Abnormal   Collection Time: 11/20/21  5:44 PM  Result Value Ref Range   D-Dimer, Quant 1.15 (H) 0.00 - 0.50 ug/mL-FEU    Comment: (NOTE) At the manufacturer cut-off value of 0.5 g/mL FEU, this assay has a negative predictive value of 95-100%.This assay is intended for use in conjunction with a clinical pretest probability (PTP) assessment model to exclude pulmonary embolism (PE) and deep venous thrombosis (DVT) in outpatients suspected of PE or DVT. Results should be correlated with clinical presentation. Performed at [name], [place], [place] [address]   Troponin I (High Sensitivity)     Status: Abnormal   Collection Time: 11/20/21  7:56 PM  Result Value Ref Range   Troponin I (High Sensitivity) 198 (HH) <18 ng/L    Comment: CRITICAL RESULT CALLED TO, READ BACK BY AND VERIFIED WITH VANESSA ASHLEY '@2038'$  ON 11/20/21 SKL (NOTE) Elevated high sensitivity troponin I (hsTnI) values and significant  changes across serial measurements may suggest ACS but many other  chronic and acute conditions  are known to elevate hsTnI results.  Refer to the "Links" section for chest pain algorithms and additional  guidance. Performed at [facility], [address] [place], [place] [address]   Comprehensive metabolic panel     Status: Abnormal   Collection Time: 11/20/21  7:56 PM  Result Value Ref Range   Sodium 137 135 - 145 mmol/L   Potassium 4.3 3.5 - 5.1 mmol/L   Chloride 103 98 - 111 mmol/L   CO2 19 (L) 22 - 32 mmol/L   Glucose, Bld 302 (H) 70 - 99 mg/dL    Comment: Glucose reference range applies only to samples taken after fasting for at least 8 hours.   BUN 24 (H) 8 - 23 mg/dL   Creatinine, Ser 1.30 (H) 0.61 - 1.24 mg/dL   Calcium 9.7 8.9 - 10.3 mg/dL   Total Protein 7.9 6.5 - 8.1 g/dL   Albumin 3.9 3.5 - 5.0 g/dL   AST 27 15 - 41 U/L   ALT 17 0 - 44 U/L   Alkaline Phosphatase 65 38 - 126 U/L   Total Bilirubin 0.6 0.3 - 1.2 mg/dL   GFR, Estimated 58 (L) >60 mL/min    Comment: (NOTE) Calculated using the CKD-EPI Creatinine Equation (2021)    Anion gap 15 5 - 15    Comment: Performed at [facility], [address] [place], [place] [address]  CBC with Differential     Status: None   Collection Time: 11/20/21  7:56 PM  Result Value Ref Range   WBC 10.1 4.0 - 10.5 K/uL   RBC 4.87 4.22 - 5.81 MIL/uL   Hemoglobin 14.3 13.0 - 17.0 g/dL   HCT 42.2 39.0 - 52.0 %   MCV 86.7 80.0 - 100.0 fL   MCH 29.4 26.0 - 34.0 pg   MCHC 33.9 30.0 - 36.0 g/dL   RDW 14.3 11.5 - 15.5 %   Platelets 296 150 - 400 K/uL   nRBC 0.0 0.0 - 0.2 %   Neutrophils Relative % 64 %   Neutro Abs 6.3 1.7 - 7.7 K/uL   Lymphocytes Relative 29 %   Lymphs Abs 3.0 0.7 - 4.0 K/uL   Monocytes Relative 5 %   Monocytes Absolute 0.5 0.1 - 1.0 K/uL   Eosinophils Relative 2 %   Eosinophils Absolute 0.2 0.0 - 0.5 K/uL   Basophils Relative 0 %   Basophils Absolute 0.0 0.0 - 0.1 K/uL   Immature Granulocytes 0 %   Abs Immature Granulocytes 0.04 0.00 - 0.07 K/uL    Comment: Performed at [facility], [address] [place], [place] [address]  Brain natriuretic peptide     Status: None   Collection Time: 11/20/21  7:58 PM  Result Value Ref Range   B Natriuretic Peptide 35.4 0.0 - 100.0 pg/mL    Comment: Performed at [facility], [place],  [address]  Troponin I (High Sensitivity)     Status: Abnormal   Collection Time: 11/20/21  9:56 PM  Result Value Ref Range   Troponin I (High Sensitivity) 178 (HH) <18 ng/L    Comment: CRITICAL VALUE NOTED. VALUE IS CONSISTENT WITH PREVIOUSLY REPORTED/CALLED VALUE SKL (NOTE) Elevated high sensitivity troponin I (hsTnI) values and significant  changes across serial measurements may suggest ACS but many other  chronic and acute conditions are known to elevate hsTnI results.  Refer to the "Links" section for chest pain algorithms and additional  guidance. Performed at [facility], [address] [place],  [place] [address]   APTT     Status: None   Collection Time: 11/20/21  9:56 PM  Result Value Ref Range   aPTT 32 24 - 36 seconds    Comment: Performed at [facility], [address] [place], [place] [address]  Protime-INR     Status: None   Collection Time: 11/20/21  9:56 PM  Result Value Ref Range   Prothrombin Time 13.7 11.4 - 15.2 seconds   INR 1.1 0.8 - 1.2    Comment: (NOTE) INR goal varies based on device and disease states. Performed at [facility], [place], [place] [address]   Lipoprotein A (LPA)     Status: None   Collection Time: 11/21/21  5:48 AM  Result Value Ref Range   Lipoprotein (a) 20.7 <75.0 nmol/L    Comment: (NOTE) Note:  Values greater than or equal to 75.0 nmol/L may       indicate an independent risk factor for CHD,       but must be evaluated with caution when applied       to non-Caucasian populations due to the       influence of genetic factors on Lp(a) across       ethnicities. Performed At: [facility] [place], [place] [id number] Rush Farmer MD [id number]   Basic metabolic panel     Status: Abnormal   Collection Time: 11/21/21  5:48 AM  Result Value Ref Range   Sodium 135 135 - 145 mmol/L   Potassium 3.9 3.5 - 5.1 mmol/L   Chloride 107 98 - 111 mmol/L   CO2 22 22 - 32 mmol/L   Glucose, Bld 162 (H) 70 - 99 mg/dL    Comment: Glucose reference range applies only to samples taken after fasting for at least 8 hours.   BUN 23 8 - 23 mg/dL   Creatinine, Ser 0.96 0.61 - 1.24 mg/dL   Calcium 8.9 8.9 - 10.3 mg/dL   GFR, Estimated >60 >60 mL/min    Comment: (NOTE) Calculated using the CKD-EPI Creatinine Equation (2021)    Anion gap 6 5 - 15    Comment: Performed at [facility], [address] [place], [place] [address]  Lipid panel     Status: Abnormal   Collection Time: 11/21/21  5:48 AM  Result Value Ref Range   Cholesterol 174 0 - 200 mg/dL   Triglycerides 197 (H) <150 mg/dL   HDL 27 (L) >40 mg/dL   Total CHOL/HDL Ratio 6.4 RATIO   VLDL 39 0 - 40 mg/dL   LDL Cholesterol 108 (H) 0 - 99 mg/dL    Comment:        Total Cholesterol/HDL:CHD Risk Coronary Heart Disease Risk Table                     Men   Women  1/2 Average Risk   3.4   3.3  Average Risk       5.0   4.4  2 X Average Risk   9.6   7.1  3 X Average Risk  23.4   11.0        Use the calculated Patient Ratio above and the CHD Risk Table to determine the patient's CHD Risk.        ATP III CLASSIFICATION (LDL):  <100     mg/dL   Optimal  100-129  mg/dL   Near or Above  Optimal  130-159  mg/dL   Borderline  160-189  mg/dL   High  >190     mg/dL   Very High Performed at [facility], [address] [place], [place] [address]   CBC     Status: None   Collection Time: 11/21/21  5:48 AM  Result Value Ref Range   WBC 9.1 4.0 - 10.5 K/uL   RBC 4.60 4.22 - 5.81 MIL/uL   Hemoglobin 13.1 13.0 - 17.0 g/dL   HCT 39.9 39.0 - 52.0 %   MCV 86.7 80.0 -  100.0 fL   MCH 28.5 26.0 - 34.0 pg   MCHC 32.8 30.0 - 36.0 g/dL   RDW 14.3 11.5 - 15.5 %   Platelets 231 150 - 400 K/uL   nRBC 0.0 0.0 - 0.2 %    Comment: Performed at [facility], [address] [place], [place] [address]  Protime-INR     Status: None   Collection Time: 11/21/21  5:48 AM  Result Value Ref Range   Prothrombin Time 14.1 11.4 - 15.2 seconds   INR 1.1 0.8 - 1.2    Comment: (NOTE) INR goal varies based on device and disease states. Performed at [facility], [place], [place] [address]   Heparin level (unfractionated)     Status: Abnormal   Collection Time: 11/21/21  5:48 AM  Result Value Ref Range   Heparin Unfractionated <0.10 (L) 0.30 - 0.70 IU/mL    Comment: (NOTE) The clinical reportable range upper limit is being lowered to >1.10 to align with the FDA approved guidance for the current laboratory assay.  If heparin results are below expected values, and patient dosage has  been confirmed, suggest follow up testing of antithrombin III levels. Performed at [facility], [address] [place], [place] [address]   Hemoglobin A1c     Status: Abnormal   Collection Time: 11/21/21  5:48 AM  Result Value Ref Range   Hgb A1c MFr Bld 7.3 (H) 4.8 - 5.6 %    Comment: (NOTE) Pre diabetes:          5.7%-6.4%  Diabetes:              >6.4%  Glycemic control for   <7.0% adults with diabetes    Mean Plasma Glucose 162.81 mg/dL    Comment: Performed at [facility] [address], [place], [place] [address]  CBG monitoring, ED     Status: Abnormal   Collection Time: 11/21/21  9:30 AM  Result Value Ref Range   Glucose-Capillary 160 (H) 70 - 99 mg/dL    Comment: Glucose reference range applies only to samples taken after fasting for at least 8 hours.  Glucose, capillary     Status: Abnormal   Collection Time: 11/21/21 11:26 AM  Result Value Ref Range   Glucose-Capillary 141 (H) 70 - 99 mg/dL    Comment: Glucose  reference range applies only to samples taken after fasting for at least 8 hours.  Glucose, capillary     Status: Abnormal   Collection Time: 11/21/21  3:52 PM  Result Value Ref Range   Glucose-Capillary 128 (H) 70 - 99 mg/dL    Comment: Glucose reference range applies only to samples taken after fasting for at least 8 hours.  Glucose, capillary     Status: Abnormal   Collection Time: 11/21/21  5:44 PM  Result Value Ref Range   Glucose-Capillary 170 (H) 70 -  99 mg/dL    Comment: Glucose reference range applies only to samples taken after fasting for at least 8 hours.  Glucose, capillary     Status: Abnormal   Collection Time: 11/21/21  8:32 PM  Result Value Ref Range   Glucose-Capillary 200 (H) 70 - 99 mg/dL    Comment: Glucose reference range applies only to samples taken after fasting for at least 8 hours.  ECHOCARDIOGRAM COMPLETE     Status: None   Collection Time: 11/21/21  9:01 PM  Result Value Ref Range   Weight 3,408 oz   Height 67 in   BP 132/78 mmHg   S' Lateral 2.50 cm   Area-P 1/2 3.60 cm2  Heparin level (unfractionated)     Status: Abnormal   Collection Time: 11/22/21  1:51 AM  Result Value Ref Range   Heparin Unfractionated 0.10 (L) 0.30 - 0.70 IU/mL    Comment: (NOTE) The clinical reportable range upper limit is being lowered to >1.10 to align with the FDA approved guidance for the current laboratory assay.  If heparin results are below expected values, and patient dosage has  been confirmed, suggest follow up testing of antithrombin III levels. Performed at [facility], [address] [place], [place] [address]   Basic metabolic panel     Status: Abnormal   Collection Time: 11/22/21  4:25 AM  Result Value Ref Range   Sodium 138 135 - 145 mmol/L   Potassium 4.3 3.5 - 5.1 mmol/L   Chloride 107 98 - 111 mmol/L   CO2 22 22 - 32 mmol/L   Glucose, Bld 153 (H) 70 - 99 mg/dL    Comment: Glucose reference range applies only to samples taken after  fasting for at least 8 hours.   BUN 16 8 - 23 mg/dL   Creatinine, Ser 0.99 0.61 - 1.24 mg/dL   Calcium 8.9 8.9 - 10.3 mg/dL   GFR, Estimated >60 >60 mL/min    Comment: (NOTE) Calculated using the CKD-EPI Creatinine Equation (2021)    Anion gap 9 5 - 15    Comment: Performed at [facility], [address] [place], [place] [address]  CBC     Status: Abnormal   Collection Time: 11/22/21  4:25 AM  Result Value Ref Range   WBC 9.3 4.0 - 10.5 K/uL   RBC 4.45 4.22 - 5.81 MIL/uL   Hemoglobin 12.7 (L) 13.0 - 17.0 g/dL   HCT 38.3 (L) 39.0 - 52.0 %   MCV 86.1 80.0 - 100.0 fL   MCH 28.5 26.0 - 34.0 pg   MCHC 33.2 30.0 - 36.0 g/dL   RDW 14.1 11.5 - 15.5 %   Platelets 251 150 - 400 K/uL   nRBC 0.0 0.0 - 0.2 %    Comment: Performed at [facility], [address] [place], [place] [address]  Glucose, capillary     Status: Abnormal   Collection Time: 11/22/21  7:41 AM  Result Value Ref Range   Glucose-Capillary 161 (H) 70 - 99 mg/dL    Comment: Glucose reference range applies only to samples taken after fasting for at least 8 hours.  Glucose, capillary     Status: Abnormal   Collection Time: 11/22/21 11:30 AM  Result Value Ref Range   Glucose-Capillary 161 (H) 70 - 99 mg/dL    Comment: Glucose reference range applies only to samples taken after fasting for at least 8 hours.  Heparin level (unfractionated)     Status: Abnormal  Collection Time: 11/22/21 11:47 AM  Result Value Ref Range   Heparin Unfractionated 0.25 (L) 0.30 - 0.70 IU/mL    Comment: (NOTE) The clinical reportable range upper limit is being lowered to >1.10 to align with the FDA approved guidance for the current laboratory assay.  If heparin results are below expected values, and patient dosage has  been confirmed, suggest follow up testing of antithrombin III levels. Performed at [facility], [address] [place], [place] [address]   Glucose, capillary     Status: Abnormal    Collection Time: 11/22/21  4:36 PM  Result Value Ref Range   Glucose-Capillary 180 (H) 70 - 99 mg/dL    Comment: Glucose reference range applies only to samples taken after fasting for at least 8 hours.  Glucose, capillary     Status: Abnormal   Collection Time: 11/22/21  9:19 PM  Result Value Ref Range   Glucose-Capillary 210 (H) 70 - 99 mg/dL    Comment: Glucose reference range applies only to samples taken after fasting for at least 8 hours.  Heparin level (unfractionated)     Status: None   Collection Time: 11/22/21 10:52 PM  Result Value Ref Range   Heparin Unfractionated 0.34 0.30 - 0.70 IU/mL    Comment: (NOTE) The clinical reportable range upper limit is being lowered to >1.10 to align with the FDA approved guidance for the current laboratory assay.  If heparin results are below expected values, and patient dosage has  been confirmed, suggest follow up testing of antithrombin III levels. Performed at [facility], [place] [address], [place], [place] [address]   Glucose, capillary     Status: Abnormal   Collection Time: 11/23/21  8:10 AM  Result Value Ref Range   Glucose-Capillary 188 (H) 70 - 99 mg/dL    Comment: Glucose reference range applies only to samples taken after fasting for at least 8 hours.  Heparin level (unfractionated)     Status: Abnormal   Collection Time: 11/23/21  9:10 AM  Result Value Ref Range   Heparin Unfractionated 0.27 (L) 0.30 - 0.70 IU/mL    Comment: (NOTE) The clinical reportable range upper limit is being lowered to >1.10 to align with the FDA approved guidance for the current laboratory assay.  If heparin results are below expected values, and patient dosage has  been confirmed, suggest follow up testing of antithrombin III levels. Performed at [facility], [place] [address] [place] [address]   CBC     Status: None   Collection Time: 11/23/21  9:10 AM  Result Value Ref Range   WBC 8.6 4.0 - 10.5 K/uL   RBC 4.51 4.22 -  5.81 MIL/uL   Hemoglobin 13.2 13.0 - 17.0 g/dL   HCT 39.0 39.0 - 52.0 %   MCV 86.5 80.0 - 100.0 fL   MCH 29.3 26.0 - 34.0 pg   MCHC 33.8 30.0 - 36.0 g/dL   RDW 14.3 11.5 - 15.5 %   Platelets 250 150 - 400 K/uL   nRBC 0.0 0.0 - 0.2 %    Comment: Performed at [facility], [place] [address] [place], [place] [address]  Basic metabolic panel     Status: Abnormal   Collection Time: 11/23/21  9:10 AM  Result Value Ref Range   Sodium 136 135 - 145 mmol/L   Potassium 4.0 3.5 - 5.1 mmol/L   Chloride 104 98 - 111 mmol/L   CO2 22 22 -  32 mmol/L   Glucose, Bld 182 (H) 70 - 99 mg/dL    Comment: Glucose reference range applies only to samples taken after fasting for at least 8 hours.   BUN 13 8 - 23 mg/dL   Creatinine, Ser 1.15 0.61 - 1.24 mg/dL   Calcium 9.2 8.9 - 10.3 mg/dL   GFR, Estimated >60 >60 mL/min    Comment: (NOTE) Calculated using the CKD-EPI Creatinine Equation (2021)    Anion gap 10 5 - 15    Comment: Performed at [facility] [address] [place], [place] [address]  Glucose, capillary     Status: Abnormal   Collection Time: 11/23/21 12:12 PM  Result Value Ref Range   Glucose-Capillary 127 (H) 70 - 99 mg/dL    Comment: Glucose reference range applies only to samples taken after fasting for at least 8 hours.  Type and screen [place]     Status: None   Collection Time: 11/23/21  4:46 PM  Result Value Ref Range   ABO/RH(D) O POS    Antibody Screen NEG    Sample Expiration      11/26/2021,2359 Performed at [facility], [place] [address], [place], [place] [address]   Glucose, capillary     Status: Abnormal   Collection Time: 11/23/21  4:48 PM  Result Value Ref Range   Glucose-Capillary 133 (H) 70 - 99 mg/dL    Comment: Glucose reference range applies only to samples taken after fasting for at least 8 hours.  Blood gas, arterial     Status: Abnormal   Collection Time: 11/23/21  6:10 PM  Result Value Ref Range   pH, Arterial 7.46 (H)  7.35 - 7.45   pCO2 arterial 32 32 - 48 mmHg   pO2, Arterial 73 (L) 83 - 108 mmHg   Bicarbonate 22.8 20.0 - 28.0 mmol/L   Acid-base deficit 0.3 0.0 - 2.0 mmol/L   O2 Saturation 98.1 %   Patient temperature 37.0    Collection site LEFT RADIAL    Allens test (pass/fail) PASS PASS    Comment: Performed at [facility], [place] [address], [place], [place] [address]  Urinalysis, Routine w reflex microscopic Nasal Mucosa     Status: Abnormal   Collection Time: 11/23/21  6:31 PM  Result Value Ref Range   Color, Urine YELLOW YELLOW   APPearance CLEAR CLEAR   Specific Gravity, Urine 1.017 1.005 - 1.030   pH 5.0 5.0 - 8.0   Glucose, UA NEGATIVE NEGATIVE mg/dL   Hgb urine dipstick NEGATIVE NEGATIVE   Bilirubin Urine NEGATIVE NEGATIVE   Ketones, ur NEGATIVE NEGATIVE mg/dL   Protein, ur NEGATIVE NEGATIVE mg/dL   Nitrite NEGATIVE NEGATIVE   Leukocytes,Ua MODERATE (A) NEGATIVE   RBC / HPF 0-5 0 - 5 RBC/hpf   WBC, UA 6-10 0 - 5 WBC/hpf   Bacteria, UA RARE (A) NONE SEEN   Squamous Epithelial / LPF 0-5 0 - 5   Mucus PRESENT     Comment: Performed at [facility], [place] [address] [place], [place] [address]  Surgical pcr screen     Status: None   Collection Time: 11/23/21  6:31 PM   Specimen: Nasal Mucosa; Nasal Swab  Result Value Ref Range   MRSA, PCR NEGATIVE NEGATIVE   Staphylococcus aureus NEGATIVE NEGATIVE    Comment: (NOTE) The Xpert SA Assay (FDA approved for NASAL specimens in patients 32 years of age and older), is one component  of a comprehensive surveillance program. It is not intended to diagnose infection nor to guide or monitor treatment. Performed at [facility], [place] [address], [place], [place] [address]   Heparin level (unfractionated)     Status: None   Collection Time: 11/23/21  6:57 PM  Result Value Ref Range   Heparin Unfractionated 0.33 0.30 - 0.70 IU/mL    Comment: (NOTE) The clinical reportable range upper limit is being lowered to >1.10 to align  with the FDA approved guidance for the current laboratory assay.  If heparin results are below expected values, and patient dosage has  been confirmed, suggest follow up testing of antithrombin III levels. Performed at [facility], [place] [address] [place], [place] [address]   Glucose, capillary     Status: Abnormal   Collection Time: 11/23/21  9:30 PM  Result Value Ref Range   Glucose-Capillary 143 (H) 70 - 99 mg/dL    Comment: Glucose reference range applies only to samples taken after fasting for at least 8 hours.  Heparin level (unfractionated)     Status: None   Collection Time: 11/24/21  5:39 AM  Result Value Ref Range   Heparin Unfractionated 0.58 0.30 - 0.70 IU/mL    Comment: (NOTE) The clinical reportable range upper limit is being lowered to >1.10 to align with the FDA approved guidance for the current laboratory assay.  If heparin results are below expected values, and patient dosage has  been confirmed, suggest follow up testing of antithrombin III levels. Performed at [facility], [place] [address] [place] [address]   CBC     Status: None   Collection Time: 11/24/21  5:39 AM  Result Value Ref Range   WBC 9.5 4.0 - 10.5 K/uL   RBC 4.83 4.22 - 5.81 MIL/uL   Hemoglobin 14.3 13.0 - 17.0 g/dL   HCT 41.2 39.0 - 52.0 %   MCV 85.3 80.0 - 100.0 fL   MCH 29.6 26.0 - 34.0 pg   MCHC 34.7 30.0 - 36.0 g/dL   RDW 14.3 11.5 - 15.5 %   Platelets 249 150 - 400 K/uL   nRBC 0.0 0.0 - 0.2 %    Comment: Performed at [facility], [place] [address] [place], [place] [address]  Basic metabolic panel     Status: Abnormal   Collection Time: 11/24/21  5:39 AM  Result Value Ref Range   Sodium 136 135 - 145 mmol/L   Potassium 4.1 3.5 - 5.1 mmol/L   Chloride 106 98 - 111 mmol/L   CO2 21 (L) 22 - 32 mmol/L   Glucose, Bld 160 (H) 70 - 99 mg/dL    Comment: Glucose reference range applies only to samples taken after fasting for at least 8 hours.   BUN 17 8 - 23  mg/dL   Creatinine, Ser 1.12 0.61 - 1.24 mg/dL   Calcium 9.6 8.9 - 10.3 mg/dL   GFR, Estimated >60 >60 mL/min    Comment: (NOTE) Calculated using the CKD-EPI Creatinine Equation (2021)    Anion gap 9 5 - 15    Comment: Performed at [facility] [address], [place], [place] [address]  ECHO INTRAOPERATIVE TEE     Status: None   Collection Time: 11/24/21  7:23 AM  Result Value Ref Range   Weight 3,380.8 oz   Height 67 in   BP 125/84 mmHg  I-STAT 7, (LYTES, BLD GAS, ICA, H+H)  Status: Abnormal   Collection Time: 11/24/21  8:18 AM  Result Value Ref Range   pH, Arterial 7.324 (L) 7.35 - 7.45   pCO2 arterial 41.7 32 - 48 mmHg   pO2, Arterial 137 (H) 83 - 108 mmHg   Bicarbonate 21.7 20.0 - 28.0 mmol/L   TCO2 23 22 - 32 mmol/L   O2 Saturation 99 %   Acid-base deficit 4.0 (H) 0.0 - 2.0 mmol/L   Sodium 136 135 - 145 mmol/L   Potassium 4.5 3.5 - 5.1 mmol/L   Calcium, Ion 1.28 1.15 - 1.40 mmol/L   HCT 36.0 (L) 39.0 - 52.0 %   Hemoglobin 12.2 (L) 13.0 - 17.0 g/dL   Sample type ARTERIAL   I-STAT, chem 8     Status: Abnormal   Collection Time: 11/24/21  8:22 AM  Result Value Ref Range   Sodium 136 135 - 145 mmol/L   Potassium 4.5 3.5 - 5.1 mmol/L   Chloride 104 98 - 111 mmol/L   BUN 15 8 - 23 mg/dL   Creatinine, Ser 1.00 0.61 - 1.24 mg/dL   Glucose, Bld 155 (H) 70 - 99 mg/dL    Comment: Glucose reference range applies only to samples taken after fasting for at least 8 hours.   Calcium, Ion 1.29 1.15 - 1.40 mmol/L   TCO2 26 22 - 32 mmol/L   Hemoglobin 12.2 (L) 13.0 - 17.0 g/dL   HCT 36.0 (L) 39.0 - 52.0 %  I-STAT, chem 8     Status: Abnormal   Collection Time: 11/24/21  9:28 AM  Result Value Ref Range   Sodium 136 135 - 145 mmol/L   Potassium 4.4 3.5 - 5.1 mmol/L   Chloride 104 98 - 111 mmol/L   BUN 16 8 - 23 mg/dL   Creatinine, Ser 1.00 0.61 - 1.24 mg/dL   Glucose, Bld 144 (H) 70 - 99 mg/dL    Comment: Glucose reference range applies only to samples taken after  fasting for at least 8 hours.   Calcium, Ion 1.26 1.15 - 1.40 mmol/L   TCO2 23 22 - 32 mmol/L   Hemoglobin 12.6 (L) 13.0 - 17.0 g/dL   HCT 37.0 (L) 39.0 - 52.0 %  I-STAT 7, (LYTES, BLD GAS, ICA, H+H)     Status: Abnormal   Collection Time: 11/24/21  9:46 AM  Result Value Ref Range   pH, Arterial 7.334 (L) 7.35 - 7.45   pCO2 arterial 49.0 (H) 32 - 48 mmHg   pO2, Arterial 438 (H) 83 - 108 mmHg   Bicarbonate 26.1 20.0 - 28.0 mmol/L   TCO2 28 22 - 32 mmol/L   O2 Saturation 100 %   Acid-Base Excess 0.0 0.0 - 2.0 mmol/L   Sodium 138 135 - 145 mmol/L   Potassium 4.8 3.5 - 5.1 mmol/L   Calcium, Ion 1.05 (L) 1.15 - 1.40 mmol/L   HCT 28.0 (L) 39.0 - 52.0 %   Hemoglobin 9.5 (L) 13.0 - 17.0 g/dL   Sample type ARTERIAL   POCT I-Stat EG7     Status: Abnormal   Collection Time: 11/24/21  9:50 AM  Result Value Ref Range   pH, Ven 7.318 7.25 - 7.43   pCO2, Ven 48.7 44 - 60 mmHg   pO2, Ven 48 (H) 32 - 45 mmHg   Bicarbonate 24.9 20.0 - 28.0 mmol/L   TCO2 26 22 - 32 mmol/L   O2 Saturation 79 %   Acid-base deficit 1.0 0.0 - 2.0 mmol/L  Sodium 138 135 - 145 mmol/L   Potassium 4.6 3.5 - 5.1 mmol/L   Calcium, Ion 1.11 (L) 1.15 - 1.40 mmol/L   HCT 28.0 (L) 39.0 - 52.0 %   Hemoglobin 9.5 (L) 13.0 - 17.0 g/dL   Sample type VENOUS   I-STAT, chem 8     Status: Abnormal   Collection Time: 11/24/21 10:10 AM  Result Value Ref Range   Sodium 136 135 - 145 mmol/L   Potassium 5.1 3.5 - 5.1 mmol/L   Chloride 104 98 - 111 mmol/L   BUN 14 8 - 23 mg/dL   Creatinine, Ser 0.80 0.61 - 1.24 mg/dL   Glucose, Bld 129 (H) 70 - 99 mg/dL    Comment: Glucose reference range applies only to samples taken after fasting for at least 8 hours.   Calcium, Ion 1.14 (L) 1.15 - 1.40 mmol/L   TCO2 24 22 - 32 mmol/L   Hemoglobin 10.5 (L) 13.0 - 17.0 g/dL   HCT 31.0 (L) 39.0 - 52.0 %  I-STAT 7, (LYTES, BLD GAS, ICA, H+H)     Status: Abnormal   Collection Time: 11/24/21 10:34 AM  Result Value Ref Range   pH, Arterial 7.383  7.35 - 7.45   pCO2 arterial 39.7 32 - 48 mmHg   pO2, Arterial 370 (H) 83 - 108 mmHg   Bicarbonate 23.6 20.0 - 28.0 mmol/L   TCO2 25 22 - 32 mmol/L   O2 Saturation 100 %   Acid-base deficit 1.0 0.0 - 2.0 mmol/L   Sodium 135 135 - 145 mmol/L   Potassium 5.3 (H) 3.5 - 5.1 mmol/L   Calcium, Ion 1.11 (L) 1.15 - 1.40 mmol/L   HCT 28.0 (L) 39.0 - 52.0 %   Hemoglobin 9.5 (L) 13.0 - 17.0 g/dL   Sample type ARTERIAL   Hemoglobin and hematocrit, blood     Status: Abnormal   Collection Time: 11/24/21 10:37 AM  Result Value Ref Range   Hemoglobin 9.8 (L) 13.0 - 17.0 g/dL    Comment: REPEATED TO VERIFY READ BACK AND VERIFIED WITH SARA ROBERTS RN AT 1045 [id number] BY ZBEECH    HCT 28.1 (L) 39.0 - 52.0 %    Comment: Performed at [facility], [place] [address], [place], [place] [address]  Platelet count     Status: None   Collection Time: 11/24/21 10:37 AM  Result Value Ref Range   Platelets 225 150 - 400 K/uL    Comment: Performed at [facility], [place] [address] [place], [place] [address]  I-STAT, [place] Clap 8     Status: Abnormal   Collection Time: 11/24/21 10:56 AM  Result Value Ref Range   Sodium 138 135 - 145 mmol/L   Potassium 5.0 3.5 - 5.1 mmol/L   Chloride 102 98 - 111 mmol/L   BUN 15 8 - 23 mg/dL   Creatinine, Ser 0.80 0.61 - 1.24 mg/dL   Glucose, Bld 142 (H) 70 - 99 mg/dL    Comment: Glucose reference range applies only to samples taken after fasting for at least 8 hours.   Calcium, Ion 1.11 (L) 1.15 - 1.40 mmol/L   TCO2 24 22 - 32 mmol/L   Hemoglobin 10.2 (L) 13.0 - 17.0 g/dL   HCT 30.0 (L) 39.0 - 52.0 %  I-STAT 7, (LYTES, BLD GAS, ICA, H+H)     Status: Abnormal   Collection Time: 11/24/21 11:34 AM  Result Value Ref Range   pH, Arterial 7.317 (L) 7.35 - 7.45  pCO2 arterial 47.5 32 - 48 mmHg   pO2, Arterial 296 (H) 83 - 108 mmHg   Bicarbonate 24.3 20.0 - 28.0 mmol/L   TCO2 26 22 - 32 mmol/L   O2 Saturation 100 %   Acid-base deficit 2.0 0.0 - 2.0 mmol/L   Sodium 136  135 - 145 mmol/L   Potassium 4.5 3.5 - 5.1 mmol/L   Calcium, Ion 1.12 (L) 1.15 - 1.40 mmol/L   HCT 28.0 (L) 39.0 - 52.0 %   Hemoglobin 9.5 (L) 13.0 - 17.0 g/dL   Sample type ARTERIAL   I-STAT, chem 8     Status: Abnormal   Collection Time: 11/24/21 11:40 AM  Result Value Ref Range   Sodium 137 135 - 145 mmol/L   Potassium 4.5 3.5 - 5.1 mmol/L   Chloride 103 98 - 111 mmol/L   BUN 13 8 - 23 mg/dL   Creatinine, Ser 0.80 0.61 - 1.24 mg/dL   Glucose, Bld 176 (H) 70 - 99 mg/dL    Comment: Glucose reference range applies only to samples taken after fasting for at least 8 hours.   Calcium, Ion 1.11 (L) 1.15 - 1.40 mmol/L   TCO2 24 22 - 32 mmol/L   Hemoglobin 11.6 (L) 13.0 - 17.0 g/dL   HCT 34.0 (L) 39.0 - 52.0 %  Glucose, capillary     Status: Abnormal   Collection Time: 11/24/21 12:49 PM  Result Value Ref Range   Glucose-Capillary 130 (H) 70 - 99 mg/dL    Comment: Glucose reference range applies only to samples taken after fasting for at least 8 hours.  I-STAT 7, (LYTES, BLD GAS, ICA, H+H)     Status: Abnormal   Collection Time: 11/24/21 12:51 PM  Result Value Ref Range   pH, Arterial 7.276 (L) 7.35 - 7.45   pCO2 arterial 48.3 (H) 32 - 48 mmHg   pO2, Arterial 112 (H) 83 - 108 mmHg   Bicarbonate 22.7 20.0 - 28.0 mmol/L   TCO2 24 22 - 32 mmol/L   O2 Saturation 98 %   Acid-base deficit 4.0 (H) 0.0 - 2.0 mmol/L   Sodium 138 135 - 145 mmol/L   Potassium 4.4 3.5 - 5.1 mmol/L   Calcium, Ion 1.17 1.15 - 1.40 mmol/L   HCT 31.0 (L) 39.0 - 52.0 %   Hemoglobin 10.5 (L) 13.0 - 17.0 g/dL   Patient temperature 36.0 C    Sample type ARTERIAL   CBC     Status: Abnormal   Collection Time: 11/24/21 12:59 PM  Result Value Ref Range   WBC 22.2 (H) 4.0 - 10.5 K/uL   RBC 3.44 (L) 4.22 - 5.81 MIL/uL   Hemoglobin 10.2 (L) 13.0 - 17.0 g/dL   HCT 30.4 (L) 39.0 - 52.0 %   MCV 88.4 80.0 - 100.0 fL   MCH 29.7 26.0 - 34.0 pg   MCHC 33.6 30.0 - 36.0 g/dL   RDW 14.4 11.5 - 15.5 %   Platelets 221 150 -  400 K/uL   nRBC 0.0 0.0 - 0.2 %    Comment: Performed at [facility], [place] [address] [place], [place] [address]  Protime-INR     Status: None   Collection Time: 11/24/21 12:59 PM  Result Value Ref Range   Prothrombin Time 15.0 11.4 - 15.2 seconds   INR 1.2 0.8 - 1.2    Comment: (NOTE) INR goal varies based on device and disease states. Performed at [facility], [place]  [address] [place], [place] [address]   APTT     Status: None   Collection Time: 11/24/21 12:59 PM  Result Value Ref Range   aPTT 33 24 - 36 seconds    Comment: Performed at [facility] [address], [place], [place] [address]  Glucose, capillary     Status: Abnormal   Collection Time: 11/24/21  1:54 PM  Result Value Ref Range   Glucose-Capillary 109 (H) 70 - 99 mg/dL    Comment: Glucose reference range applies only to samples taken after fasting for at least 8 hours.  I-STAT 7, (LYTES, BLD GAS, ICA, H+H)     Status: Abnormal   Collection Time: 11/24/21  1:56 PM  Result Value Ref Range   pH, Arterial 7.306 (L) 7.35 - 7.45   pCO2 arterial 43.0 32 - 48 mmHg   pO2, Arterial 146 (H) 83 - 108 mmHg   Bicarbonate 21.7 20.0 - 28.0 mmol/L   TCO2 23 22 - 32 mmol/L   O2 Saturation 99 %   Acid-base deficit 5.0 (H) 0.0 - 2.0 mmol/L   Sodium 138 135 - 145 mmol/L   Potassium 4.5 3.5 - 5.1 mmol/L   Calcium, Ion 1.18 1.15 - 1.40 mmol/L   HCT 29.0 (L) 39.0 - 52.0 %   Hemoglobin 9.9 (L) 13.0 - 17.0 g/dL   Patient temperature 36.0 C    Sample type ARTERIAL   Glucose, capillary     Status: Abnormal   Collection Time: 11/24/21  2:54 PM  Result Value Ref Range   Glucose-Capillary 136 (H) 70 - 99 mg/dL    Comment: Glucose reference range applies only to samples taken after fasting for at least 8 hours.  Glucose, capillary     Status: Abnormal   Collection Time: 11/24/21  4:00 PM  Result Value Ref Range   Glucose-Capillary 119 (H) 70 - 99 mg/dL    Comment: Glucose reference range applies only to samples  taken after fasting for at least 8 hours.  I-STAT 7, (LYTES, BLD GAS, ICA, H+H)     Status: Abnormal   Collection Time: 11/24/21  4:02 PM  Result Value Ref Range   pH, Arterial 7.375 7.35 - 7.45   pCO2 arterial 35.1 32 - 48 mmHg   pO2, Arterial 103 83 - 108 mmHg   Bicarbonate 20.7 20.0 - 28.0 mmol/L   TCO2 22 22 - 32 mmol/L   O2 Saturation 98 %   Acid-base deficit 4.0 (H) 0.0 - 2.0 mmol/L   Sodium 138 135 - 145 mmol/L   Potassium 4.7 3.5 - 5.1 mmol/L   Calcium, Ion 1.16 1.15 - 1.40 mmol/L   HCT 28.0 (L) 39.0 - 52.0 %   Hemoglobin 9.5 (L) 13.0 - 17.0 g/dL   Patient temperature 36.4 C    Sample type ARTERIAL   Glucose, capillary     Status: Abnormal   Collection Time: 11/24/21  4:57 PM  Result Value Ref Range   Glucose-Capillary 120 (H) 70 - 99 mg/dL    Comment: Glucose reference range applies only to samples taken after fasting for at least 8 hours.  I-STAT 7, (LYTES, BLD GAS, ICA, H+H)     Status: Abnormal   Collection Time: 11/24/21  4:58 PM  Result Value Ref Range   pH, Arterial 7.340 (L) 7.35 - 7.45   pCO2 arterial 43.0 32 - 48 mmHg   pO2, Arterial 55 (L) 83 - 108 mmHg   Bicarbonate 23.3 20.0 - 28.0 mmol/L  TCO2 25 22 - 32 mmol/L   O2 Saturation 87 %   Acid-base deficit 3.0 (H) 0.0 - 2.0 mmol/L   Sodium 138 135 - 145 mmol/L   Potassium 5.2 (H) 3.5 - 5.1 mmol/L   Calcium, Ion 1.16 1.15 - 1.40 mmol/L   HCT 28.0 (L) 39.0 - 52.0 %   Hemoglobin 9.5 (L) 13.0 - 17.0 g/dL   Patient temperature 36.5 C    Sample type ARTERIAL   CBC     Status: Abnormal   Collection Time: 11/24/21  6:43 PM  Result Value Ref Range   WBC 15.2 (H) 4.0 - 10.5 K/uL   RBC 3.28 (L) 4.22 - 5.81 MIL/uL   Hemoglobin 9.6 (L) 13.0 - 17.0 g/dL   HCT 29.4 (L) 39.0 - 52.0 %   MCV 89.6 80.0 - 100.0 fL   MCH 29.3 26.0 - 34.0 pg   MCHC 32.7 30.0 - 36.0 g/dL   RDW 14.5 11.5 - 15.5 %   Platelets 212 150 - 400 K/uL   nRBC 0.0 0.0 - 0.2 %    Comment: Performed at [facility], [place] [address]  [place], [place] [address]  Basic metabolic panel     Status: Abnormal   Collection Time: 11/24/21  6:43 PM  Result Value Ref Range   Sodium 137 135 - 145 mmol/L   Potassium 4.4 3.5 - 5.1 mmol/L   Chloride 108 98 - 111 mmol/L   CO2 21 (L) 22 - 32 mmol/L   Glucose, Bld 134 (H) 70 - 99 mg/dL    Comment: Glucose reference range applies only to samples taken after fasting for at least 8 hours.   BUN 14 8 - 23 mg/dL   Creatinine, Ser 1.04 0.61 - 1.24 mg/dL   Calcium 8.0 (L) 8.9 - 10.3 mg/dL   GFR, Estimated >60 >60 mL/min    Comment: (NOTE) Calculated using the CKD-EPI Creatinine Equation (2021)    Anion gap 8 5 - 15    Comment: Performed at [facility] [address], [place], [place] [address]  Magnesium     Status: Abnormal   Collection Time: 11/24/21  6:43 PM  Result Value Ref Range   Magnesium 3.1 (H) 1.7 - 2.4 mg/dL    Comment: Performed at [facility] [address], [place], [place] [address]  Glucose, capillary     Status: Abnormal   Collection Time: 11/24/21  6:43 PM  Result Value Ref Range   Glucose-Capillary 121 (H) 70 - 99 mg/dL    Comment: Glucose reference range applies only to samples taken after fasting for at least 8 hours.  Glucose, capillary     Status: Abnormal   Collection Time: 11/24/21  8:35 PM  Result Value Ref Range   Glucose-Capillary 153 (H) 70 - 99 mg/dL    Comment: Glucose reference range applies only to samples taken after fasting for at least 8 hours.  Glucose, capillary     Status: Abnormal   Collection Time: 11/24/21  9:32 PM  Result Value Ref Range   Glucose-Capillary 134 (H) 70 - 99 mg/dL    Comment: Glucose reference range applies only to samples taken after fasting for at least 8 hours.  Glucose, capillary     Status: Abnormal   Collection Time: 11/24/21 10:32 PM  Result Value Ref Range   Glucose-Capillary 137 (H) 70 - 99 mg/dL    Comment: Glucose reference range applies only to samples taken after fasting for  at least 8 hours.   Glucose, capillary     Status: Abnormal   Collection Time: 11/24/21 11:28 PM  Result Value Ref Range   Glucose-Capillary 119 (H) 70 - 99 mg/dL    Comment: Glucose reference range applies only to samples taken after fasting for at least 8 hours.  Glucose, capillary     Status: Abnormal   Collection Time: 11/25/21  1:26 AM  Result Value Ref Range   Glucose-Capillary 112 (H) 70 - 99 mg/dL    Comment: Glucose reference range applies only to samples taken after fasting for at least 8 hours.  Glucose, capillary     Status: Abnormal   Collection Time: 11/25/21  3:16 AM  Result Value Ref Range   Glucose-Capillary 104 (H) 70 - 99 mg/dL    Comment: Glucose reference range applies only to samples taken after fasting for at least 8 hours.  CBC     Status: Abnormal   Collection Time: 11/25/21  3:17 AM  Result Value Ref Range   WBC 16.5 (H) 4.0 - 10.5 K/uL   RBC 3.22 (L) 4.22 - 5.81 MIL/uL   Hemoglobin 9.4 (L) 13.0 - 17.0 g/dL   HCT 28.7 (L) 39.0 - 52.0 %   MCV 89.1 80.0 - 100.0 fL   MCH 29.2 26.0 - 34.0 pg   MCHC 32.8 30.0 - 36.0 g/dL   RDW 14.6 11.5 - 15.5 %   Platelets 264 150 - 400 K/uL   nRBC 0.0 0.0 - 0.2 %    Comment: Performed at [facility], [place] [address] [place], [place] [address]  Basic metabolic panel     Status: Abnormal   Collection Time: 11/25/21  3:17 AM  Result Value Ref Range   Sodium 136 135 - 145 mmol/L   Potassium 4.3 3.5 - 5.1 mmol/L   Chloride 109 98 - 111 mmol/L   CO2 21 (L) 22 - 32 mmol/L   Glucose, Bld 104 (H) 70 - 99 mg/dL    Comment: Glucose reference range applies only to samples taken after fasting for at least 8 hours.   BUN 14 8 - 23 mg/dL   Creatinine, Ser 1.00 0.61 - 1.24 mg/dL   Calcium 8.2 (L) 8.9 - 10.3 mg/dL   GFR, Estimated >60 >60 mL/min    Comment: (NOTE) Calculated using the CKD-EPI Creatinine Equation (2021)    Anion gap 6 5 - 15    Comment: Performed at [facility] [address] [place], [place] [address]  Magnesium      Status: Abnormal   Collection Time: 11/25/21  3:17 AM  Result Value Ref Range   Magnesium 2.7 (H) 1.7 - 2.4 mg/dL    Comment: Performed at [facility] [address], [place], [place] [address]  Glucose, capillary     Status: Abnormal   Collection Time: 11/25/21  4:45 AM  Result Value Ref Range   Glucose-Capillary 115 (H) 70 - 99 mg/dL    Comment: Glucose reference range applies only to samples taken after fasting for at least 8 hours.  Glucose, capillary     Status: Abnormal   Collection Time: 11/25/21  5:58 AM  Result Value Ref Range   Glucose-Capillary 121 (H) 70 - 99 mg/dL    Comment: Glucose reference range applies only to samples taken after fasting for at least 8 hours.  Glucose, capillary     Status: Abnormal   Collection Time: 11/25/21  6:58 AM  Result Value  Ref Range   Glucose-Capillary 126 (H) 70 - 99 mg/dL    Comment: Glucose reference range applies only to samples taken after fasting for at least 8 hours.  Glucose, capillary     Status: Abnormal   Collection Time: 11/25/21  9:00 AM  Result Value Ref Range   Glucose-Capillary 126 (H) 70 - 99 mg/dL    Comment: Glucose reference range applies only to samples taken after fasting for at least 8 hours.  Glucose, capillary     Status: None   Collection Time: 11/25/21 11:04 AM  Result Value Ref Range   Glucose-Capillary 97 70 - 99 mg/dL    Comment: Glucose reference range applies only to samples taken after fasting for at least 8 hours.  Glucose, capillary     Status: Abnormal   Collection Time: 11/25/21 12:00 PM  Result Value Ref Range   Glucose-Capillary 131 (H) 70 - 99 mg/dL    Comment: Glucose reference range applies only to samples taken after fasting for at least 8 hours.  Glucose, capillary     Status: Abnormal   Collection Time: 11/25/21  1:02 PM  Result Value Ref Range   Glucose-Capillary 151 (H) 70 - 99 mg/dL    Comment: Glucose reference range applies only to samples taken after fasting for at least 8  hours.  Glucose, capillary     Status: Abnormal   Collection Time: 11/25/21  4:04 PM  Result Value Ref Range   Glucose-Capillary 153 (H) 70 - 99 mg/dL    Comment: Glucose reference range applies only to samples taken after fasting for at least 8 hours.  Basic metabolic panel     Status: Abnormal   Collection Time: 11/25/21  4:12 PM  Result Value Ref Range   Sodium 134 (L) 135 - 145 mmol/L   Potassium 4.0 3.5 - 5.1 mmol/L   Chloride 106 98 - 111 mmol/L   CO2 22 22 - 32 mmol/L   Glucose, Bld 154 (H) 70 - 99 mg/dL    Comment: Glucose reference range applies only to samples taken after fasting for at least 8 hours.   BUN 15 8 - 23 mg/dL   Creatinine, Ser 1.22 0.61 - 1.24 mg/dL   Calcium 7.8 (L) 8.9 - 10.3 mg/dL   GFR, Estimated >60 >60 mL/min    Comment: (NOTE) Calculated using the CKD-EPI Creatinine Equation (2021)    Anion gap 6 5 - 15    Comment: Performed at [facility] [address], [place], [place] [address]  Magnesium     Status: None   Collection Time: 11/25/21  4:12 PM  Result Value Ref Range   Magnesium 2.4 1.7 - 2.4 mg/dL    Comment: Performed at [facility] [address], [place], [place] [address]  CBC     Status: Abnormal   Collection Time: 11/25/21  4:12 PM  Result Value Ref Range   WBC 12.7 (H) 4.0 - 10.5 K/uL   RBC 3.13 (L) 4.22 - 5.81 MIL/uL   Hemoglobin 9.4 (L) 13.0 - 17.0 g/dL   HCT 28.2 (L) 39.0 - 52.0 %   MCV 90.1 80.0 - 100.0 fL   MCH 30.0 26.0 - 34.0 pg   MCHC 33.3 30.0 - 36.0 g/dL   RDW 15.1 11.5 - 15.5 %   Platelets 197 150 - 400 K/uL   nRBC 0.0 0.0 - 0.2 %    Comment: Performed at [facility], [place],  [place] [address]  Glucose, capillary     Status: Abnormal   Collection Time: 11/25/21  8:39 PM  Result Value Ref Range   Glucose-Capillary 176 (H) 70 - 99 mg/dL    Comment: Glucose reference range applies only to samples taken after fasting for at least 8 hours.  Glucose, capillary     Status: Abnormal    Collection Time: 11/26/21 12:09 AM  Result Value Ref Range   Glucose-Capillary 153 (H) 70 - 99 mg/dL    Comment: Glucose reference range applies only to samples taken after fasting for at least 8 hours.  Basic metabolic panel     Status: Abnormal   Collection Time: 11/26/21  4:02 AM  Result Value Ref Range   Sodium 135 135 - 145 mmol/L   Potassium 4.1 3.5 - 5.1 mmol/L   Chloride 104 98 - 111 mmol/L   CO2 24 22 - 32 mmol/L   Glucose, Bld 146 (H) 70 - 99 mg/dL    Comment: Glucose reference range applies only to samples taken after fasting for at least 8 hours.   BUN 14 8 - 23 mg/dL   Creatinine, Ser 1.03 0.61 - 1.24 mg/dL   Calcium 8.6 (L) 8.9 - 10.3 mg/dL   GFR, Estimated >60 >60 mL/min    Comment: (NOTE) Calculated using the CKD-EPI Creatinine Equation (2021)    Anion gap 7 5 - 15    Comment: Performed at [facility] [address] [place], [place] [address]  CBC     Status: Abnormal   Collection Time: 11/26/21  4:02 AM  Result Value Ref Range   WBC 12.8 (H) 4.0 - 10.5 K/uL   RBC 2.95 (L) 4.22 - 5.81 MIL/uL   Hemoglobin 8.7 (L) 13.0 - 17.0 g/dL   HCT 26.9 (L) 39.0 - 52.0 %   MCV 91.2 80.0 - 100.0 fL   MCH 29.5 26.0 - 34.0 pg   MCHC 32.3 30.0 - 36.0 g/dL   RDW 15.2 11.5 - 15.5 %   Platelets 183 150 - 400 K/uL   nRBC 0.0 0.0 - 0.2 %    Comment: Performed at [facility], [place] [address], [place], [place] [address]  Glucose, capillary     Status: Abnormal   Collection Time: 11/26/21  4:05 AM  Result Value Ref Range   Glucose-Capillary 141 (H) 70 - 99 mg/dL    Comment: Glucose reference range applies only to samples taken after fasting for at least 8 hours.  Glucose, capillary     Status: Abnormal   Collection Time: 11/26/21  8:04 AM  Result Value Ref Range   Glucose-Capillary 202 (H) 70 - 99 mg/dL    Comment: Glucose reference range applies only to samples taken after fasting for at least 8 hours.  Glucose, capillary     Status: Abnormal   Collection Time:  11/26/21 11:53 AM  Result Value Ref Range   Glucose-Capillary 150 (H) 70 - 99 mg/dL    Comment: Glucose reference range applies only to samples taken after fasting for at least 8 hours.  Glucose, capillary     Status: Abnormal   Collection Time: 11/26/21  3:43 PM  Result Value Ref Range   Glucose-Capillary 156 (H) 70 - 99 mg/dL    Comment: Glucose reference range applies only to samples taken after fasting for at least 8 hours.  Glucose, capillary     Status: Abnormal   Collection Time: 11/26/21  9:45 PM  Result Value Ref  Range   Glucose-Capillary 123 (H) 70 - 99 mg/dL    Comment: Glucose reference range applies only to samples taken after fasting for at least 8 hours.  CBC     Status: Abnormal   Collection Time: 11/27/21  6:02 AM  Result Value Ref Range   WBC 10.8 (H) 4.0 - 10.5 K/uL   RBC 3.00 (L) 4.22 - 5.81 MIL/uL   Hemoglobin 9.0 (L) 13.0 - 17.0 g/dL   HCT 26.7 (L) 39.0 - 52.0 %   MCV 89.0 80.0 - 100.0 fL   MCH 30.0 26.0 - 34.0 pg   MCHC 33.7 30.0 - 36.0 g/dL   RDW 15.4 11.5 - 15.5 %   Platelets 200 150 - 400 K/uL   nRBC 0.0 0.0 - 0.2 %    Comment: Performed at [facility], [place] [address], [place], [place] [address]  Basic metabolic panel     Status: Abnormal   Collection Time: 11/27/21  6:02 AM  Result Value Ref Range   Sodium 136 135 - 145 mmol/L   Potassium 3.4 (L) 3.5 - 5.1 mmol/L   Chloride 102 98 - 111 mmol/L   CO2 23 22 - 32 mmol/L   Glucose, Bld 112 (H) 70 - 99 mg/dL    Comment: Glucose reference range applies only to samples taken after fasting for at least 8 hours.   BUN 21 8 - 23 mg/dL   Creatinine, Ser 1.08 0.61 - 1.24 mg/dL   Calcium 8.7 (L) 8.9 - 10.3 mg/dL   GFR, Estimated >60 >60 mL/min    Comment: (NOTE) Calculated using the CKD-EPI Creatinine Equation (2021)    Anion gap 11 5 - 15    Comment: Performed at [facility] [address] [place], [place] [address]  Glucose, capillary     Status: Abnormal   Collection Time: 11/27/21  6:40  AM  Result Value Ref Range   Glucose-Capillary 116 (H) 70 - 99 mg/dL    Comment: Glucose reference range applies only to samples taken after fasting for at least 8 hours.  Glucose, capillary     Status: Abnormal   Collection Time: 11/27/21 11:30 AM  Result Value Ref Range   Glucose-Capillary 135 (H) 70 - 99 mg/dL    Comment: Glucose reference range applies only to samples taken after fasting for at least 8 hours.  Glucose, capillary     Status: Abnormal   Collection Time: 11/27/21  4:10 PM  Result Value Ref Range   Glucose-Capillary 130 (H) 70 - 99 mg/dL    Comment: Glucose reference range applies only to samples taken after fasting for at least 8 hours.  Glucose, capillary     Status: Abnormal   Collection Time: 11/27/21 10:08 PM  Result Value Ref Range   Glucose-Capillary 111 (H) 70 - 99 mg/dL    Comment: Glucose reference range applies only to samples taken after fasting for at least 8 hours.  CBC     Status: Abnormal   Collection Time: 11/28/21  3:05 AM  Result Value Ref Range   WBC 10.1 4.0 - 10.5 K/uL   RBC 3.22 (L) 4.22 - 5.81 MIL/uL   Hemoglobin 9.4 (L) 13.0 - 17.0 g/dL   HCT 28.1 (L) 39.0 - 52.0 %   MCV 87.3 80.0 - 100.0 fL   MCH 29.2 26.0 - 34.0 pg   MCHC 33.5 30.0 - 36.0 g/dL   RDW 15.5 11.5 - 15.5 %   Platelets 255 150 -  400 K/uL   nRBC 0.0 0.0 - 0.2 %    Comment: Performed at [facility], [place] [address] [place], [place] [address]  Basic metabolic panel     Status: Abnormal   Collection Time: 11/28/21  3:05 AM  Result Value Ref Range   Sodium 133 (L) 135 - 145 mmol/L   Potassium 3.7 3.5 - 5.1 mmol/L   Chloride 98 98 - 111 mmol/L   CO2 24 22 - 32 mmol/L   Glucose, Bld 115 (H) 70 - 99 mg/dL    Comment: Glucose reference range applies only to samples taken after fasting for at least 8 hours.   BUN 20 8 - 23 mg/dL   Creatinine, Ser 0.98 0.61 - 1.24 mg/dL   Calcium 9.0 8.9 - 10.3 mg/dL   GFR, Estimated >60 >60 mL/min    Comment: (NOTE) Calculated using  the CKD-EPI Creatinine Equation (2021)    Anion gap 11 5 - 15    Comment: Performed at [facility] [address], [place], [place] [address]  Glucose, capillary     Status: Abnormal   Collection Time: 11/28/21  6:53 AM  Result Value Ref Range   Glucose-Capillary 143 (H) 70 - 99 mg/dL    Comment: Glucose reference range applies only to samples taken after fasting for at least 8 hours.  Glucose, capillary     Status: Abnormal   Collection Time: 11/28/21 11:57 AM  Result Value Ref Range   Glucose-Capillary 109 (H) 70 - 99 mg/dL    Comment: Glucose reference range applies only to samples taken after fasting for at least 8 hours.  Glucose, capillary     Status: Abnormal   Collection Time: 11/28/21  5:22 PM  Result Value Ref Range   Glucose-Capillary 155 (H) 70 - 99 mg/dL    Comment: Glucose reference range applies only to samples taken after fasting for at least 8 hours.  Glucose, capillary     Status: Abnormal   Collection Time: 11/28/21  9:04 PM  Result Value Ref Range   Glucose-Capillary 124 (H) 70 - 99 mg/dL    Comment: Glucose reference range applies only to samples taken after fasting for at least 8 hours.   Comment 1 Notify RN    Comment 2 Document in Chart   Basic metabolic panel     Status: Abnormal   Collection Time: 11/29/21  1:26 AM  Result Value Ref Range   Sodium 134 (L) 135 - 145 mmol/L   Potassium 3.9 3.5 - 5.1 mmol/L   Chloride 95 (L) 98 - 111 mmol/L   CO2 26 22 - 32 mmol/L   Glucose, Bld 134 (H) 70 - 99 mg/dL    Comment: Glucose reference range applies only to samples taken after fasting for at least 8 hours.   BUN 21 8 - 23 mg/dL   Creatinine, Ser 1.05 0.61 - 1.24 mg/dL   Calcium 9.4 8.9 - 10.3 mg/dL   GFR, Estimated >60 >60 mL/min    Comment: (NOTE) Calculated using the CKD-EPI Creatinine Equation (2021)    Anion gap 13 5 - 15    Comment: Performed at [facility] [address], [place],  [address]  Glucose, capillary     Status:  Abnormal   Collection Time: 11/29/21  6:06 AM  Result Value Ref Range   Glucose-Capillary 148 (H) 70 - 99 mg/dL    Comment: Glucose reference range applies only to samples taken after fasting for  at least 8 hours.   Comment 1 Notify RN    Comment 2 Document in Chart   Glucose, capillary     Status: Abnormal   Collection Time: 12/25/21 10:59 AM  Result Value Ref Range   Glucose-Capillary 140 (H) 70 - 99 mg/dL    Comment: Glucose reference range applies only to samples taken after fasting for at least 8 hours.  Glucose, capillary     Status: Abnormal   Collection Time: 12/27/21 10:24 AM  Result Value Ref Range   Glucose-Capillary 136 (H) 70 - 99 mg/dL    Comment: Glucose reference range applies only to samples taken after fasting for at least 8 hours.  Glucose, capillary     Status: None   Collection Time: 12/27/21 11:57 AM  Result Value Ref Range   Glucose-Capillary 92 70 - 99 mg/dL    Comment: Glucose reference range applies only to samples taken after fasting for at least 8 hours.  Glucose, capillary     Status: Abnormal   Collection Time: 12/29/21 10:57 AM  Result Value Ref Range   Glucose-Capillary 184 (H) 70 - 99 mg/dL    Comment: Glucose reference range applies only to samples taken after fasting for at least 8 hours.  Glucose, capillary     Status: Abnormal   Collection Time: 12/29/21 12:02 PM  Result Value Ref Range   Glucose-Capillary 141 (H) 70 - 99 mg/dL    Comment: Glucose reference range applies only to samples taken after fasting for at least 8 hours.    Assessment/Plan:  Essential hypertension blood pressure control important in reducing the progression of atherosclerotic disease. On appropriate oral medications.   Diabetes mellitus with stage 2 chronic kidney disease (HCC) blood glucose control important in reducing the progression of atherosclerotic disease. Also, involved in wound healing. On appropriate medications.   Dyslipidemia lipid control important  in reducing the progression of atherosclerotic disease. Continue statin therapy   Pain in limb His ABIs showed no significant lower extremity arterial insufficiency.   Aortic atherosclerosis (HCC) His CT scan showed some scattered atherosclerotic disease of the aorta and the chest and upper abdomen without severe narrowing or aneurysmal degeneration in the visualized portions.  The abdominal portion was not seen entirely on the CT scan with her previous first-degree relative with an aneurysm and has multiple other issues, we will do an aortic duplex at his next visit.  Carotid artery stenosis, asymptomatic, bilateral He had carotid duplex velocity criteria which would fall in the 80 to 99% range on the right in the 60 to 79% range on the left.   The patient remains asymptomatic with respect to the carotid stenosis.  However, the patient has now progressed and has a lesion the is >70%.  Patient should undergo CT angiography of the carotid arteries to define the degree of stenosis of the internal carotid arteries bilaterally and the anatomic suitability for surgery vs. intervention.  If the patient does indeed need surgery cardiac clearance will be required, once cleared the patient will be scheduled for surgery.  The risks, benefits and alternative therapies were reviewed in detail with the patient.  All questions were answered.  The patient agrees to proceed with imaging.  Continue antiplatelet therapy as prescribed. Continue management of CAD, HTN and Hyperlipidemia. Healthy heart diet, encouraged exercise at least 4 times per week.       Leotis Pain 01/23/2022, 11:43 AM   This note was created with Dragon medical transcription system.  Any errors  from dictation are unintentional.

## 2022-01-23 NOTE — Assessment & Plan Note (Signed)
His ABIs showed no significant lower extremity arterial insufficiency.

## 2022-01-23 NOTE — Assessment & Plan Note (Signed)
He had carotid duplex velocity criteria which would fall in the 80 to 99% range on the right in the 60 to 79% range on the left.   The patient remains asymptomatic with respect to the carotid stenosis.  However, the patient has now progressed and has a lesion the is >70%.  Patient should undergo CT angiography of the carotid arteries to define the degree of stenosis of the internal carotid arteries bilaterally and the anatomic suitability for surgery vs. intervention.  If the patient does indeed need surgery cardiac clearance will be required, once cleared the patient will be scheduled for surgery.  The risks, benefits and alternative therapies were reviewed in detail with the patient.  All questions were answered.  The patient agrees to proceed with imaging.  Continue antiplatelet therapy as prescribed. Continue management of CAD, HTN and Hyperlipidemia. Healthy heart diet, encouraged exercise at least 4 times per week.

## 2022-01-23 NOTE — Assessment & Plan Note (Signed)
blood glucose control important in reducing the progression of atherosclerotic disease. Also, involved in wound healing. On appropriate medications.  

## 2022-01-23 NOTE — Patient Instructions (Signed)
Carotid Artery Disease  The carotid arteries are the two main blood vessels on either side of the neck. They supply blood to the brain, other parts of the head, and the neck. Carotid artery disease is the narrowing or blockage of one or both carotid arteries. This condition is also called carotid artery stenosis. Carotid artery disease increases your risk for a stroke or a transient ischemic attack (TIA). A TIA is a "mini-stroke" that causes stroke-like symptoms that then go away quickly. What are the causes? This condition is mainly caused by a narrowing and hardening of the carotid arteries (atherosclerosis). The carotid arteries can become narrow or clogged with a buildup of fat, cholesterol, calcium, and other substances (plaque). What increases the risk? The following factors may make you more likely to develop this condition: Having certain medical conditions, such as: High cholesterol. High blood pressure (hypertension). Diabetes. Obesity. Smoking. A family history of cardiovascular disease. Not being active or not exercising regularly. Being male. People who are male have an increased risk of developing atherosclerosis earlier in life than people who are male. Being male and older than 72 years old. Being male and older than 72 years old. What are the signs or symptoms? This condition may not have any signs or symptoms until a stroke or TIA occurs. In some cases, your health care provider may be able to hear a whooshing sound (bruit) with a stethoscope. This can mean that there a change in blood flow caused by plaque buildup. How is this diagnosed? This condition may be diagnosed with a physical exam, your medical history, and your family's medical history. You may also have tests that look at the blood flow in your carotid arteries, such as: Carotid artery ultrasound, which uses sound waves to create pictures to show if the arteries are narrow or blocked. Tests that use a dye  injected into a vein to highlight your arteries on images, such as: Carotid or cerebral angiography, which uses X-rays. Computerized tomographic angiography (CTA), which uses CT scans. Magnetic resonance angiography (MRA), which uses MRI. An eye exam can also help find signs of this condition. How is this treated? This condition may be treated with more than one treatment. Treatment may include: Lifestyle changes, such as: Quitting smoking. Exercising regularly as told by your health care provider. Eating a heart-healthy diet. Managing stress. Getting to and staying at a healthy weight. Medicines to control blood pressure, cholesterol, and blood clotting. Surgery. You may have: A carotid endarterectomy. This is a surgery to remove the blockages in the carotid arteries. A carotid angioplasty with stenting. This is a procedure in which a small mesh tube (stent) is used to widen the blocked carotid arteries. Follow these instructions at home: Eating and drinking Follow instructions from your health care provider about what you may eat and drink. It is important to: Eat a healthy diet that is low in saturated fats and includes plenty of fresh fruits, vegetables, and lean meats. Avoid foods that are high in fat and salt (sodium). Avoid foods that are fried, overly processed, or have poor nutritional value.  Lifestyle  Maintain a healthy weight. Do exercises as told by your health care provider. Each week you should get at least 150 minutes of moderate-intensity exercise or 75 minutes of vigorous exercise that takes a lot of effort. Do not use any products that contain nicotine or tobacco. These products include cigarettes, chewing tobacco, and vaping devices, such as e-cigarettes. If you need help quitting, ask your  health care provider. Do not drink alcohol if: Your health care provider tells you not to drink. You are pregnant, may be pregnant, or are planning to become pregnant. If you  drink alcohol: Limit how much you have to: 0-1 drink a day for women. 0-2 drinks a day for men. Know how much alcohol is in your drink. In the [place] one drink equals one 12 oz bottle of beer (355 mL), one 5 oz glass of wine (148 mL), or one 1 oz glass of hard liquor (44 mL). Do not use drugs. Manage your stress. Ask your health care provider for stress management tips. General instructions Take over-the-counter and prescription medicines only as told by your health care provider. Keep all follow-up visits. Your health care provider will monitor your condition and may need to change your treatment plan over time. Where to find more information American Heart Association: heart.org Get help right away if: You have any symptoms of a stroke. "BE FAST" is an easy way to remember the main warning signs of a stroke: B - Balance. Signs are dizziness, sudden trouble walking, or loss of balance. E - Eyes. Signs are trouble seeing or a sudden change in vision. F - Face. Signs are sudden weakness or numbness of the face, or the face or eyelid drooping on one side. A - Arms. Signs are weakness or numbness in an arm. This happens suddenly and usually on one side of the body. S - Speech. Signs are sudden trouble speaking, slurred speech, or trouble understanding what people say. T - Time. Time to call emergency services. Write down what time symptoms started. You have other signs of a stroke, such as: A sudden, severe headache with no known cause. Nausea or vomiting. Seizure. These symptoms may be an emergency. Get help right away. Call 911. Do not wait to see if the symptoms will go away. Do not drive yourself to the hospital. This information is not intended to replace advice given to you by your health care provider. Make sure you discuss any questions you have with your health care provider. Document Revised: 07/11/2021 Document Reviewed: 07/11/2021 Elsevier Patient Education  [place].

## 2022-01-23 NOTE — Assessment & Plan Note (Signed)
blood pressure control important in reducing the progression of atherosclerotic disease. On appropriate oral medications.  

## 2022-01-23 NOTE — Assessment & Plan Note (Signed)
His CT scan showed some scattered atherosclerotic disease of the aorta and the chest and upper abdomen without severe narrowing or aneurysmal degeneration in the visualized portions.  The abdominal portion was not seen entirely on the CT scan with her previous first-degree relative with an aneurysm and has multiple other issues, we will do an aortic duplex at his next visit.

## 2022-01-24 ENCOUNTER — Encounter: Payer: Medicare Other | Admitting: *Deleted

## 2022-01-24 ENCOUNTER — Encounter: Payer: Self-pay | Admitting: *Deleted

## 2022-01-24 DIAGNOSIS — I214 Non-ST elevation (NSTEMI) myocardial infarction: Secondary | ICD-10-CM

## 2022-01-24 DIAGNOSIS — Z951 Presence of aortocoronary bypass graft: Secondary | ICD-10-CM

## 2022-01-24 NOTE — Progress Notes (Signed)
Daily Session Note  Patient Details  Name: Samuel [name] MRN: [id number] Date of Birth: January 25, 1951 Referring Provider:   Flowsheet Row Cardiac Rehab from 12/21/2021 in [facility]  Referring Provider Isaias Cowman, MD       Encounter Date: 01/24/2022  Check In:  Session Check In - 01/24/22 1116       Check-In   Supervising physician immediately available to respond to emergencies See telemetry face sheet for immediately available ER MD    Location [facility]    Staff Present Renita Papa, RN BSN;Jessica Luan Pulling, MA, RCEP, CCRP, Mindi Curling, RN, Dimple Nanas, BS, Exercise Physiologist    Virtual Visit No    Medication changes reported     No    Fall or balance concerns reported    No    Warm-up and Cool-down Performed on first and last piece of equipment    Resistance Training Performed Yes    VAD Patient? No    PAD/SET Patient? No      Pain Assessment   Currently in Pain? No/denies                Social History   Tobacco Use  Smoking Status Never  Smokeless Tobacco Never    Goals Met:  [place] with exercise equipment Exercise tolerated well No report of concerns or symptoms today Strength training completed today  Goals Unmet:  Not Applicable  Comments: Pt able to follow exercise prescription today without complaint.  Will continue to monitor for progression.    Dr. Emily Filbert is Medical Director for [facility].  Dr. Ottie Glazier is Medical Director for [facility].

## 2022-01-24 NOTE — Progress Notes (Signed)
Cardiac Individual Treatment Plan  Patient Details  Name: Samuel [name] MRN: [id number] Date of Birth: 18-Mar-1950 Referring Provider:   Flowsheet Row Cardiac Rehab from 12/21/2021 in [facility] Cardiac and Pulmonary Rehab  Referring Provider Isaias Cowman, MD       Initial Encounter Date:  Flowsheet Row Cardiac Rehab from 12/21/2021 in [facility] Cardiac and Pulmonary Rehab  Date 12/21/21       Visit Diagnosis: S/P CABG x 3  NSTEMI (non-ST elevation myocardial infarction) [facility])  Patient's Home Medications on Admission:  Current Outpatient Medications:    acetaminophen (TYLENOL) 500 MG tablet, Take 1-2 tablets (500-1,000 mg total) by mouth every 6 (six) hours as needed., Disp: 30 tablet, Rfl: 0   amLODipine (NORVASC) 5 MG tablet, Take 5 mg by mouth daily., Disp: , Rfl:    aspirin 81 MG EC tablet, Take 81 mg by mouth daily. , Disp: , Rfl:    atorvastatin (LIPITOR) 80 MG tablet, Take 1 tablet (80 mg total) by mouth daily., Disp: 30 tablet, Rfl: 0   clopidogrel (PLAVIX) 75 MG tablet, Take 1 tablet (75 mg total) by mouth daily., Disp: 30 tablet, Rfl: 11   lisinopril-hydrochlorothiazide (ZESTORETIC) 20-25 MG tablet, Take 1 tablet by mouth daily., Disp: , Rfl:    loratadine (CLARITIN) 10 MG tablet, Take 10 mg by mouth daily., Disp: , Rfl:    metFORMIN (GLUCOPHAGE) 500 MG tablet, Take 1 tablet (500 mg total) by mouth 2 (two) times daily with a meal., Disp: , Rfl:    metoprolol tartrate (LOPRESSOR) 25 MG tablet, Take 0.5 tablets (12.5 mg total) by mouth 2 (two) times daily., Disp: 60 tablet, Rfl: 3   nitroGLYCERIN (NITROSTAT) 0.4 MG SL tablet, Place under the tongue., Disp: , Rfl:    omeprazole (PRILOSEC) 20 MG capsule, Take 20 mg by mouth daily before breakfast., Disp: , Rfl:    ONETOUCH ULTRA test strip, CHECK BLOOD SUGAR TWICE DAILY., Disp: , Rfl:    oxybutynin (DITROPAN-XL) 5 MG 24 hr tablet, Take 5 mg by mouth daily., Disp: , Rfl:    pravastatin (PRAVACHOL) 40 MG tablet, Take 40 mg by  mouth daily., Disp: , Rfl:   Past Medical History: Past Medical History:  Diagnosis Date   Anxiety    situational   Arthritis    Cancer ([place])    squamous cell carcinoma   ED (erectile dysfunction)    GERD (gastroesophageal reflux disease)    Hyperlipidemia    Hypertension    OSA (obstructive sleep apnea)    CPAP   Stroke ([place])    no residual    Uncontrolled type 2 diabetes mellitus with hyperglycemia, with long-term current use of insulin (HCC)     Tobacco Use: Social History   Tobacco Use  Smoking Status Never  Smokeless Tobacco Never    Labs: Review Flowsheet  More data may exist      Latest Ref Rng & Units 03/18/2019 11/03/2019 11/21/2021 11/23/2021 11/24/2021  Labs for ITP Cardiac and Pulmonary Rehab  Cholestrol 0 - 200 mg/dL - - 174  - -  LDL (calc) 0 - 99 mg/dL - - 108  - -  HDL-C >40 mg/dL - - 27  - -  Trlycerides <150 mg/dL - - 197  - -  Hemoglobin A1c 4.8 - 5.6 % 6.9  6.6  7.3  - -  PH, Arterial 7.35 - 7.45 - - - 7.46  7.340  7.375  7.306  7.276  7.317  7.383  7.334  7.324  PCO2 arterial 32 - 48 mmHg - - - 32  43.0  35.1  43.0  48.3  47.5  39.7  49.0  41.7   Bicarbonate 20.0 - 28.0 mmol/L - - - 22.8  23.3  20.7  21.7  22.7  24.3  23.6  24.9  26.1  21.7   TCO2 22 - 32 mmol/L - - - - _0 Acid-base deficit 0.0 - 2.0 mmol/L - - - 0.3  3.0  4.0  5.0  4.0  2.0  1.0  1.0  4.0   O2 Saturation % - - - 98.1  87  98  99  98  100  100  79  100  99      Exercise Target Goals: Exercise Program Goal: Individual exercise prescription set using results from initial 6 min walk test and THRR while considering  patient's activity barriers and safety.   Exercise Prescription Goal: Initial exercise prescription builds to 30-45 minutes a day of aerobic activity, 2-3 days per week.  Home exercise guidelines will be given to patient during program as part of exercise prescription that the participant will  acknowledge.   Education: Aerobic Exercise: - Group verbal and visual presentation on the components of exercise prescription. Introduces F.I.T.T principle from ACSM for exercise prescriptions.  Reviews F.I.T.T. principles of aerobic exercise including progression. Written material given at graduation. Flowsheet Row Cardiac Rehab from 01/17/2022 in [facility] Cardiac and Pulmonary Rehab  Date 01/03/22  Educator [facility]  Instruction Review Code 1- Verbalizes Understanding       Education: Resistance Exercise: - Group verbal and visual presentation on the components of exercise prescription. Introduces F.I.T.T principle from ACSM for exercise prescriptions  Reviews F.I.T.T. principles of resistance exercise including progression. Written material given at graduation. Flowsheet Row Cardiac Rehab from 01/17/2022 in [facility] Cardiac and Pulmonary Rehab  Date 01/17/22  Educator NT  Instruction Review Code 1- Verbalizes Understanding        Education: Exercise & Equipment Safety: - Individual verbal instruction and demonstration of equipment use and safety with use of the equipment.   Education: Exercise Physiology & General Exercise Guidelines: - Group verbal and written instruction with models to review the exercise physiology of the cardiovascular system and associated critical values. Provides general exercise guidelines with specific guidelines to those with heart or lung disease.  Flowsheet Row Cardiac Rehab from 01/17/2022 in [facility] Cardiac and Pulmonary Rehab  Date 12/27/21  Educator [facility]  Instruction Review Code 1- Verbalizes Understanding       Education: Flexibility, Balance, Mind/Body Relaxation: - Group verbal and visual presentation with interactive activity on the components of exercise prescription. Introduces F.I.T.T principle from ACSM for exercise prescriptions. Reviews F.I.T.T. principles of flexibility and balance exercise training including progression. Also discusses the mind body  connection.  Reviews various relaxation techniques to help reduce and manage stress (i.e. Deep breathing, progressive muscle relaxation, and visualization). Balance handout provided to take home. Written material given at graduation. Flowsheet Row Cardiac Rehab from 01/17/2022 in [facility] Cardiac and Pulmonary Rehab  Date 01/17/22  Educator NT  Instruction Review Code 1- Verbalizes Understanding       Activity Barriers & Risk Stratification:  Activity Barriers & Cardiac Risk Stratification - 12/21/21 1253       Activity Barriers & Cardiac Risk Stratification   Activity Barriers Left Knee  Replacement;Right Knee Replacement;Arthritis   arthritis in both arms limits ROM   Cardiac Risk Stratification High             6 Minute Walk:  6 Minute Walk     Row Name 12/21/21 1252         6 Minute Walk   Phase Initial     Distance 1235 feet     Walk Time 6 minutes     # of Rest Breaks 0     MPH 2.34     METS 2.49     RPE 11     Perceived Dyspnea  2     VO2 Peak 8.71     Symptoms Yes (comment)     Comments SOB     Resting HR 64 bpm     Resting BP 114/66     Resting Oxygen Saturation  98 %     Exercise Oxygen Saturation  during 6 min walk 97 %     Max Ex. HR 104 bpm     Max Ex. BP 130/80     2 Minute Post BP 122/76              Oxygen Initial Assessment:   Oxygen Re-Evaluation:   Oxygen Discharge (Final Oxygen Re-Evaluation):   Initial Exercise Prescription:  Initial Exercise Prescription - 12/21/21 1300       Date of Initial Exercise RX and Referring Provider   Date 12/21/21    Referring Provider Isaias Cowman, MD      Oxygen   Maintain Oxygen Saturation 88% or higher      Treadmill   MPH 2    Grade 0.5    Minutes 15    METs 2.67      NuStep   Level 2    SPM 80    Minutes 15    METs 2.49      REL-XR   Level 1    Speed 50    Minutes 15    METs 2.49      Prescription Details   Frequency (times per week) 3    Duration Progress to 30  minutes of continuous aerobic without signs/symptoms of physical distress      Intensity   THRR 40-80% of Max Heartrate 98-131    Ratings of Perceived Exertion 11-13    Perceived Dyspnea 0-4      Progression   Progression Continue to progress workloads to maintain intensity without signs/symptoms of physical distress.      Resistance Training   Training Prescription Yes    Weight 3 lb    Reps 10-15             Perform Capillary Blood Glucose checks as needed.  Exercise Prescription Changes:   Exercise Prescription Changes     Row Name 12/21/21 1300 01/04/22 1400 01/05/22 1100 01/17/22 1500       Response to Exercise   Blood Pressure (Admit) 114/66 112/66 -- 124/60    Blood Pressure (Exercise) 130/80 142/70 -- 150/64    Blood Pressure (Exit) 122/76 102/60 -- 126/64    Heart Rate (Admit) 64 bpm 91 bpm -- 90 bpm    Heart Rate (Exercise) 104 bpm 127 bpm -- 137 bpm    Heart Rate (Exit) 67 bpm 103 bpm -- 88 bpm    Oxygen Saturation (Admit) 98 % -- -- --    Oxygen Saturation (Exercise) 97 % -- -- --    Rating of Perceived Exertion (Exercise)  11 13 -- 15    Perceived Dyspnea (Exercise) 2 -- -- --    Symptoms SOB none -- none    Comments 6MWT Results First 3 day of Rehab -- --    Duration -- Progress to 30 minutes of  aerobic without signs/symptoms of physical distress -- Continue with 30 min of aerobic exercise without signs/symptoms of physical distress.    Intensity -- THRR unchanged -- THRR unchanged      Progression   Progression -- Continue to progress workloads to maintain intensity without signs/symptoms of physical distress. -- Continue to progress workloads to maintain intensity without signs/symptoms of physical distress.    Average METs -- 2.71 -- 3.46      Resistance Training   Training Prescription -- Yes -- Yes    Weight -- 3 lb -- 3 lb    Reps -- 10-15 -- 10-15      Interval Training   Interval Training -- No -- No      Treadmill   MPH -- 2.5 -- 2.8     Grade -- 1 -- 3.5    Minutes -- 15 -- 15    METs -- 3.26 -- 4.49      NuStep   Level -- 2 -- 5    Minutes -- 15 -- 15    METs -- 2.3 -- 2.7      REL-XR   Level -- 5 -- 6    Minutes -- 15 -- 15    METs -- 3.1 -- 0      Home Exercise Plan   Plans to continue exercise at -- -- Home (comment)  walking Home (comment)  walking    Frequency -- -- Add 2 additional days to program exercise sessions. Add 2 additional days to program exercise sessions.    Initial Home Exercises Provided -- -- 01/05/22 01/05/22      Oxygen   Maintain Oxygen Saturation -- 88% or higher -- 88% or higher             Exercise Comments:   Exercise Comments     Row Name 12/25/21 1125           Exercise Comments First full day of exercise!  Patient was oriented to gym and equipment including functions, settings, policies, and procedures.  Patient's individual exercise prescription and treatment plan were reviewed.  All starting workloads were established based on the results of the 6 minute walk test done at initial orientation visit.  The plan for exercise progression was also introduced and progression will be customized based on patient's performance and goals.                Exercise Goals and Review:   Exercise Goals     Row Name 12/21/21 1305             Exercise Goals   Increase Physical Activity Yes       Intervention Provide advice, education, support and counseling about physical activity/exercise needs.;Develop an individualized exercise prescription for aerobic and resistive training based on initial evaluation findings, risk stratification, comorbidities and participant's personal goals.       Expected Outcomes Short Term: Attend rehab on a regular basis to increase amount of physical activity.;Long Term: Exercising regularly at least 3-5 days a week.;Long Term: Add in home exercise to make exercise part of routine and to increase amount of physical activity.       Increase  Strength and Stamina Yes  Intervention Provide advice, education, support and counseling about physical activity/exercise needs.;Develop an individualized exercise prescription for aerobic and resistive training based on initial evaluation findings, risk stratification, comorbidities and participant's personal goals.       Expected Outcomes Short Term: Increase workloads from initial exercise prescription for resistance, speed, and METs.;Short Term: Perform resistance training exercises routinely during rehab and add in resistance training at home;Long Term: Improve cardiorespiratory fitness, muscular endurance and strength as measured by increased METs and functional capacity (6MWT)       Able to understand and use rate of perceived exertion (RPE) scale Yes       Intervention Provide education and explanation on how to use RPE scale       Expected Outcomes Short Term: Able to use RPE daily in rehab to express subjective intensity level;Long Term:  Able to use RPE to guide intensity level when exercising independently       Able to understand and use Dyspnea scale Yes       Intervention Provide education and explanation on how to use Dyspnea scale       Expected Outcomes Long Term: Able to use Dyspnea scale to guide intensity level when exercising independently;Short Term: Able to use Dyspnea scale daily in rehab to express subjective sense of shortness of breath during exertion       Knowledge and understanding of Target Heart Rate Range (THRR) Yes       Intervention Provide education and explanation of THRR including how the numbers were predicted and where they are located for reference       Expected Outcomes Short Term: Able to state/look up THRR;Long Term: Able to use THRR to govern intensity when exercising independently;Short Term: Able to use daily as guideline for intensity in rehab       Able to check pulse independently Yes       Intervention Provide education and demonstration on how  to check pulse in carotid and radial arteries.;Review the importance of being able to check your own pulse for safety during independent exercise       Expected Outcomes Short Term: Able to explain why pulse checking is important during independent exercise;Long Term: Able to check pulse independently and accurately       Understanding of Exercise Prescription Yes       Intervention Provide education, explanation, and written materials on patient's individual exercise prescription       Expected Outcomes Short Term: Able to explain program exercise prescription;Long Term: Able to explain home exercise prescription to exercise independently                Exercise Goals Re-Evaluation :  Exercise Goals Re-Evaluation     Row Name 12/25/21 1125 01/04/22 1437 01/05/22 1114 01/17/22 1558       Exercise Goal Re-Evaluation   Exercise Goals Review Able to understand and use rate of perceived exertion (RPE) scale;Able to understand and use Dyspnea scale;Knowledge and understanding of Target Heart Rate Range (THRR);Understanding of Exercise Prescription Understanding of Exercise Prescription;Increase Physical Activity;Increase Strength and Stamina Understanding of Exercise Prescription;Increase Physical Activity;Able to understand and use rate of perceived exertion (RPE) scale;Knowledge and understanding of Target Heart Rate Range (THRR);Able to understand and use Dyspnea scale;Increase Strength and Stamina;Able to check pulse independently Understanding of Exercise Prescription;Increase Physical Activity;Increase Strength and Stamina    Comments Reviewed RPE scale, THR and program prescription with pt today.  Pt voiced understanding and was given a copy of goals to take  home. Samuel [name] is off to a good start in rehab. He worked at an average MET level of 2.71 METs during his first three sessions. He also was able to work up to level 5 on the XR. He increased his workload on the treadmill to a speed of 2.5 mph  and an incline of 1% as well. We will continue to monitor his progress in the program. Reviewed home exercise with pt today.  Pt plans to walk at home for exercise.  Reviewed THR, pulse, RPE, sign and symptoms, pulse oximetery and when to call 911 or MD.  Also discussed weather considerations and indoor options.  Pt voiced understanding. Samuel [name] continues to do well in rehab. He recently increased his overall average MET level to 3.46 METs. He also increased his speed on the treadmill to 2.8 mph and his incline to 3.5%. He has consistently hit his THR on the treadmill as well. He improved to level 6 on the XR also. We will continue to monitor his progress in the program.    Expected Outcomes Short: Use RPE daily to regulate intensity.  Long: Follow program prescription in THR. Short: Continue to increase workloads as tolerated. Long: Continue to increase strength and stamina. Short: Start to add in walking at home consistently Long: Continue to improve stamina Short: Continue to increase workloads as tolerated  Long: Continue to increase strength and stamina.             Discharge Exercise Prescription (Final Exercise Prescription Changes):  Exercise Prescription Changes - 01/17/22 1500       Response to Exercise   Blood Pressure (Admit) 124/60    Blood Pressure (Exercise) 150/64    Blood Pressure (Exit) 126/64    Heart Rate (Admit) 90 bpm    Heart Rate (Exercise) 137 bpm    Heart Rate (Exit) 88 bpm    Rating of Perceived Exertion (Exercise) 15    Symptoms none    Duration Continue with 30 min of aerobic exercise without signs/symptoms of physical distress.    Intensity THRR unchanged      Progression   Progression Continue to progress workloads to maintain intensity without signs/symptoms of physical distress.    Average METs 3.46      Resistance Training   Training Prescription Yes    Weight 3 lb    Reps 10-15      Interval Training   Interval Training No      Treadmill   MPH  2.8    Grade 3.5    Minutes 15    METs 4.49      NuStep   Level 5    Minutes 15    METs 2.7      REL-XR   Level 6    Minutes 15    METs 0      Home Exercise Plan   Plans to continue exercise at Home (comment)   walking   Frequency Add 2 additional days to program exercise sessions.    Initial Home Exercises Provided 01/05/22      Oxygen   Maintain Oxygen Saturation 88% or higher             Nutrition:  Target Goals: Understanding of nutrition guidelines, daily intake of sodium <1598m, cholesterol <2062m calories 30% from fat and 7% or less from saturated fats, daily to have 5 or more servings of fruits and vegetables.  Education: All About Nutrition: -Group instruction provided by verbal, written material, interactive activities, discussions, models,  and posters to present general guidelines for heart healthy nutrition including fat, fiber, MyPlate, the role of sodium in heart healthy nutrition, utilization of the nutrition label, and utilization of this knowledge for meal planning. Follow up email sent as well. Written material given at graduation. Flowsheet Row Cardiac Rehab from 01/17/2022 in [facility] Cardiac and Pulmonary Rehab  Date 01/10/22  Educator [facility]  Instruction Review Code 1- Verbalizes Understanding       Biometrics:  Pre Biometrics - 12/21/21 1306       Pre Biometrics   Height 5' 7.5" (1.715 m)    Weight 203 lb 1.6 oz (92.1 kg)    Waist Circumference 43 inches    Hip Circumference 42.5 inches    Waist to Hip Ratio 1.01 %    BMI (Calculated) 31.32    Single Leg Stand 7.5 seconds   R             Nutrition Therapy Plan and Nutrition Goals:  Nutrition Therapy & Goals - 12/21/21 0914       Nutrition Therapy   Diet Heart healthy, low Na, T2DM MNT    Drug/Food Interactions Statins/Certain Fruits    Protein (specify units) 85-95g    Fiber 31 grams    Whole Grain Foods 3 servings    Saturated Fats 17 max. grams    Fruits and Vegetables 8  servings/day    Sodium 2 grams      Personal Nutrition Goals   Nutrition Goal ST: practice adding volume, fiber, protein, and healthy fats to meals with MyPlate as well as review paperwork LT: achieve and maintain A1C <7, at least 31g of fiber/day, <17g saturated fat/day, follow MyPlate guidelines    Comments 71 y.o. M admitted to cardiac rehab s/p NSTEMI and CABG x3. PMHx includes HTN, HLD, T2DM, stg 3a CKD, OSA, RBBB, GERD. PSHx Left and right TKA 2021. Relevant medications includes lipitor, furosemide, metformin, omeprazole. Samuel [name] and [name] reports making changes recently to both of their diets after his diabetes diagnosis and after speaking with the doctor about his heart health. Food recall: Coffee (zero sugar creamer) B: 1 glass of 1% milk or tomato juice with breakfast. 1 egg from 2 eggs with 1.5 pieces of sausage or bacon and grits (no longer putting sugar in it) or cheerios (honey nut) or oatmeal with some butter and honey L: sandwich (whole wheat) with ham and cheese with olive oil and vinegar dressing and mustard or light mayo D: vaires: wife will cook dinner. Lean ground [place] with spaghetti (they have switched to ground [place] and chicken mostly for protein - grilling or baking normally). he likes pinto beans, peas, carrots, turnip greens, bell peppers, and onions. Drinks: lemonade (crystal lite), sometimes small coke zero. Occassionally will have a beer or 2. Discussed heart healthy eating and MyPlate guidelines as well as considerations for BG management and T2DM. his wife is interested in mixing cauliflower rice with regular rice and trying lentils.      Intervention Plan   Intervention Prescribe, educate and counsel regarding individualized specific dietary modifications aiming towards targeted core components such as weight, hypertension, lipid management, diabetes, heart failure and other comorbidities.;Nutrition handout(s) given to patient.    Expected Outcomes Short Term  Goal: Understand basic principles of dietary content, such as calories, fat, sodium, cholesterol and nutrients.;Short Term Goal: A plan has been developed with personal nutrition goals set during dietitian appointment.;Long Term Goal: Adherence to prescribed nutrition plan.  Nutrition Assessments:  MEDIFICTS Score Key: ?70 Need to make dietary changes  40-70 Heart Healthy Diet ? 40 Therapeutic Level Cholesterol Diet  Flowsheet Row Cardiac Rehab from 12/25/2021 in [facility] Cardiac and Pulmonary Rehab  Picture Your Plate Total Score on Admission 55      Picture Your Plate Scores: <35 Unhealthy dietary pattern with much room for improvement. 41-50 Dietary pattern unlikely to meet recommendations for good health and room for improvement. 51-60 More healthful dietary pattern, with some room for improvement.  >60 Healthy dietary pattern, although there may be some specific behaviors that could be improved.    Nutrition Goals Re-Evaluation:  Nutrition Goals Re-Evaluation     [place] Name 01/05/22 1119             Goals   Current Weight 201 lb (91.2 kg)       Nutrition Goal Control portion size.       Comment Samuel [name] wants to lose weight but feels like he eats to much. Informed him to use a smaller plate and watch his portion sizes He is going to try to use smaller plates to help with portion controls.       Expected Outcome Short: use smaller plate when eating. Long: lose more weight.                Nutrition Goals Discharge (Final Nutrition Goals Re-Evaluation):  Nutrition Goals Re-Evaluation - 01/05/22 1119       Goals   Current Weight 201 lb (91.2 kg)    Nutrition Goal Control portion size.    Comment Samuel [name] wants to lose weight but feels like he eats to much. Informed him to use a smaller plate and watch his portion sizes He is going to try to use smaller plates to help with portion controls.    Expected Outcome Short: use smaller plate when eating. Long: lose  more weight.             Psychosocial: Target Goals: Acknowledge presence or absence of significant depression and/or stress, maximize coping skills, provide positive support system. Participant is able to verbalize types and ability to use techniques and skills needed for reducing stress and depression.   Education: Stress, Anxiety, and Depression - Group verbal and visual presentation to define topics covered.  Reviews how body is impacted by stress, anxiety, and depression.  Also discusses healthy ways to reduce stress and to treat/manage anxiety and depression.  Written material given at graduation.   Education: Sleep Hygiene -Provides group verbal and written instruction about how sleep can affect your health.  Define sleep hygiene, discuss sleep cycles and impact of sleep habits. Review good sleep hygiene tips.    Initial Review & Psychosocial Screening:  Initial Psych Review & Screening - 12/14/21 1342       Initial Review   Current issues with None Identified      Family Dynamics   Good Support System? Yes   wife  married 34 years     Barriers   Psychosocial barriers to participate in program There are no identifiable barriers or psychosocial needs.      Screening Interventions   Interventions Encouraged to exercise;To provide support and resources with identified psychosocial needs;Provide feedback about the scores to participant    Expected Outcomes Short Term goal: Utilizing psychosocial counselor, staff and physician to assist with identification of specific Stressors or current issues interfering with healing process. Setting desired goal for each stressor or current issue identified.;Long Term Goal: Stressors  or current issues are controlled or eliminated.;Short Term goal: Identification and review with participant of any Quality of Life or Depression concerns found by scoring the questionnaire.;Long Term goal: The participant improves quality of Life and PHQ9 Scores  as seen by post scores and/or verbalization of changes             Quality of Life Scores:   Quality of Life - 12/25/21 1625       Quality of Life   Select Quality of Life      Quality of Life Scores   Health/Function Pre 16.37 %    Socioeconomic Pre 23.79 %    Psych/Spiritual Pre 24 %    Family Pre 27.6 %    GLOBAL Pre 21.12 %            Scores of 19 and below usually indicate a poorer quality of life in these areas.  A difference of  2-3 points is a clinically meaningful difference.  A difference of 2-3 points in the total score of the Quality of Life Index has been associated with significant improvement in overall quality of life, self-image, physical symptoms, and general health in studies assessing change in quality of life.  PHQ-9: Review Flowsheet       12/21/2021 02/07/2015  Depression screen PHQ 2/9  Decreased Interest 0 0  Down, Depressed, Hopeless 0 0  PHQ - 2 Score 0 0  Altered sleeping 0 -  Tired, decreased energy 1 -  Change in appetite 0 -  Feeling bad or failure about yourself  0 -  Trouble concentrating 0 -  Moving slowly or fidgety/restless 0 -  Suicidal thoughts 0 -  PHQ-9 Score 1 -  Difficult doing work/chores Somewhat difficult -   Interpretation of Total Score  Total Score Depression Severity:  1-4 = Minimal depression, 5-9 = Mild depression, 10-14 = Moderate depression, 15-19 = Moderately severe depression, 20-27 = Severe depression   Psychosocial Evaluation and Intervention:  Psychosocial Evaluation - 12/14/21 1350       Psychosocial Evaluation & Interventions   Interventions Encouraged to exercise with the program and follow exercise prescription    Comments Pamela has no barriers to attending the program. He lives with his wife of 14 years. She is his support. He wants to get back to normal. He does play golf. He is ready to start the program    Expected Outcomes STG Samuel [name] attends all scheduled sessions, he is able to progress his  exercise to the point he can get back to his golf game. LTG Samuel [name] continues to work on his exercise progression after discharge.    Continue Psychosocial Services  Follow up required by staff             Psychosocial Re-Evaluation:  Psychosocial Re-Evaluation     Samuel [name] Name 01/05/22 1121             Psychosocial Re-Evaluation   Current issues with None Identified       Comments Patient reports no issues with their current mental states, sleep, stress, depression or anxiety. Will follow up with patient in a few weeks for any changes.       Expected Outcomes Short: Continue to exercise regularly to support mental health and notify staff of any changes. Long: maintain mental health and well being through teaching of rehab or prescribed medications independently.       Interventions Encouraged to attend Cardiac Rehabilitation for the exercise  Continue Psychosocial Services  Follow up required by staff                Psychosocial Discharge (Final Psychosocial Re-Evaluation):  Psychosocial Re-Evaluation - 01/05/22 1121       Psychosocial Re-Evaluation   Current issues with None Identified    Comments Patient reports no issues with their current mental states, sleep, stress, depression or anxiety. Will follow up with patient in a few weeks for any changes.    Expected Outcomes Short: Continue to exercise regularly to support mental health and notify staff of any changes. Long: maintain mental health and well being through teaching of rehab or prescribed medications independently.    Interventions Encouraged to attend Cardiac Rehabilitation for the exercise    Continue Psychosocial Services  Follow up required by staff             Vocational Rehabilitation: Provide vocational rehab assistance to qualifying candidates.   Vocational Rehab Evaluation & Intervention:   Education: Education Goals: Education classes will be provided on a variety of topics geared toward  better understanding of heart health and risk factor modification. Participant will state understanding/return demonstration of topics presented as noted by education test scores.  Learning Barriers/Preferences:  Learning Barriers/Preferences - 12/14/21 1343       Learning Barriers/Preferences   Learning Barriers None    Learning Preferences None             General Cardiac Education Topics:  AED/CPR: - Group verbal and written instruction with the use of models to demonstrate the basic use of the AED with the basic ABC's of resuscitation.   Anatomy and Cardiac Procedures: - Group verbal and visual presentation and models provide information about basic cardiac anatomy and function. Reviews the testing methods done to diagnose heart disease and the outcomes of the test results. Describes the treatment choices: Medical Management, Angioplasty, or Coronary Bypass Surgery for treating various heart conditions including Myocardial Infarction, Angina, Valve Disease, and Cardiac Arrhythmias.  Written material given at graduation.   Medication Safety: - Group verbal and visual instruction to review commonly prescribed medications for heart and lung disease. Reviews the medication, class of the drug, and side effects. Includes the steps to properly store meds and maintain the prescription regimen.  Written material given at graduation.   Intimacy: - Group verbal instruction through game format to discuss how heart and lung disease can affect sexual intimacy. Written material given at graduation.. Flowsheet Row Cardiac Rehab from 01/17/2022 in [facility] Cardiac and Pulmonary Rehab  Date 01/03/22  Educator [facility]  Instruction Review Code 1- Verbalizes Understanding       Know Your Numbers and Heart Failure: - Group verbal and visual instruction to discuss disease risk factors for cardiac and pulmonary disease and treatment options.  Reviews associated critical values for Overweight/Obesity,  Hypertension, Cholesterol, and Diabetes.  Discusses basics of heart failure: signs/symptoms and treatments.  Introduces Heart Failure Zone chart for action plan for heart failure.  Written material given at graduation.   Infection Prevention: - Provides verbal and written material to individual with discussion of infection control including proper hand washing and proper equipment cleaning during exercise session.   Falls Prevention: - Provides verbal and written material to individual with discussion of falls prevention and safety. Flowsheet Row Cardiac Rehab from 01/17/2022 in [facility] Cardiac and Pulmonary Rehab  Date 12/14/21  Educator SB  Instruction Review Code 1- Verbalizes Understanding       Other: -Provides group and verbal  instruction on various topics (see comments)   Knowledge Questionnaire Score:  Knowledge Questionnaire Score - 12/25/21 1624       Knowledge Questionnaire Score   Pre Score 23/26             Core Components/Risk Factors/Patient Goals at Admission:  Personal Goals and Risk Factors at Admission - 12/21/21 1306       Core Components/Risk Factors/Patient Goals on Admission    Weight Management Yes    Intervention Weight Management: Develop a combined nutrition and exercise program designed to reach desired caloric intake, while maintaining appropriate intake of nutrient and fiber, sodium and fats, and appropriate energy expenditure required for the weight goal.;Weight Management: Provide education and appropriate resources to help participant work on and attain dietary goals.;Weight Management/Obesity: Establish reasonable short term and long term weight goals.    Admit Weight 203 lb 1.6 oz (92.1 kg)    Goal Weight: Short Term 196 lb (88.9 kg)    Goal Weight: Long Term 180 lb (81.6 kg)    Expected Outcomes Short Term: Continue to assess and modify interventions until short term weight is achieved;Long Term: Adherence to nutrition and physical  activity/exercise program aimed toward attainment of established weight goal;Weight Loss: Understanding of general recommendations for a balanced deficit meal plan, which promotes 1-2 lb weight loss per week and includes a negative energy balance of [phone] kcal/d    Diabetes Yes    Intervention Provide education about signs/symptoms and action to take for hypo/hyperglycemia.;Provide education about proper nutrition, including hydration, and aerobic/resistive exercise prescription along with prescribed medications to achieve blood glucose in normal ranges: Fasting glucose 65-99 mg/dL    Expected Outcomes Short Term: Participant verbalizes understanding of the signs/symptoms and immediate care of hyper/hypoglycemia, proper foot care and importance of medication, aerobic/resistive exercise and nutrition plan for blood glucose control.;Long Term: Attainment of HbA1C < 7%.    Hypertension Yes    Intervention Provide education on lifestyle modifcations including regular physical activity/exercise, weight management, moderate sodium restriction and increased consumption of fresh fruit, vegetables, and low fat dairy, alcohol moderation, and smoking cessation.;Monitor prescription use compliance.    Expected Outcomes Short Term: Continued assessment and intervention until BP is < 140/75m HG in hypertensive participants. < 130/865mHG in hypertensive participants with diabetes, heart failure or chronic kidney disease.;Long Term: Maintenance of blood pressure at goal levels.    Lipids Yes    Intervention Provide education and support for participant on nutrition & aerobic/resistive exercise along with prescribed medications to achieve LDL <7026mHDL >85m57m  Expected Outcomes Short Term: Participant states understanding of desired cholesterol values and is compliant with medications prescribed. Participant is following exercise prescription and nutrition guidelines.;Long Term: Cholesterol controlled with  medications as prescribed, with individualized exercise RX and with personalized nutrition plan. Value goals: LDL < 70mg64mL > 40 mg.             Education:Diabetes - Individual verbal and written instruction to review signs/symptoms of diabetes, desired ranges of glucose level fasting, after meals and with exercise. Acknowledge that pre and post exercise glucose checks will be done for 3 sessions at entry of program.   Core Components/Risk Factors/Patient Goals Review:   Goals and Risk Factor Review     Row Name 01/05/22 1116             Core Components/Risk Factors/Patient Goals Review   Personal Goals Review Weight Management/Obesity       Review Samuel [name]  doing well since the start of the program. He wants to be able to play golf again without getting short of breath. He is trying to lose some weight. He has lose two pounds since the start of the program snd would like to reach a weight goal of 180 pounds. He is going to try to reach obtainable goals and lose weight little by little.       Expected Outcomes Short: lose 5 pounds in the next few weeks. Long: maintain reach weight goal.                Core Components/Risk Factors/Patient Goals at Discharge (Final Review):   Goals and Risk Factor Review - 01/05/22 1116       Core Components/Risk Factors/Patient Goals Review   Personal Goals Review Weight Management/Obesity    Review Samuel [name] has been doing well since the start of the program. He wants to be able to play golf again without getting short of breath. He is trying to lose some weight. He has lose two pounds since the start of the program snd would like to reach a weight goal of 180 pounds. He is going to try to reach obtainable goals and lose weight little by little.    Expected Outcomes Short: lose 5 pounds in the next few weeks. Long: maintain reach weight goal.             ITP Comments:  ITP Comments     Row Name 12/14/21 1354 12/21/21 1246 12/25/21  1125 12/27/21 1041 01/24/22 1107   ITP Comments Virtual orientation call completed today. he has an appointment on Date: 12/21/2021  for EP eval and gym Orientation.  Documentation of diagnosis can be found in [facility] [place] Date: 11/22/2021 . Completed 6MWT and gym orientation. Initial ITP created and sent for review to Dr Ramonita Lab, Medical Director. First full day of exercise!  Patient was oriented to gym and equipment including functions, settings, policies, and procedures.  Patient's individual exercise prescription and treatment plan were reviewed.  All starting workloads were established based on the results of the 6 minute walk test done at initial orientation visit.  The plan for exercise progression was also introduced and progression will be customized based on patient's performance and goals. 30 Day review completed. Medical Director ITP review done, changes made as directed, and signed approval by Medical Director.    NEW TO PROGRAM 30 Day review completed. Medical Director ITP review done, changes made as directed, and signed approval by Medical Director.            Comments:

## 2022-01-26 ENCOUNTER — Encounter: Payer: Medicare Other | Attending: Cardiology | Admitting: *Deleted

## 2022-01-26 DIAGNOSIS — Z951 Presence of aortocoronary bypass graft: Secondary | ICD-10-CM | POA: Insufficient documentation

## 2022-01-26 DIAGNOSIS — I214 Non-ST elevation (NSTEMI) myocardial infarction: Secondary | ICD-10-CM | POA: Diagnosis present

## 2022-01-26 DIAGNOSIS — Z48812 Encounter for surgical aftercare following surgery on the circulatory system: Secondary | ICD-10-CM | POA: Insufficient documentation

## 2022-01-26 NOTE — Progress Notes (Signed)
Daily Session Note  Patient Details  Name: Samuel [name] MRN: [id number] Date of Birth: [birth year] Referring Provider:   Flowsheet Row Cardiac Rehab from 12/21/2021 in [facility] Cardiac and Pulmonary Rehab  Referring Provider Isaias Cowman, MD       Encounter Date: 01/26/2022  Check In:  Session Check In - 01/26/22 1139       Check-In   Supervising physician immediately available to respond to emergencies See telemetry face sheet for immediately available ER MD    Location [facility]-Cardiac & Pulmonary Rehab    Staff Present Justin Mend, RCP,RRT,BSRT;Tassie Pollett [place], MA, RCEP, CCRP, CCET;Marvell Fuller, PhD, RN, CNS, CEN    Virtual Visit No    Medication changes reported     No    Fall or balance concerns reported    No    Warm-up and Cool-down Performed on first and last piece of equipment    Resistance Training Performed Yes    VAD Patient? No    PAD/SET Patient? No      Pain Assessment   Currently in Pain? No/denies                Social History   Tobacco Use  Smoking Status Never  Smokeless Tobacco Never    Goals Met:  Independence with exercise equipment Exercise tolerated well No report of concerns or symptoms today Strength training completed today  Goals Unmet:  Not Applicable  Comments: Pt able to follow exercise prescription today without complaint.  Will continue to monitor for progression.    Dr. Emily Filbert is Medical Director for [place].  Dr. Ottie Glazier is Medical Director for [facility] Pulmonary Rehabilitation.

## 2022-01-29 ENCOUNTER — Encounter: Payer: Medicare Other | Admitting: *Deleted

## 2022-01-29 DIAGNOSIS — I214 Non-ST elevation (NSTEMI) myocardial infarction: Secondary | ICD-10-CM

## 2022-01-29 DIAGNOSIS — Z951 Presence of aortocoronary bypass graft: Secondary | ICD-10-CM

## 2022-01-29 NOTE — Progress Notes (Signed)
Daily Session Note  Patient Details  Name: Samuel [name] MRN: [id number] Date of Birth: April 27, 1950 Referring Provider:   Flowsheet Row Cardiac Rehab from 12/21/2021 in [facility] Cardiac and Pulmonary Rehab  Referring Provider Isaias Cowman, MD       Encounter Date: 01/29/2022  Check In:  Session Check In - 01/29/22 1117       Check-In   Supervising physician immediately available to respond to emergencies See telemetry face sheet for immediately available ER MD    Location [facility]-Cardiac & Pulmonary Rehab    Staff Present Darlyne Russian, RN, Doyce Para, BS, ACSM CEP, Exercise Physiologist;Meredith Sherryll Burger, RN BSN;Noah Tickle, BS, Exercise Physiologist    Virtual Visit No    Medication changes reported     No    Fall or balance concerns reported    No    Warm-up and Cool-down Performed on first and last piece of equipment    Resistance Training Performed Yes    VAD Patient? No    PAD/SET Patient? No      Pain Assessment   Currently in Pain? No/denies                Social History   Tobacco Use  Smoking Status Never  Smokeless Tobacco Never    Goals Met:  Independence with exercise equipment Exercise tolerated well No report of concerns or symptoms today Strength training completed today  Goals Unmet:  Not Applicable  Comments: Pt able to follow exercise prescription today without complaint.  Will continue to monitor for progression.    Dr. Emily Filbert is Medical Director for [place].  Dr. Ottie Glazier is Medical Director for [facility] Pulmonary Rehabilitation.

## 2022-01-31 ENCOUNTER — Encounter: Payer: Medicare Other | Admitting: *Deleted

## 2022-01-31 DIAGNOSIS — I214 Non-ST elevation (NSTEMI) myocardial infarction: Secondary | ICD-10-CM

## 2022-01-31 DIAGNOSIS — Z951 Presence of aortocoronary bypass graft: Secondary | ICD-10-CM

## 2022-01-31 NOTE — Progress Notes (Signed)
Daily Session Note  Patient Details  Name: Samuel [name] MRN: [id number] Date of Birth: October 18, 1950 Referring Provider:   Flowsheet Row Cardiac Rehab from 12/21/2021 in [facility] Cardiac and Pulmonary Rehab  Referring Provider Isaias Cowman, MD       Encounter Date: 01/31/2022  Check In:  Session Check In - 01/31/22 1245       Check-In   Supervising physician immediately available to respond to emergencies See telemetry face sheet for immediately available ER MD    Staff Present Nyoka Cowden, RN, BSN, Lauretta Grill, RCP,RRT,BSRT;Laureen Owens Shark, BS, RRT, CPFT;Kelly Amedeo Plenty, BS, ACSM CEP, Exercise Physiologist    Virtual Visit No    Medication changes reported     No    Fall or balance concerns reported    No    Tobacco Cessation No Change    Warm-up and Cool-down Performed on first and last piece of equipment    Resistance Training Performed Yes    VAD Patient? No    PAD/SET Patient? No      Pain Assessment   Currently in Pain? No/denies                Social History   Tobacco Use  Smoking Status Never  Smokeless Tobacco Never    Goals Met:  Independence with exercise equipment Exercise tolerated well No report of concerns or symptoms today  Goals Unmet:  Not Applicable  Comments: Pt able to follow exercise prescription today without complaint.  Will continue to monitor for progression.    Dr. Emily Filbert is Medical Director for [place].  Dr. Ottie Glazier is Medical Director for [facility] Pulmonary Rehabilitation.

## 2022-02-02 ENCOUNTER — Encounter: Payer: Medicare Other | Admitting: *Deleted

## 2022-02-02 DIAGNOSIS — I214 Non-ST elevation (NSTEMI) myocardial infarction: Secondary | ICD-10-CM | POA: Diagnosis not present

## 2022-02-02 NOTE — Progress Notes (Signed)
Daily Session Note  Patient Details  Name: Samuel [name] MRN: [id number] Date of Birth: September 04, 1950 Referring Provider:   Flowsheet Row Cardiac Rehab from 12/21/2021 in [facility] Cardiac and Pulmonary Rehab  Referring Provider Isaias Cowman, MD       Encounter Date: 02/02/2022  Check In:  Session Check In - 02/02/22 1138       Check-In   Supervising physician immediately available to respond to emergencies See telemetry face sheet for immediately available ER MD    Location [facility]-Cardiac & Pulmonary Rehab    Staff Present Heath Lark, RN, BSN, CCRP;Jessica [place], MA, RCEP, CCRP, CCET;Joseph [place], [place]    Virtual Visit No    Medication changes reported     No    Fall or balance concerns reported    No    Warm-up and Cool-down Performed on first and last piece of equipment    Resistance Training Performed Yes    VAD Patient? No    PAD/SET Patient? No      Pain Assessment   Currently in Pain? No/denies                Social History   Tobacco Use  Smoking Status Never  Smokeless Tobacco Never    Goals Met:  Independence with exercise equipment Exercise tolerated well No report of concerns or symptoms today  Goals Unmet:  Not Applicable  Comments: Pt able to follow exercise prescription today without complaint.  Will continue to monitor for progression.    Dr. Emily Filbert is Medical Director for [place].  Dr. Ottie Glazier is Medical Director for [facility] Pulmonary Rehabilitation.

## 2022-02-05 ENCOUNTER — Encounter: Payer: Medicare Other | Admitting: *Deleted

## 2022-02-05 DIAGNOSIS — Z951 Presence of aortocoronary bypass graft: Secondary | ICD-10-CM

## 2022-02-05 DIAGNOSIS — I214 Non-ST elevation (NSTEMI) myocardial infarction: Secondary | ICD-10-CM | POA: Diagnosis not present

## 2022-02-05 NOTE — Progress Notes (Signed)
Daily Session Note  Patient Details  Name: REGGINALD PASK MRN: [id number] Date of Birth: [birth year] Referring Provider:   Flowsheet Row Cardiac Rehab from 12/21/2021 in [facility] Cardiac and Pulmonary Rehab  Referring Provider Isaias Cowman, MD       Encounter Date: 02/05/2022  Check In:  Session Check In - 02/05/22 1107       Check-In   Supervising physician immediately available to respond to emergencies See telemetry face sheet for immediately available ER MD    Location [facility]-Cardiac & Pulmonary Rehab    Staff Present Darlyne Russian, RN, Doyce Para, BS, ACSM CEP, Exercise Physiologist;Noah Tickle, BS, Exercise Physiologist    Virtual Visit No    Medication changes reported     No    Fall or balance concerns reported    No    Warm-up and Cool-down Performed on first and last piece of equipment    Resistance Training Performed Yes    VAD Patient? No    PAD/SET Patient? No      Pain Assessment   Currently in Pain? No/denies                Social History   Tobacco Use  Smoking Status Never  Smokeless Tobacco Never    Goals Met:  Independence with exercise equipment Exercise tolerated well No report of concerns or symptoms today Strength training completed today  Goals Unmet:  Not Applicable  Comments: Pt able to follow exercise prescription today without complaint.  Will continue to monitor for progression.    Dr. Emily Filbert is Medical Director for [place].  Dr. Ottie Glazier is Medical Director for [facility] Pulmonary Rehabilitation.

## 2022-02-07 ENCOUNTER — Encounter: Payer: Medicare Other | Admitting: *Deleted

## 2022-02-09 ENCOUNTER — Encounter: Payer: Medicare Other | Admitting: *Deleted

## 2022-02-09 DIAGNOSIS — I214 Non-ST elevation (NSTEMI) myocardial infarction: Secondary | ICD-10-CM

## 2022-02-09 DIAGNOSIS — Z951 Presence of aortocoronary bypass graft: Secondary | ICD-10-CM

## 2022-02-09 NOTE — Progress Notes (Signed)
Daily Session Note  Patient Details  Name: Samuel [name] MRN: [id number] Date of Birth: [birth year] Referring Provider:   Flowsheet Row Cardiac Rehab from 12/21/2021 in [facility] Cardiac and Pulmonary Rehab  Referring Provider Isaias Cowman, MD       Encounter Date: 02/09/2022  Check In:  Session Check In - 02/09/22 1132       Check-In   Supervising physician immediately available to respond to emergencies See telemetry face sheet for immediately available ER MD    Location [facility]-Cardiac & Pulmonary Rehab    Staff Present Heath Lark, RN, BSN, CCRP;Jessica [place], MA, RCEP, CCRP, CCET;Joseph [place], [place]    Virtual Visit No    Medication changes reported     No    Fall or balance concerns reported    No    Warm-up and Cool-down Performed on first and last piece of equipment    Resistance Training Performed Yes    VAD Patient? No    PAD/SET Patient? No      Pain Assessment   Currently in Pain? No/denies                Social History   Tobacco Use  Smoking Status Never  Smokeless Tobacco Never    Goals Met:  Independence with exercise equipment Exercise tolerated well No report of concerns or symptoms today  Goals Unmet:  Not Applicable  Comments: Pt able to follow exercise prescription today without complaint.  Will continue to monitor for progression.    Dr. Emily Filbert is Medical Director for [place].  Dr. Ottie Glazier is Medical Director for [facility] Pulmonary Rehabilitation.

## 2022-02-12 ENCOUNTER — Encounter: Payer: Medicare Other | Admitting: *Deleted

## 2022-02-12 DIAGNOSIS — I214 Non-ST elevation (NSTEMI) myocardial infarction: Secondary | ICD-10-CM

## 2022-02-12 DIAGNOSIS — Z951 Presence of aortocoronary bypass graft: Secondary | ICD-10-CM

## 2022-02-12 NOTE — Progress Notes (Signed)
Daily Session Note  Patient Details  Name: Samuel [name] MRN: [id number] Date of Birth: [birth year] Referring Provider:   Flowsheet Row Cardiac Rehab from 12/21/2021 in [facility] Cardiac and Pulmonary Rehab  Referring Provider Isaias Cowman, MD       Encounter Date: 02/12/2022  Check In:  Session Check In - 02/12/22 1108       Check-In   Supervising physician immediately available to respond to emergencies See telemetry face sheet for immediately available ER MD    Location [facility]-Cardiac & Pulmonary Rehab    Staff Present Darlyne Russian, RN, ADN;Meredith Sherryll Burger, RN BSN;Noah Tickle, BS, Exercise Physiologist    Virtual Visit No    Medication changes reported     No    Fall or balance concerns reported    No    Warm-up and Cool-down Performed on first and last piece of equipment    Resistance Training Performed Yes    VAD Patient? No    PAD/SET Patient? No      Pain Assessment   Currently in Pain? No/denies                Social History   Tobacco Use  Smoking Status Never  Smokeless Tobacco Never    Goals Met:  Independence with exercise equipment Exercise tolerated well No report of concerns or symptoms today Strength training completed today  Goals Unmet:  Not Applicable  Comments: Pt able to follow exercise prescription today without complaint.  Will continue to monitor for progression.    Dr. Emily Filbert is Medical Director for [facility].  Dr. Ottie Glazier is Medical Director for [facility] Pulmonary Rehabilitation.

## 2022-02-16 ENCOUNTER — Encounter: Payer: Medicare Other | Admitting: *Deleted

## 2022-02-16 DIAGNOSIS — I214 Non-ST elevation (NSTEMI) myocardial infarction: Secondary | ICD-10-CM

## 2022-02-16 DIAGNOSIS — Z951 Presence of aortocoronary bypass graft: Secondary | ICD-10-CM

## 2022-02-16 NOTE — Progress Notes (Signed)
Daily Session Note  Patient Details  Name: Samuel [name] MRN: [id number] Date of Birth: Jun 30, 1950 Referring Provider:   Flowsheet Row Cardiac Rehab from 12/21/2021 in [facility] Cardiac and Pulmonary Rehab  Referring Provider Isaias Cowman, MD       Encounter Date: 02/16/2022  Check In:  Session Check In - 02/16/22 1121       Check-In   Supervising physician immediately available to respond to emergencies See telemetry face sheet for immediately available ER MD    Location [facility]-Cardiac & Pulmonary Rehab    Staff Present Heath Lark, RN, BSN, CCRP;Jessica [place], MA, RCEP, CCRP, CCET;[employer], [place]    Virtual Visit No    Medication changes reported     No    Fall or balance concerns reported    No    Warm-up and Cool-down Performed on first and last piece of equipment    Resistance Training Performed Yes    VAD Patient? No    PAD/SET Patient? No      Pain Assessment   Currently in Pain? No/denies                Social History   Tobacco Use  Smoking Status Never  Smokeless Tobacco Never    Goals Met:  Independence with exercise equipment Exercise tolerated well No report of concerns or symptoms today  Goals Unmet:  Not Applicable  Comments: Pt able to follow exercise prescription today without complaint.  Will continue to monitor for progression.    Dr. Emily Filbert is Medical Director for [facility].  Dr. Ottie Glazier is Medical Director for [facility] Pulmonary Rehabilitation.

## 2022-02-21 ENCOUNTER — Encounter: Payer: Medicare Other | Admitting: *Deleted

## 2022-02-21 ENCOUNTER — Encounter: Payer: Self-pay | Admitting: *Deleted

## 2022-02-21 DIAGNOSIS — I214 Non-ST elevation (NSTEMI) myocardial infarction: Secondary | ICD-10-CM | POA: Diagnosis not present

## 2022-02-21 DIAGNOSIS — Z951 Presence of aortocoronary bypass graft: Secondary | ICD-10-CM

## 2022-02-21 NOTE — Progress Notes (Signed)
Daily Session Note  Patient Details  Name: Samuel [name] MRN: [id number] Date of Birth: Jun 27, 1950 Referring Provider:   Flowsheet Row Cardiac Rehab from 12/21/2021 in [facility] Cardiac and Pulmonary Rehab  Referring Provider Isaias Cowman, MD       Encounter Date: 02/21/2022  Check In:  Session Check In - 02/21/22 1036       Check-In   Supervising physician immediately available to respond to emergencies See telemetry face sheet for immediately available ER MD    Location [facility]-Cardiac & Pulmonary Rehab    Staff Present Darlyne Russian, RN, ADN;Jessica Luan Pulling, MA, RCEP, CCRP, CCET;Noah Tickle, BS, Exercise Physiologist    Virtual Visit No    Medication changes reported     No    Fall or balance concerns reported    No    Warm-up and Cool-down Performed on first and last piece of equipment    Resistance Training Performed Yes    VAD Patient? No    PAD/SET Patient? No      Pain Assessment   Currently in Pain? No/denies                Social History   Tobacco Use  Smoking Status Never  Smokeless Tobacco Never    Goals Met:  Independence with exercise equipment Exercise tolerated well No report of concerns or symptoms today Strength training completed today  Goals Unmet:  Not Applicable  Comments: Pt able to follow exercise prescription today without complaint.  Will continue to monitor for progression.    Dr. Emily Filbert is Medical Director for [place].  Dr. Ottie Glazier is Medical Director for [facility] Pulmonary Rehabilitation.

## 2022-02-21 NOTE — Progress Notes (Signed)
Cardiac Individual Treatment Plan  Patient Details  Name: JAKALEB PAYER MRN: [id number] Date of Birth: 09/28/1950 Referring Provider:   Flowsheet Row Cardiac Rehab from 12/21/2021 in [facility] Cardiac and Pulmonary Rehab  Referring Provider Isaias Cowman, MD       Initial Encounter Date:  Flowsheet Row Cardiac Rehab from 12/21/2021 in [facility] Cardiac and Pulmonary Rehab  Date 12/21/21       Visit Diagnosis: NSTEMI (non-ST elevation myocardial infarction) ([place])  S/P CABG x 3  Patient's Home Medications on Admission:  Current Outpatient Medications:    acetaminophen (TYLENOL) 500 MG tablet, Take 1-2 tablets (500-1,000 mg total) by mouth every 6 (six) hours as needed., Disp: 30 tablet, Rfl: 0   amLODipine (NORVASC) 5 MG tablet, Take 5 mg by mouth daily., Disp: , Rfl:    aspirin 81 MG EC tablet, Take 81 mg by mouth daily. , Disp: , Rfl:    atorvastatin (LIPITOR) 80 MG tablet, Take 1 tablet (80 mg total) by mouth daily., Disp: 30 tablet, Rfl: 0   clopidogrel (PLAVIX) 75 MG tablet, Take 1 tablet (75 mg total) by mouth daily., Disp: 30 tablet, Rfl: 11   lisinopril-hydrochlorothiazide (ZESTORETIC) 20-25 MG tablet, Take 1 tablet by mouth daily., Disp: , Rfl:    loratadine (CLARITIN) 10 MG tablet, Take 10 mg by mouth daily., Disp: , Rfl:    metFORMIN (GLUCOPHAGE) 500 MG tablet, Take 1 tablet (500 mg total) by mouth 2 (two) times daily with a meal., Disp: , Rfl:    metoprolol tartrate (LOPRESSOR) 25 MG tablet, Take 0.5 tablets (12.5 mg total) by mouth 2 (two) times daily., Disp: 60 tablet, Rfl: 3   nitroGLYCERIN (NITROSTAT) 0.4 MG SL tablet, Place under the tongue., Disp: , Rfl:    omeprazole (PRILOSEC) 20 MG capsule, Take 20 mg by mouth daily before breakfast., Disp: , Rfl:    ONETOUCH ULTRA test strip, CHECK BLOOD SUGAR TWICE DAILY., Disp: , Rfl:    oxybutynin (DITROPAN-XL) 5 MG 24 hr tablet, Take 5 mg by mouth daily., Disp: , Rfl:    pravastatin (PRAVACHOL) 40 MG tablet, Take 40 mg by  mouth daily., Disp: , Rfl:   Past Medical History: Past Medical History:  Diagnosis Date   Anxiety    situational   Arthritis    Cancer ([place])    squamous cell carcinoma   ED (erectile dysfunction)    GERD (gastroesophageal reflux disease)    Hyperlipidemia    Hypertension    OSA (obstructive sleep apnea)    CPAP   Stroke ([place])    no residual    Uncontrolled type 2 diabetes mellitus with hyperglycemia, with long-term current use of insulin (HCC)     Tobacco Use: Social History   Tobacco Use  Smoking Status Never  Smokeless Tobacco Never    Labs: Review Flowsheet  More data may exist      Latest Ref Rng & Units 03/18/2019 11/03/2019 11/21/2021 11/23/2021 11/24/2021  Labs for ITP Cardiac and Pulmonary Rehab  Cholestrol 0 - 200 mg/dL - - 174  - -  LDL (calc) 0 - 99 mg/dL - - 108  - -  HDL-C >40 mg/dL - - 27  - -  Trlycerides <150 mg/dL - - 197  - -  Hemoglobin A1c 4.8 - 5.6 % 6.9  6.6  7.3  - -  PH, Arterial 7.35 - 7.45 - - - 7.46  7.340  7.375  7.306  7.276  7.317  7.383  7.334  7.324  PCO2 arterial 32 - 48 mmHg - - - 32  43.0  35.1  43.0  48.3  47.5  39.7  49.0  41.7   Bicarbonate 20.0 - 28.0 mmol/L - - - 22.8  23.3  20.7  21.7  22.7  24.3  23.6  24.9  26.1  21.7   TCO2 22 - 32 mmol/L - - - - _0 Acid-base deficit 0.0 - 2.0 mmol/L - - - 0.3  3.0  4.0  5.0  4.0  2.0  1.0  1.0  4.0   O2 Saturation % - - - 98.1  87  98  99  98  100  100  79  100  99      Exercise Target Goals: Exercise Program Goal: Individual exercise prescription set using results from initial 6 min walk test and THRR while considering  patient's activity barriers and safety.   Exercise Prescription Goal: Initial exercise prescription builds to 30-45 minutes a day of aerobic activity, 2-3 days per week.  Home exercise guidelines will be given to patient during program as part of exercise prescription that the participant will  acknowledge.   Education: Aerobic Exercise: - Group verbal and visual presentation on the components of exercise prescription. Introduces F.I.T.T principle from ACSM for exercise prescriptions.  Reviews F.I.T.T. principles of aerobic exercise including progression. Written material given at graduation. Flowsheet Row Cardiac Rehab from 02/21/2022 in [facility] Cardiac and Pulmonary Rehab  Date 01/03/22  Educator [facility]  Instruction Review Code 1- Verbalizes Understanding       Education: Resistance Exercise: - Group verbal and visual presentation on the components of exercise prescription. Introduces F.I.T.T principle from ACSM for exercise prescriptions  Reviews F.I.T.T. principles of resistance exercise including progression. Written material given at graduation. Flowsheet Row Cardiac Rehab from 02/21/2022 in [facility] Cardiac and Pulmonary Rehab  Date 01/17/22  Educator NT  Instruction Review Code 1- Verbalizes Understanding        Education: Exercise & Equipment Safety: - Individual verbal instruction and demonstration of equipment use and safety with use of the equipment.   Education: Exercise Physiology & General Exercise Guidelines: - Group verbal and written instruction with models to review the exercise physiology of the cardiovascular system and associated critical values. Provides general exercise guidelines with specific guidelines to those with heart or lung disease.  Flowsheet Row Cardiac Rehab from 02/21/2022 in [facility] Cardiac and Pulmonary Rehab  Date 12/27/21  Educator [facility]  Instruction Review Code 1- Verbalizes Understanding       Education: Flexibility, Balance, Mind/Body Relaxation: - Group verbal and visual presentation with interactive activity on the components of exercise prescription. Introduces F.I.T.T principle from ACSM for exercise prescriptions. Reviews F.I.T.T. principles of flexibility and balance exercise training including progression. Also discusses the mind body  connection.  Reviews various relaxation techniques to help reduce and manage stress (i.e. Deep breathing, progressive muscle relaxation, and visualization). Balance handout provided to take home. Written material given at graduation. Flowsheet Row Cardiac Rehab from 02/21/2022 in [facility] Cardiac and Pulmonary Rehab  Date 01/17/22  Educator NT  Instruction Review Code 1- Verbalizes Understanding       Activity Barriers & Risk Stratification:  Activity Barriers & Cardiac Risk Stratification - 12/21/21 1253       Activity Barriers & Cardiac Risk Stratification   Activity Barriers Left Knee  Replacement;Right Knee Replacement;Arthritis   arthritis in both arms limits ROM   Cardiac Risk Stratification High             6 Minute Walk:  6 Minute Walk     Row Name 12/21/21 1252         6 Minute Walk   Phase Initial     Distance 1235 feet     Walk Time 6 minutes     # of Rest Breaks 0     MPH 2.34     METS 2.49     RPE 11     Perceived Dyspnea  2     VO2 Peak 8.71     Symptoms Yes (comment)     Comments SOB     Resting HR 64 bpm     Resting BP 114/66     Resting Oxygen Saturation  98 %     Exercise Oxygen Saturation  during 6 min walk 97 %     Max Ex. HR 104 bpm     Max Ex. BP 130/80     2 Minute Post BP 122/76              Oxygen Initial Assessment:   Oxygen Re-Evaluation:   Oxygen Discharge (Final Oxygen Re-Evaluation):   Initial Exercise Prescription:  Initial Exercise Prescription - 12/21/21 1300       Date of Initial Exercise RX and Referring Provider   Date 12/21/21    Referring Provider Isaias Cowman, MD      Oxygen   Maintain Oxygen Saturation 88% or higher      Treadmill   MPH 2    Grade 0.5    Minutes 15    METs 2.67      NuStep   Level 2    SPM 80    Minutes 15    METs 2.49      REL-XR   Level 1    Speed 50    Minutes 15    METs 2.49      Prescription Details   Frequency (times per week) 3    Duration Progress to 30  minutes of continuous aerobic without signs/symptoms of physical distress      Intensity   THRR 40-80% of Max Heartrate 98-131    Ratings of Perceived Exertion 11-13    Perceived Dyspnea 0-4      Progression   Progression Continue to progress workloads to maintain intensity without signs/symptoms of physical distress.      Resistance Training   Training Prescription Yes    Weight 3 lb    Reps 10-15             Perform Capillary Blood Glucose checks as needed.  Exercise Prescription Changes:   Exercise Prescription Changes     Row Name 12/21/21 1300 01/04/22 1400 01/05/22 1100 01/17/22 1500 02/01/22 1600     Response to Exercise   Blood Pressure (Admit) 114/66 112/66 -- 124/60 102/60   Blood Pressure (Exercise) 130/80 142/70 -- 150/64 --   Blood Pressure (Exit) 122/76 102/60 -- 126/64 102/64   Heart Rate (Admit) 64 bpm 91 bpm -- 90 bpm 74 bpm   Heart Rate (Exercise) 104 bpm 127 bpm -- 137 bpm 130 bpm   Heart Rate (Exit) 67 bpm 103 bpm -- 88 bpm 87 bpm   Oxygen Saturation (Admit) 98 % -- -- -- --   Oxygen Saturation (Exercise) 97 % -- -- -- --   Rating of  Perceived Exertion (Exercise) 11 13 -- 15 15   Perceived Dyspnea (Exercise) 2 -- -- -- --   Symptoms SOB none -- none none   Comments 6MWT Results First 3 day of Rehab -- -- --   Duration -- Progress to 30 minutes of  aerobic without signs/symptoms of physical distress -- Continue with 30 min of aerobic exercise without signs/symptoms of physical distress. Continue with 30 min of aerobic exercise without signs/symptoms of physical distress.   Intensity -- THRR unchanged -- THRR unchanged THRR unchanged     Progression   Progression -- Continue to progress workloads to maintain intensity without signs/symptoms of physical distress. -- Continue to progress workloads to maintain intensity without signs/symptoms of physical distress. Continue to progress workloads to maintain intensity without signs/symptoms of physical  distress.   Average METs -- 2.71 -- 3.46 3.59     Resistance Training   Training Prescription -- Yes -- Yes Yes   Weight -- 3 lb -- 3 lb 3 lb   Reps -- 10-15 -- 10-15 10-15     Interval Training   Interval Training -- No -- No No     Treadmill   MPH -- 2.5 -- 2.8 2.8   Grade -- 1 -- 3.5 3   Minutes -- 15 -- 15 15   METs -- 3.26 -- 4.49 4.3     NuStep   Level -- 2 -- 5 5   Minutes -- 15 -- 15 15   METs -- 2.3 -- 2.7 2.4     REL-XR   Level -- 5 -- 6 7   Minutes -- 15 -- 15 15   METs -- 3.1 -- 0 --     Home Exercise Plan   Plans to continue exercise at -- -- Home (comment)  walking Home (comment)  walking Home (comment)  walking   Frequency -- -- Add 2 additional days to program exercise sessions. Add 2 additional days to program exercise sessions. Add 2 additional days to program exercise sessions.   Initial Home Exercises Provided -- -- 01/05/22 01/05/22 01/05/22     Oxygen   Maintain Oxygen Saturation -- 88% or higher -- 88% or higher 88% or higher    Row Name 02/14/22 1100             Response to Exercise   Blood Pressure (Admit) 128/74       Blood Pressure (Exit) 118/62       Heart Rate (Admit) 78 bpm       Heart Rate (Exercise) 115 bpm       Heart Rate (Exit) 87 bpm       Rating of Perceived Exertion (Exercise) 15       Symptoms none       Duration Continue with 30 min of aerobic exercise without signs/symptoms of physical distress.       Intensity THRR unchanged         Progression   Progression Continue to progress workloads to maintain intensity without signs/symptoms of physical distress.       Average METs 3.75         Resistance Training   Training Prescription Yes       Weight 5 lb       Reps 10-15         Interval Training   Interval Training No         Treadmill   MPH 2.8       Grade 2  Minutes 15       METs 3.92         REL-XR   Level 7       Minutes 15         Home Exercise Plan   Plans to continue exercise at Home  (comment)  walking       Frequency Add 2 additional days to program exercise sessions.       Initial Home Exercises Provided 01/05/22         Oxygen   Maintain Oxygen Saturation 88% or higher                Exercise Comments:   Exercise Comments     Row Name 12/25/21 1125           Exercise Comments First full day of exercise!  Patient was oriented to gym and equipment including functions, settings, policies, and procedures.  Patient's individual exercise prescription and treatment plan were reviewed.  All starting workloads were established based on the results of the 6 minute walk test done at initial orientation visit.  The plan for exercise progression was also introduced and progression will be customized based on patient's performance and goals.                Exercise Goals and Review:   Exercise Goals     Row Name 12/21/21 1305             Exercise Goals   Increase Physical Activity Yes       Intervention Provide advice, education, support and counseling about physical activity/exercise needs.;Develop an individualized exercise prescription for aerobic and resistive training based on initial evaluation findings, risk stratification, comorbidities and participant's personal goals.       Expected Outcomes Short Term: Attend rehab on a regular basis to increase amount of physical activity.;Long Term: Exercising regularly at least 3-5 days a week.;Long Term: Add in home exercise to make exercise part of routine and to increase amount of physical activity.       Increase Strength and Stamina Yes       Intervention Provide advice, education, support and counseling about physical activity/exercise needs.;Develop an individualized exercise prescription for aerobic and resistive training based on initial evaluation findings, risk stratification, comorbidities and participant's personal goals.       Expected Outcomes Short Term: Increase workloads from initial exercise  prescription for resistance, speed, and METs.;Short Term: Perform resistance training exercises routinely during rehab and add in resistance training at home;Long Term: Improve cardiorespiratory fitness, muscular endurance and strength as measured by increased METs and functional capacity (6MWT)       Able to understand and use rate of perceived exertion (RPE) scale Yes       Intervention Provide education and explanation on how to use RPE scale       Expected Outcomes Short Term: Able to use RPE daily in rehab to express subjective intensity level;Long Term:  Able to use RPE to guide intensity level when exercising independently       Able to understand and use Dyspnea scale Yes       Intervention Provide education and explanation on how to use Dyspnea scale       Expected Outcomes Long Term: Able to use Dyspnea scale to guide intensity level when exercising independently;Short Term: Able to use Dyspnea scale daily in rehab to express subjective sense of shortness of breath during exertion       Knowledge and understanding  of Target Heart Rate Range (THRR) Yes       Intervention Provide education and explanation of THRR including how the numbers were predicted and where they are located for reference       Expected Outcomes Short Term: Able to state/look up THRR;Long Term: Able to use THRR to govern intensity when exercising independently;Short Term: Able to use daily as guideline for intensity in rehab       Able to check pulse independently Yes       Intervention Provide education and demonstration on how to check pulse in carotid and radial arteries.;Review the importance of being able to check your own pulse for safety during independent exercise       Expected Outcomes Short Term: Able to explain why pulse checking is important during independent exercise;Long Term: Able to check pulse independently and accurately       Understanding of Exercise Prescription Yes       Intervention Provide  education, explanation, and written materials on patient's individual exercise prescription       Expected Outcomes Short Term: Able to explain program exercise prescription;Long Term: Able to explain home exercise prescription to exercise independently                Exercise Goals Re-Evaluation :  Exercise Goals Re-Evaluation     Row Name 12/25/21 1125 01/04/22 1437 01/05/22 1114 01/17/22 1558 01/29/22 1120     Exercise Goal Re-Evaluation   Exercise Goals Review Able to understand and use rate of perceived exertion (RPE) scale;Able to understand and use Dyspnea scale;Knowledge and understanding of Target Heart Rate Range (THRR);Understanding of Exercise Prescription Understanding of Exercise Prescription;Increase Physical Activity;Increase Strength and Stamina Understanding of Exercise Prescription;Increase Physical Activity;Able to understand and use rate of perceived exertion (RPE) scale;Knowledge and understanding of Target Heart Rate Range (THRR);Able to understand and use Dyspnea scale;Increase Strength and Stamina;Able to check pulse independently Understanding of Exercise Prescription;Increase Physical Activity;Increase Strength and Stamina Understanding of Exercise Prescription;Increase Physical Activity;Increase Strength and Stamina   Comments Reviewed RPE scale, THR and program prescription with pt today.  Pt voiced understanding and was given a copy of goals to take home. Monish is off to a good start in rehab. He worked at an average MET level of 2.71 METs during his first three sessions. He also was able to work up to level 5 on the XR. He increased his workload on the treadmill to a speed of 2.5 mph and an incline of 1% as well. We will continue to monitor his progress in the program. Reviewed home exercise with pt today.  Pt plans to walk at home for exercise.  Reviewed THR, pulse, RPE, sign and symptoms, pulse oximetery and when to call 911 or MD.  Also discussed weather  considerations and indoor options.  Pt voiced understanding. Ayson continues to do well in rehab. He recently increased his overall average MET level to 3.46 METs. He also increased his speed on the treadmill to 2.8 mph and his incline to 3.5%. He has consistently hit his THR on the treadmill as well. He improved to level 6 on the XR also. We will continue to monitor his progress in the program. Namari is doing well with hisexercise at home. He is walking outdoors for about 15-20 min and then goes on his  recumbent bike for another 15 minutes.  He is doing this for 2 days outside of rehab to equal 5 total/ week. We talked about weather restrictions and to  be mindful its not too cold (typically under 40 degrees) and to stay indoors if so. Encouraged to get something to start checking his HR during exercise such as a pulse ox or fitness watch. We reviewed THR.   Expected Outcomes Short: Use RPE daily to regulate intensity.  Long: Follow program prescription in THR. Short: Continue to increase workloads as tolerated. Long: Continue to increase strength and stamina. Short: Start to add in walking at home consistently Long: Continue to improve stamina Short: Continue to increase workloads as tolerated  Long: Continue to increase strength and stamina. Short: Start checking HR during exercise Long: Continue to exercise independently at home    [place] Name 02/01/22 1610 02/14/22 1138           Exercise Goal Re-Evaluation   Exercise Goals Review Understanding of Exercise Prescription;Increase Physical Activity;Increase Strength and Stamina Understanding of Exercise Prescription;Increase Physical Activity;Increase Strength and Stamina      Comments Mecca continues to do well in the program. He recently increased his overall average MET level to 3.59 METs. He also has consistently hit his THR while walking on the treadmill. He improved to level 7 on the XR as well. We will continue to monitor his progress in the program.  Sanay is doing well in rehab. He has been averaging around 3.8 METs on the treadmill and increased his level to 7 on the XR. He is now using 5 lb for his handweights. We will continue to monitor,      Expected Outcomes Short: Continue to increase workloads to hit THR. Long: Continue to increase strength and stamina. Short: Continue to progressively increase incline on treadmill Long: Continue to increase overall MET level               Discharge Exercise Prescription (Final Exercise Prescription Changes):  Exercise Prescription Changes - 02/14/22 1100       Response to Exercise   Blood Pressure (Admit) 128/74    Blood Pressure (Exit) 118/62    Heart Rate (Admit) 78 bpm    Heart Rate (Exercise) 115 bpm    Heart Rate (Exit) 87 bpm    Rating of Perceived Exertion (Exercise) 15    Symptoms none    Duration Continue with 30 min of aerobic exercise without signs/symptoms of physical distress.    Intensity THRR unchanged      Progression   Progression Continue to progress workloads to maintain intensity without signs/symptoms of physical distress.    Average METs 3.75      Resistance Training   Training Prescription Yes    Weight 5 lb    Reps 10-15      Interval Training   Interval Training No      Treadmill   MPH 2.8    Grade 2    Minutes 15    METs 3.92      REL-XR   Level 7    Minutes 15      Home Exercise Plan   Plans to continue exercise at Home (comment)   walking   Frequency Add 2 additional days to program exercise sessions.    Initial Home Exercises Provided 01/05/22      Oxygen   Maintain Oxygen Saturation 88% or higher             Nutrition:  Target Goals: Understanding of nutrition guidelines, daily intake of sodium <1530m, cholesterol <2056m calories 30% from fat and 7% or less from saturated fats, daily to have 5 or more  servings of fruits and vegetables.  Education: All About Nutrition: -Group instruction provided by verbal, written material,  interactive activities, discussions, models, and posters to present general guidelines for heart healthy nutrition including fat, fiber, MyPlate, the role of sodium in heart healthy nutrition, utilization of the nutrition label, and utilization of this knowledge for meal planning. Follow up email sent as well. Written material given at graduation. Flowsheet Row Cardiac Rehab from 02/21/2022 in [facility] Cardiac and Pulmonary Rehab  Date 01/24/22  Educator [facility]  Instruction Review Code 1- Verbalizes Understanding       Biometrics:  Pre Biometrics - 12/21/21 1306       Pre Biometrics   Height 5' 7.5" (1.715 m)    Weight 203 lb 1.6 oz (92.1 kg)    Waist Circumference 43 inches    Hip Circumference 42.5 inches    Waist to Hip Ratio 1.01 %    BMI (Calculated) 31.32    Single Leg Stand 7.5 seconds   R             Nutrition Therapy Plan and Nutrition Goals:  Nutrition Therapy & Goals - 12/21/21 0914       Nutrition Therapy   Diet Heart healthy, low Na, T2DM MNT    Drug/Food Interactions Statins/Certain Fruits    Protein (specify units) 85-95g    Fiber 31 grams    Whole Grain Foods 3 servings    Saturated Fats 17 max. grams    Fruits and Vegetables 8 servings/day    Sodium 2 grams      Personal Nutrition Goals   Nutrition Goal ST: practice adding volume, fiber, protein, and healthy fats to meals with MyPlate as well as review paperwork LT: achieve and maintain A1C <7, at least 31g of fiber/day, <17g saturated fat/day, follow MyPlate guidelines    Comments 71 y.o. M admitted to cardiac rehab s/p NSTEMI and CABG x3. PMHx includes HTN, HLD, T2DM, stg 3a CKD, OSA, RBBB, GERD. PSHx Left and right TKA 2021. Relevant medications includes lipitor, furosemide, metformin, omeprazole. Jamile and [name] reports making changes recently to both of their diets after his diabetes diagnosis and after speaking with the doctor about his heart health. Food recall: Coffee (zero sugar creamer) B: 1  glass of 1% milk or tomato juice with breakfast. 1 egg from 2 eggs with 1.5 pieces of sausage or bacon and grits (no longer putting sugar in it) or cheerios (honey nut) or oatmeal with some butter and honey L: sandwich (whole wheat) with ham and cheese with olive oil and vinegar dressing and mustard or light mayo D: vaires: wife will cook dinner. Lean ground [place] with spaghetti (they have switched to ground [place] and chicken mostly for protein - grilling or baking normally). he likes pinto beans, peas, carrots, turnip greens, bell peppers, and onions. Drinks: lemonade (crystal lite), sometimes small coke zero. Occassionally will have a beer or 2. Discussed heart healthy eating and MyPlate guidelines as well as considerations for BG management and T2DM. his wife is interested in mixing cauliflower rice with regular rice and trying lentils.      Intervention Plan   Intervention Prescribe, educate and counsel regarding individualized specific dietary modifications aiming towards targeted core components such as weight, hypertension, lipid management, diabetes, heart failure and other comorbidities.;Nutrition handout(s) given to patient.    Expected Outcomes Short Term Goal: Understand basic principles of dietary content, such as calories, fat, sodium, cholesterol and nutrients.;Short Term Goal: A plan has been developed  with personal nutrition goals set during dietitian appointment.;Long Term Goal: Adherence to prescribed nutrition plan.             Nutrition Assessments:  MEDIFICTS Score Key: ?70 Need to make dietary changes  40-70 Heart Healthy Diet ? 40 Therapeutic Level Cholesterol Diet  Flowsheet Row Cardiac Rehab from 12/25/2021 in [facility] Cardiac and Pulmonary Rehab  Picture Your Plate Total Score on Admission 55      Picture Your Plate Scores: <92 Unhealthy dietary pattern with much room for improvement. 41-50 Dietary pattern unlikely to meet recommendations for good health and room  for improvement. 51-60 More healthful dietary pattern, with some room for improvement.  >60 Healthy dietary pattern, although there may be some specific behaviors that could be improved.    Nutrition Goals Re-Evaluation:  Nutrition Goals Re-Evaluation     [place] Name 01/05/22 1119 01/29/22 1123           Goals   Current Weight 201 lb (91.2 kg) --      Nutrition Goal Control portion size. ST: practice adding volume, fiber, protein, and healthy fats to meals with MyPlate as well as review paperwork LT: achieve and maintain A1C <7, at least 31g of fiber/day, <17g saturated fat/day, follow MyPlate guidelines      [place] wants to lose weight but feels like he eats to much. Informed him to use a smaller plate and watch his portion sizes He is going to try to use smaller plates to help with portion controls. [place] is still working on his portion size with his meals. His wife does a lot of the cooking and controls a lot on what goes on his plate. They are eye-balling the portions but he was also encouraged to look at objects (like your hands) that compare to specific portion sizes for a visual.  He is trying to add more veggies in his dinners. He is not a fan of fruit. and hasn't tried to any more of it. We talked about different alternatives to trying out smoothies and trying out different combinations with trial and error. He is very hesitant. He has however, enjoyed eating almonds for a snack and extra protein. He has tried boost but was not a fan of the taste.      Expected Outcome Short: use smaller plate when eating. Long: lose more weight. Short: Continue to work on visuals for portion control Long: Continue to eat heart healthy               Nutrition Goals Discharge (Final Nutrition Goals Re-Evaluation):  Nutrition Goals Re-Evaluation - 01/29/22 1123       Goals   Nutrition Goal ST: practice adding volume, fiber, protein, and healthy fats to meals with MyPlate as well as review  paperwork LT: achieve and maintain A1C <7, at least 31g of fiber/day, <17g saturated fat/day, follow MyPlate guidelines    Comment Egidio is still working on his portion size with his meals. His wife does a lot of the cooking and controls a lot on what goes on his plate. They are eye-balling the portions but he was also encouraged to look at objects (like your hands) that compare to specific portion sizes for a visual.  He is trying to add more veggies in his dinners. He is not a fan of fruit. and hasn't tried to any more of it. We talked about different alternatives to trying out smoothies and trying out different combinations with trial and error. He is very  hesitant. He has however, enjoyed eating almonds for a snack and extra protein. He has tried boost but was not a fan of the taste.    Expected Outcome Short: Continue to work on visuals for portion control Long: Continue to eat heart healthy             Psychosocial: Target Goals: Acknowledge presence or absence of significant depression and/or stress, maximize coping skills, provide positive support system. Participant is able to verbalize types and ability to use techniques and skills needed for reducing stress and depression.   Education: Stress, Anxiety, and Depression - Group verbal and visual presentation to define topics covered.  Reviews how body is impacted by stress, anxiety, and depression.  Also discusses healthy ways to reduce stress and to treat/manage anxiety and depression.  Written material given at graduation.   Education: Sleep Hygiene -Provides group verbal and written instruction about how sleep can affect your health.  Define sleep hygiene, discuss sleep cycles and impact of sleep habits. Review good sleep hygiene tips.    Initial Review & Psychosocial Screening:  Initial Psych Review & Screening - 12/14/21 1342       Initial Review   Current issues with None Identified      Family Dynamics   Good Support  System? Yes   wife  married 66 years     Barriers   Psychosocial barriers to participate in program There are no identifiable barriers or psychosocial needs.      Screening Interventions   Interventions Encouraged to exercise;To provide support and resources with identified psychosocial needs;Provide feedback about the scores to participant    Expected Outcomes Short Term goal: Utilizing psychosocial counselor, staff and physician to assist with identification of specific Stressors or current issues interfering with healing process. Setting desired goal for each stressor or current issue identified.;Long Term Goal: Stressors or current issues are controlled or eliminated.;Short Term goal: Identification and review with participant of any Quality of Life or Depression concerns found by scoring the questionnaire.;Long Term goal: The participant improves quality of Life and PHQ9 Scores as seen by post scores and/or verbalization of changes             Quality of Life Scores:   Quality of Life - 12/25/21 1625       Quality of Life   Select Quality of Life      Quality of Life Scores   Health/Function Pre 16.37 %    Socioeconomic Pre 23.79 %    Psych/Spiritual Pre 24 %    Family Pre 27.6 %    GLOBAL Pre 21.12 %            Scores of 19 and below usually indicate a poorer quality of life in these areas.  A difference of  2-3 points is a clinically meaningful difference.  A difference of 2-3 points in the total score of the Quality of Life Index has been associated with significant improvement in overall quality of life, self-image, physical symptoms, and general health in studies assessing change in quality of life.  PHQ-9: Review Flowsheet       12/21/2021 02/07/2015  Depression screen PHQ 2/9  Decreased Interest 0 0  Down, Depressed, Hopeless 0 0  PHQ - 2 Score 0 0  Altered sleeping 0 -  Tired, decreased energy 1 -  Change in appetite 0 -  Feeling bad or failure about  yourself  0 -  Trouble concentrating 0 -  Moving slowly or  fidgety/restless 0 -  Suicidal thoughts 0 -  PHQ-9 Score 1 -  Difficult doing work/chores Somewhat difficult -   Interpretation of Total Score  Total Score Depression Severity:  1-4 = Minimal depression, 5-9 = Mild depression, 10-14 = Moderate depression, 15-19 = Moderately severe depression, 20-27 = Severe depression   Psychosocial Evaluation and Intervention:  Psychosocial Evaluation - 12/14/21 1350       Psychosocial Evaluation & Interventions   Interventions Encouraged to exercise with the program and follow exercise prescription    Comments Tamon has no barriers to attending the program. He lives with his wife of 66 years. She is his support. He wants to get back to normal. He does play golf. He is ready to start the program    Expected Outcomes STG Kevonte attends all scheduled sessions, he is able to progress his exercise to the point he can get back to his golf game. LTG Daren continues to work on his exercise progression after discharge.    Continue Psychosocial Services  Follow up required by staff             Psychosocial Re-Evaluation:  Psychosocial Re-Evaluation     [place] Name 01/05/22 1121 01/29/22 1146           Psychosocial Re-Evaluation   Current issues with None Identified Current Sleep Concerns      Comments Patient reports no issues with their current mental states, sleep, stress, depression or anxiety. Will follow up with patient in a few weeks for any changes. Ramello is doing well mentally. He does not take any medications for emotional support. He has quite a few appts coming up to assess his carotids as he may need surgery soon. It was found during his surgert. His sleep has been "ok" - he gets up on the middle of the night becasue he uses the bathroom which is caused by his diuertic. He is thinking of asking his doctor if he can take his medication a different time so he is not all up all night going  to the bathroom. His wife is a good support.      Expected Outcomes Short: Continue to exercise regularly to support mental health and notify staff of any changes. Long: maintain mental health and well being through teaching of rehab or prescribed medications independently. Short: Ask doctor about timing of diuertic Long: Continue to maintain posititve attitude and utilize exercise for stress management      Interventions Encouraged to attend Cardiac Rehabilitation for the exercise Encouraged to attend Cardiac Rehabilitation for the exercise      Continue Psychosocial Services  Follow up required by staff Follow up required by staff               Psychosocial Discharge (Final Psychosocial Re-Evaluation):  Psychosocial Re-Evaluation - 01/29/22 1146       Psychosocial Re-Evaluation   Current issues with Current Sleep Concerns    Comments [place] is doing well mentally. He does not take any medications for emotional support. He has quite a few appts coming up to assess his carotids as he may need surgery soon. It was found during his surgert. His sleep has been "ok" - he gets up on the middle of the night becasue he uses the bathroom which is caused by his diuertic. He is thinking of asking his doctor if he can take his medication a different time so he is not all up all night going to the bathroom. His wife is a  good support.    Expected Outcomes Short: Ask doctor about timing of diuertic Long: Continue to maintain posititve attitude and utilize exercise for stress management    Interventions Encouraged to attend Cardiac Rehabilitation for the exercise    Continue Psychosocial Services  Follow up required by staff             Vocational Rehabilitation: Provide vocational rehab assistance to qualifying candidates.   Vocational Rehab Evaluation & Intervention:   Education: Education Goals: Education classes will be provided on a variety of topics geared toward better understanding of  heart health and risk factor modification. Participant will state understanding/return demonstration of topics presented as noted by education test scores.  Learning Barriers/Preferences:  Learning Barriers/Preferences - 12/14/21 1343       Learning Barriers/Preferences   Learning Barriers None    Learning Preferences None             General Cardiac Education Topics:  AED/CPR: - Group verbal and written instruction with the use of models to demonstrate the basic use of the AED with the basic ABC's of resuscitation.   Anatomy and Cardiac Procedures: - Group verbal and visual presentation and models provide information about basic cardiac anatomy and function. Reviews the testing methods done to diagnose heart disease and the outcomes of the test results. Describes the treatment choices: Medical Management, Angioplasty, or Coronary Bypass Surgery for treating various heart conditions including Myocardial Infarction, Angina, Valve Disease, and Cardiac Arrhythmias.  Written material given at graduation. Flowsheet Row Cardiac Rehab from 02/21/2022 in [facility] Cardiac and Pulmonary Rehab  Date 01/31/22  Educator [facility]  Instruction Review Code 1- Verbalizes Understanding       Medication Safety: - Group verbal and visual instruction to review commonly prescribed medications for heart and lung disease. Reviews the medication, class of the drug, and side effects. Includes the steps to properly store meds and maintain the prescription regimen.  Written material given at graduation.   Intimacy: - Group verbal instruction through game format to discuss how heart and lung disease can affect sexual intimacy. Written material given at graduation.. Flowsheet Row Cardiac Rehab from 02/21/2022 in [facility] Cardiac and Pulmonary Rehab  Date 01/03/22  Educator [facility]  Instruction Review Code 1- Verbalizes Understanding       Know Your Numbers and Heart Failure: - Group verbal and visual instruction to  discuss disease risk factors for cardiac and pulmonary disease and treatment options.  Reviews associated critical values for Overweight/Obesity, Hypertension, Cholesterol, and Diabetes.  Discusses basics of heart failure: signs/symptoms and treatments.  Introduces Heart Failure Zone chart for action plan for heart failure.  Written material given at graduation.   Infection Prevention: - Provides verbal and written material to individual with discussion of infection control including proper hand washing and proper equipment cleaning during exercise session.   Falls Prevention: - Provides verbal and written material to individual with discussion of falls prevention and safety. Flowsheet Row Cardiac Rehab from 02/21/2022 in [facility] Cardiac and Pulmonary Rehab  Date 12/14/21  Educator SB  Instruction Review Code 1- Verbalizes Understanding       Other: -Provides group and verbal instruction on various topics (see comments)   Knowledge Questionnaire Score:  Knowledge Questionnaire Score - 12/25/21 1624       Knowledge Questionnaire Score   Pre Score 23/26             Core Components/Risk Factors/Patient Goals at Admission:  Personal Goals and Risk Factors at Admission - 12/21/21  1306       Core Components/Risk Factors/Patient Goals on Admission    Weight Management Yes    Intervention Weight Management: Develop a combined nutrition and exercise program designed to reach desired caloric intake, while maintaining appropriate intake of nutrient and fiber, sodium and fats, and appropriate energy expenditure required for the weight goal.;Weight Management: Provide education and appropriate resources to help participant work on and attain dietary goals.;Weight Management/Obesity: Establish reasonable short term and long term weight goals.    Admit Weight 203 lb 1.6 oz (92.1 kg)    Goal Weight: Short Term 196 lb (88.9 kg)    Goal Weight: Long Term 180 lb (81.6 kg)    Expected Outcomes  Short Term: Continue to assess and modify interventions until short term weight is achieved;Long Term: Adherence to nutrition and physical activity/exercise program aimed toward attainment of established weight goal;Weight Loss: Understanding of general recommendations for a balanced deficit meal plan, which promotes 1-2 lb weight loss per week and includes a negative energy balance of [phone] kcal/d    Diabetes Yes    Intervention Provide education about signs/symptoms and action to take for hypo/hyperglycemia.;Provide education about proper nutrition, including hydration, and aerobic/resistive exercise prescription along with prescribed medications to achieve blood glucose in normal ranges: Fasting glucose 65-99 mg/dL    Expected Outcomes Short Term: Participant verbalizes understanding of the signs/symptoms and immediate care of hyper/hypoglycemia, proper foot care and importance of medication, aerobic/resistive exercise and nutrition plan for blood glucose control.;Long Term: Attainment of HbA1C < 7%.    Hypertension Yes    Intervention Provide education on lifestyle modifcations including regular physical activity/exercise, weight management, moderate sodium restriction and increased consumption of fresh fruit, vegetables, and low fat dairy, alcohol moderation, and smoking cessation.;Monitor prescription use compliance.    Expected Outcomes Short Term: Continued assessment and intervention until BP is < 140/23m HG in hypertensive participants. < 130/835mHG in hypertensive participants with diabetes, heart failure or chronic kidney disease.;Long Term: Maintenance of blood pressure at goal levels.    Lipids Yes    Intervention Provide education and support for participant on nutrition & aerobic/resistive exercise along with prescribed medications to achieve LDL <7069mHDL >43m56m  Expected Outcomes Short Term: Participant states understanding of desired cholesterol values and is compliant with  medications prescribed. Participant is following exercise prescription and nutrition guidelines.;Long Term: Cholesterol controlled with medications as prescribed, with individualized exercise RX and with personalized nutrition plan. Value goals: LDL < 70mg61mL > 40 mg.             Education:Diabetes - Individual verbal and written instruction to review signs/symptoms of diabetes, desired ranges of glucose level fasting, after meals and with exercise. Acknowledge that pre and post exercise glucose checks will be done for 3 sessions at entry of program.   Core Components/Risk Factors/Patient Goals Review:   Goals and Risk Factor Review     Row Name 01/05/22 1116 01/29/22 1141           Core Components/Risk Factors/Patient Goals Review   Personal Goals Review Weight Management/Obesity Weight Management/Obesity;Diabetes;Hypertension;Lipids      Review HenryDaivonbeen doing well since the start of the program. He wants to be able to play golf again without getting short of breath. He is trying to lose some weight. He has lose two pounds since the start of the program snd would like to reach a weight goal of 180 pounds. He is going to try to reach obtainable  goals and lose weight little by little. Homero admits he has not lost weight because he is eating food he knows he is eating too much of. He used to be down under 190 but is over 200 lb. His goals weight is 180 lb. He does check his blood sugar every morning and it has been running higher than normal, around 150. He was encouraged to let his doctor know and to send a log over. He has an appt with them on 1/8- encouraged to do it before then. He does have a  BP cuff to use but does not check it at home. He is going to try to be more diligent with it. Talked about importance on maintaining at home. BP at rehab are stable. He is taking all his  medications as prescribed.      Expected Outcomes Short: lose 5 pounds in the next few weeks. Long:  maintain reach weight goal. Short: Continue to work on weight loss and start checking BP at sugars at home, talk to doctor about sugars Long: Continue to manage lifestyle risk factors               Core Components/Risk Factors/Patient Goals at Discharge (Final Review):   Goals and Risk Factor Review - 01/29/22 1141       Core Components/Risk Factors/Patient Goals Review   Personal Goals Review Weight Management/Obesity;Diabetes;Hypertension;Lipids    Review Jakori admits he has not lost weight because he is eating food he knows he is eating too much of. He used to be down under 190 but is over 200 lb. His goals weight is 180 lb. He does check his blood sugar every morning and it has been running higher than normal, around 150. He was encouraged to let his doctor know and to send a log over. He has an appt with them on 1/8- encouraged to do it before then. He does have a  BP cuff to use but does not check it at home. He is going to try to be more diligent with it. Talked about importance on maintaining at home. BP at rehab are stable. He is taking all his  medications as prescribed.    Expected Outcomes Short: Continue to work on weight loss and start checking BP at sugars at home, talk to doctor about sugars Long: Continue to manage lifestyle risk factors             ITP Comments:  ITP Comments     Row Name 12/14/21 1354 12/21/21 1246 12/25/21 1125 12/27/21 1041 01/24/22 1107   ITP Comments Virtual orientation call completed today. he has an appointment on Date: 12/21/2021  for EP eval and gym Orientation.  Documentation of diagnosis [facility] be found in [facility] Date: 11/22/2021 . Completed 6MWT and gym orientation. Initial ITP created and sent for review to Dr Ramonita Lab, Medical Director. First full day of exercise!  Patient was oriented to gym and equipment including functions, settings, policies, and procedures.  Patient's individual exercise prescription and treatment plan were reviewed.  All  starting workloads were established based on the results of the 6 minute walk test done at initial orientation visit.  The plan for exercise progression was also introduced and progression will be customized based on patient's performance and goals. 30 Day review completed. Medical Director ITP review done, changes made as directed, and signed approval by Medical Director.    NEW TO PROGRAM 30 Day review completed. Medical Director ITP review done, changes made as  directed, and signed approval by Medical Director.    [place] Name 02/21/22 1133           ITP Comments 30 Day review completed. Medical Director ITP review done, changes made as directed, and signed approval by Medical Director.                Comments:

## 2022-02-22 ENCOUNTER — Ambulatory Visit
Admission: RE | Admit: 2022-02-22 | Discharge: 2022-02-22 | Disposition: A | Discharge status: Home / Self Care | Payer: Medicare Other | Source: Ambulatory Visit | Attending: Vascular Surgery | Admitting: Vascular Surgery

## 2022-02-22 DIAGNOSIS — I6523 Occlusion and stenosis of bilateral carotid arteries: Secondary | ICD-10-CM | POA: Diagnosis present

## 2022-02-22 LAB — POCT I-STAT CREATININE: Creatinine, Ser: 1.1 mg/dL (ref 0.61–1.24)

## 2022-02-22 MED ORDER — IOHEXOL 350 MG/ML SOLN
75.0000 mL | Freq: Once | INTRAVENOUS | Status: AC | PRN
  Administered 2022-02-22: 75 mL via INTRAVENOUS

## 2022-02-23 ENCOUNTER — Encounter: Payer: Medicare Other | Admitting: *Deleted

## 2022-02-23 DIAGNOSIS — I214 Non-ST elevation (NSTEMI) myocardial infarction: Secondary | ICD-10-CM

## 2022-02-23 DIAGNOSIS — Z951 Presence of aortocoronary bypass graft: Secondary | ICD-10-CM

## 2022-02-23 NOTE — Progress Notes (Signed)
Daily Session Note  Patient Details  Name: Samuel [name] MRN: [id number] Date of Birth: 1950-12-22 Referring Provider:   Flowsheet Row Cardiac Rehab from 12/21/2021 in [facility] Cardiac and Pulmonary Rehab  Referring Provider Isaias Cowman, MD       Encounter Date: 02/23/2022  Check In:  Session Check In - 02/23/22 1144       Check-In   Supervising physician immediately available to respond to emergencies See telemetry face sheet for immediately available ER MD    Location [facility]-Cardiac & Pulmonary Rehab    Staff Present Heath Lark, RN, BSN, CCRP;Jessica [place], MA, RCEP, CCRP, CCET;Joseph [place], [place]    Virtual Visit No    Medication changes reported     No    Fall or balance concerns reported    No    Warm-up and Cool-down Performed on first and last piece of equipment    Resistance Training Performed Yes    VAD Patient? No    PAD/SET Patient? No      Pain Assessment   Currently in Pain? No/denies                Social History   Tobacco Use  Smoking Status Never  Smokeless Tobacco Never    Goals Met:  Independence with exercise equipment Exercise tolerated well No report of concerns or symptoms today  Goals Unmet:  Not Applicable  Comments: Pt able to follow exercise prescription today without complaint.  Will continue to monitor for progression.    Dr. Emily Filbert is Medical Director for [place].  Dr. Ottie Glazier is Medical Director for [facility] Pulmonary Rehabilitation.

## 2022-02-27 ENCOUNTER — Encounter (INDEPENDENT_AMBULATORY_CARE_PROVIDER_SITE_OTHER): Payer: Self-pay | Admitting: Vascular Surgery

## 2022-02-27 ENCOUNTER — Ambulatory Visit (INDEPENDENT_AMBULATORY_CARE_PROVIDER_SITE_OTHER): Payer: Medicare Other | Admitting: Vascular Surgery

## 2022-02-27 VITALS — BP 130/82 | Resp 16 | Ht 67.0 in | Wt 211.0 lb

## 2022-02-27 DIAGNOSIS — M79606 Pain in leg, unspecified: Secondary | ICD-10-CM

## 2022-02-27 DIAGNOSIS — I10 Essential (primary) hypertension: Secondary | ICD-10-CM | POA: Diagnosis not present

## 2022-02-27 DIAGNOSIS — I6523 Occlusion and stenosis of bilateral carotid arteries: Secondary | ICD-10-CM

## 2022-02-27 DIAGNOSIS — E785 Hyperlipidemia, unspecified: Secondary | ICD-10-CM | POA: Diagnosis not present

## 2022-02-27 DIAGNOSIS — E1122 Type 2 diabetes mellitus with diabetic chronic kidney disease: Secondary | ICD-10-CM | POA: Diagnosis not present

## 2022-02-27 DIAGNOSIS — N182 Chronic kidney disease, stage 2 (mild): Secondary | ICD-10-CM

## 2022-02-27 NOTE — Assessment & Plan Note (Addendum)
he has undergone a CT angiogram which I have independently reviewed.  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  The official report is of less than 50% left carotid artery stenosis although would estimate this more in the 50 to 60% range.  At this point, he clearly should have right carotid intervention to reduce his risk of stroke going forward.  We discussed the difference with carotid endarterectomy and carotid artery stenting.  We discussed the rationale for treatment.  He is on appropriate medical therapy.  His he seems to be acceptable for either option.  After discussions with the patient, carotid stenting was selected and he will be scheduled for that near future at his convenience.

## 2022-02-27 NOTE — Progress Notes (Signed)
MRN : [id number]  Samuel [name] is a 72 y.o. (1950/11/10) male who presents with chief complaint of  Chief Complaint  Patient presents with   Follow-up    Review CT  .  History of Present Illness: Patient returns today in follow up of his carotid disease.  He has continued his cardiac rehab and is doing well.  He is scheduled to see his cardiologist in about 2 weeks to be fully cleared after his coronary artery bypass grafting about 3 months ago.  He was found to have significant carotid disease by duplex.  Since his last visit, he has undergone a CT angiogram which I have independently reviewed.  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  The official report is of less than 50% left carotid artery stenosis although would estimate this more in the 50 to 60% range.  He has not had any recent focal neurologic symptoms but does have a previous history of stroke.  He remains on aspirin, Plavix, and high-dose Lipitor.  Current Outpatient Medications  Medication Sig Dispense Refill   acetaminophen (TYLENOL) 500 MG tablet Take 1-2 tablets (500-1,000 mg total) by mouth every 6 (six) hours as needed. 30 tablet 0   amLODipine (NORVASC) 5 MG tablet Take 5 mg by mouth daily.     aspirin 81 MG EC tablet Take 81 mg by mouth daily.      atorvastatin (LIPITOR) 80 MG tablet Take 1 tablet (80 mg total) by mouth daily. 30 tablet 0   clopidogrel (PLAVIX) 75 MG tablet Take 1 tablet (75 mg total) by mouth daily. 30 tablet 11   lisinopril-hydrochlorothiazide (ZESTORETIC) 20-25 MG tablet Take 1 tablet by mouth daily.     loratadine (CLARITIN) 10 MG tablet Take 10 mg by mouth daily.     metFORMIN (GLUCOPHAGE) 500 MG tablet Take 1 tablet (500 mg total) by mouth 2 (two) times daily with a meal.     metoprolol tartrate (LOPRESSOR) 25 MG tablet Take 0.5 tablets (12.5 mg total) by mouth 2 (two) times daily. 60 tablet 3   nitroGLYCERIN (NITROSTAT) 0.4 MG  SL tablet Place under the tongue.     omeprazole (PRILOSEC) 20 MG capsule Take 20 mg by mouth daily before breakfast.     ONETOUCH ULTRA test strip CHECK BLOOD SUGAR TWICE DAILY.     oxybutynin (DITROPAN-XL) 5 MG 24 hr tablet Take 5 mg by mouth daily.     pravastatin (PRAVACHOL) 40 MG tablet Take 40 mg by mouth daily.     No current facility-administered medications for this visit.    Past Medical History:  Diagnosis Date   Anxiety    situational   Arthritis    Cancer ([place])    squamous cell carcinoma   ED (erectile dysfunction)    GERD (gastroesophageal reflux disease)    Hyperlipidemia    Hypertension    OSA (obstructive sleep apnea)    CPAP   Stroke ([place])    no residual    Uncontrolled type 2 diabetes mellitus with hyperglycemia, with long-term current use of insulin (HCC)     Past Surgical History:  Procedure Laterality Date   CARDIAC CATHETERIZATION     COLONOSCOPY     CORONARY ARTERY BYPASS GRAFT N/A 11/24/2021   Procedure: CORONARY ARTERY BYPASS GRAFTING (CABG) X3 BYPASSES, USING OPEN LEFT INTERNAL MAMMARY ARTERY AND ENDOSCOPIC RIGHT GREATER SAPHENOUS VEIN HARVEST;  Surgeon: Coralie Common, MD;  Location: [facility];  Service: Open Heart Surgery;  Laterality: N/A;   KNEE ARTHROPLASTY Left 03/20/2019   Procedure: LEFT COMPUTER ASSISTED TOTAL KNEE ARTHROPLASTY;  Surgeon: Dereck Leep, MD;  Location: [facility];  Service: Orthopedics;  Laterality: Left;   KNEE ARTHROPLASTY Right 11/09/2019   Procedure: COMPUTER ASSISTED TOTAL KNEE ARTHROPLASTY;  Surgeon: Dereck Leep, MD;  Location: [facility];  Service: Orthopedics;  Laterality: Right;   KNEE ARTHROSCOPY Left    LEFT HEART CATH N/A 11/21/2021   Procedure: Left Heart Cath;  Surgeon: Isaias Cowman, MD;  Location: [facility];  Service: Cardiovascular;  Laterality: N/A;   SHOULDER ARTHROSCOPY Right 2009   rotator cuff   TEE WITHOUT CARDIOVERSION N/A 11/24/2021   Procedure: TRANSESOPHAGEAL ECHOCARDIOGRAM (TEE);   Surgeon: Coralie Common, MD;  Location: [facility];  Service: Open Heart Surgery;  Laterality: N/A;   TONSILLECTOMY       Social History   Tobacco Use   Smoking status: Never   Smokeless tobacco: Never  Vaping Use   Vaping Use: Never used  Substance Use Topics   Alcohol use: Yes    Alcohol/week: 2.0 standard drinks of alcohol    Types: 2 Standard drinks or equivalent per week   Drug use: Never     Family History  Problem Relation Age of Onset   CAD Sister    CAD Brother   Father had AAA  No Known Allergies   REVIEW OF SYSTEMS (Negative unless checked)  Constitutional: '[]'$ Weight loss  '[]'$ Fever  '[]'$ Chills Cardiac: '[]'$ Chest pain   '[]'$ Chest pressure   '[]'$ Palpitations   '[]'$ Shortness of breath when laying flat   '[]'$ Shortness of breath at rest   '[x]'$ Shortness of breath with exertion. Vascular:  '[]'$ Pain in legs with walking   '[]'$ Pain in legs at rest   '[]'$ Pain in legs when laying flat   '[]'$ Claudication   '[]'$ Pain in feet when walking  '[]'$ Pain in feet at rest  '[]'$ Pain in feet when laying flat   '[]'$ History of DVT   '[]'$ Phlebitis   '[]'$ Swelling in legs   '[]'$ Varicose veins   '[]'$ Non-healing ulcers Pulmonary:   '[]'$ Uses home oxygen   '[]'$ Productive cough   '[]'$ Hemoptysis   '[]'$ Wheeze  '[]'$ COPD   '[]'$ Asthma Neurologic:  '[]'$ Dizziness  '[]'$ Blackouts   '[]'$ Seizures   '[x]'$ History of stroke   '[]'$ History of TIA  '[]'$ Aphasia   '[]'$ Temporary blindness   '[]'$ Dysphagia   '[]'$ Weakness or numbness in arms   '[]'$ Weakness or numbness in legs Musculoskeletal:  '[x]'$ Arthritis   '[]'$ Joint swelling   '[x]'$ Joint pain   '[]'$ Low back pain Hematologic:  '[]'$ Easy bruising  '[]'$ Easy bleeding   '[]'$ Hypercoagulable state   '[]'$ Anemic   Gastrointestinal:  '[]'$ Blood in stool   '[]'$ Vomiting blood  '[]'$ Gastroesophageal reflux/heartburn   '[]'$ Abdominal pain Genitourinary:  '[]'$ Chronic kidney disease   '[]'$ Difficult urination  '[]'$ Frequent urination  '[]'$ Burning with urination   '[]'$ Hematuria Skin:  '[]'$ Rashes   '[]'$ Ulcers   '[]'$ Wounds Psychological:  '[]'$ History of anxiety   '[]'$  History of major depression.  Physical  Examination  BP 130/82 (BP Location: Right Arm)   Resp 16   Ht '5\' 7"'$  (1.702 m)   Wt 211 lb (95.7 kg)   BMI 33.05 kg/m  Gen:  WD/WN, NAD Head: [place]/AT, No temporalis wasting. Ear/Nose/Throat: Hearing grossly intact, nares w/o erythema or drainage Eyes: Conjunctiva clear. Sclera non-icteric Neck: Supple.  Trachea midline Pulmonary:  Good air movement, no use of accessory muscles.  Cardiac: RRR, no JVD Vascular:  Vessel Right Left  Radial Palpable Palpable  Musculoskeletal: M/S 5/5 throughout.  No deformity or atrophy. Trace LE edema. Neurologic: Sensation grossly intact in extremities.  Symmetrical.  Speech is fluent.  Psychiatric: Judgment intact, Mood & affect appropriate for pt's clinical situation. Dermatologic: No rashes or ulcers noted.  No cellulitis or open wounds.      Labs Recent Results (from the past 2160 hour(s))  Glucose, capillary     Status: Abnormal   Collection Time: 12/25/21 10:59 AM  Result Value Ref Range   Glucose-Capillary 140 (H) 70 - 99 mg/dL    Comment: Glucose reference range applies only to samples taken after fasting for at least 8 hours.  Glucose, capillary     Status: Abnormal   Collection Time: 12/27/21 10:24 AM  Result Value Ref Range   Glucose-Capillary 136 (H) 70 - 99 mg/dL    Comment: Glucose reference range applies only to samples taken after fasting for at least 8 hours.  Glucose, capillary     Status: None   Collection Time: 12/27/21 11:57 AM  Result Value Ref Range   Glucose-Capillary 92 70 - 99 mg/dL    Comment: Glucose reference range applies only to samples taken after fasting for at least 8 hours.  Glucose, capillary     Status: Abnormal   Collection Time: 12/29/21 10:57 AM  Result Value Ref Range   Glucose-Capillary 184 (H) 70 - 99 mg/dL    Comment: Glucose reference range applies only to samples taken after fasting for at least 8 hours.  Glucose, capillary     Status: Abnormal   Collection Time: 12/29/21 12:02 PM   Result Value Ref Range   Glucose-Capillary 141 (H) 70 - 99 mg/dL    Comment: Glucose reference range applies only to samples taken after fasting for at least 8 hours.  I-STAT creatinine     Status: None   Collection Time: 02/22/22  9:28 AM  Result Value Ref Range   Creatinine, Ser 1.10 0.61 - 1.24 mg/dL    Radiology CT Angio Neck W/Cm &/Or Wo/Cm  Result Date: 02/24/2022 CLINICAL DATA:  72 year old male with abnormal carotid Doppler ultrasound in September. Currently asymptomatic. EXAM: CT ANGIOGRAPHY NECK TECHNIQUE: Multidetector CT imaging of the neck was performed using the standard protocol during bolus administration of intravenous contrast. Multiplanar CT image reconstructions and MIPs were obtained to evaluate the vascular anatomy. Carotid stenosis measurements (when applicable) are obtained utilizing NASCET criteria, using the distal internal carotid diameter as the denominator. RADIATION DOSE REDUCTION: This exam was performed according to the departmental dose-optimization program which includes automated exposure control, adjustment of the mA and/or kV according to patient size and/or use of iterative reconstruction technique. CONTRAST:  110m OMNIPAQUE IOHEXOL 350 MG/ML SOLN COMPARISON:  CTA chest 11/20/2021. FINDINGS: Skeleton: Partially visible sternotomy. Cervical spine degeneration with C2-C3 ankylosis. Mild Diffuse idiopathic skeletal hyperostosis (DISH). Left TMJ degeneration. Stable visible upper thoracic spine palm including diffuse idiopathic skeletal hyperostosis (DISH) with flowing endplate osteophytes and ankylosis. Mild chronic T3 superior endplate compression, and more moderate chronic T4 vertebral body compression with underlying lucent lesion that most resembles benign hemangioma or less likely Paget's disease. No acute osseous abnormality identified. Upper chest: Negative visible superior mediastinum. Visible upper lungs are stable. Other neck: No acute neck soft tissue  finding. Grossly negative visible brain parenchyma, orbits. Aortic arch: Prior CABG. Calcified aortic atherosclerosis. Three vessel arch configuration. Tortuous proximal great vessels. Right carotid system: Tortuous brachiocephalic artery with a kinked appearance in the superior mediastinum on series 8, image 137. But mild  brachiocephalic artery plaque. Negative right CCA origin. No significant right carotid plaque before the bifurcation. However, at the right ICA origin and bulb there is bulky combined soft and calcified plaque or mural thrombus. Subsequent right ICA origin stenosis with radiographic string sign (series 5, image 51 and series 10, image 85) over a 2-3 mm segment. Additional calcified plaque at the right ICA bulb and up to 50% distal bulb stenosis. But the vessel remains patent to the skull base. Visible right ICA siphon is patent. Left carotid system: Mild left CCA origin plaque without stenosis. Tortuous proximal left CCA. Calcified plaque at the left ICA origin and bulb. Superimposed bulky soft plaque also at the distal bulb (series 10, image 151). But less than 50 % stenosis with respect to the distal vessel. Left ICA remains patent to the skull base with negative visible left ICA siphon. Vertebral arteries: Mostly calcified right subclavian artery origin plaque without significant stenosis. Calcified right vertebral artery origin and proximal V1 segment with moderate stenosis on series 6, image 170. Tortuous right vertebral distal to the stenosis. Right vertebral remains patent to the vertebrobasilar junction with mild V4 calcified plaque. Right AICA appears to be dominant. Proximal left subclavian artery calcified plaque without significant stenosis. Normal left vertebral artery origin. Tortuous left V1 segment. Fairly codominant left vertebral artery is tortuous in the neck and patent to the skull base without stenosis. But there is left V4 bulky calcified plaque in the posterior fossa with  moderate to severe stenosis on series 8, image 100. An additional tandem high-grade stenosis just proximal to the vertebrobasilar junction (series 6, image 14). Left PICA is diminutive or absent. Review of the MIP images confirms the above findings IMPRESSION: 1. Positive for bulky soft complex plaque and/or mural thrombus at the Right ICA origin with a short segment RADIOGRAPHIC STRING SIGN stenosis. And up to 50% tandem stenosis at the Right bulb. 2. Combined soft and calcified plaque at the Left ICA origin and bulb, but no significant stenosis. 3. Bulky atherosclerosis of the Distal Left Vertebral Artery with tandem moderate to severe V4 stenoses. 4. Moderate Right Vertebral Artery V1 segment stenosis. 5.  Aortic Atherosclerosis (ICD10-I70.0).  Prior CABG. Electronically Signed   By: Genevie Ann M.D.   On: 02/24/2022 13:20    Assessment/Plan Essential hypertension blood pressure control important in reducing the progression of atherosclerotic disease. On appropriate oral medications.     Diabetes mellitus with stage 2 chronic kidney disease (HCC) blood glucose control important in reducing the progression of atherosclerotic disease. Also, involved in wound healing. On appropriate medications.     Dyslipidemia lipid control important in reducing the progression of atherosclerotic disease. Continue statin therapy     Pain in limb His ABIs showed no significant lower extremity arterial insufficiency.   Carotid artery stenosis, asymptomatic, bilateral he has undergone a CT angiogram which I have independently reviewed.  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  The official report is of less than 50% left carotid artery stenosis although would estimate this more in the 50 to 60% range.  At this point, he clearly should have right carotid intervention to reduce his risk of stroke going forward.  We discussed the difference with  carotid endarterectomy and carotid artery stenting.  We discussed the rationale for treatment.  He is on appropriate medical therapy.  His he seems to be acceptable for either option.  After discussions with the patient, carotid  stenting was selected and he will be scheduled for that near future at his convenience.    Leotis Pain, MD  02/27/2022 5:29 PM    This note was created with Dragon medical transcription system.  Any errors from dictation are purely unintentional

## 2022-02-27 NOTE — H&P (View-Only) (Signed)
MRN : [id number]  Samuel [name] is a 72 y.o. (01-08-1951) male who presents with chief complaint of  Chief Complaint  Patient presents with   Follow-up    Review CT  .  History of Present Illness: Patient returns today in follow up of his carotid disease.  He has continued his cardiac rehab and is doing well.  He is scheduled to see his cardiologist in about 2 weeks to be fully cleared after his coronary artery bypass grafting about 3 months ago.  He was found to have significant carotid disease by duplex.  Since his last visit, he has undergone a CT angiogram which I have independently reviewed.  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  The official report is of less than 50% left carotid artery stenosis although would estimate this more in the 50 to 60% range.  He has not had any recent focal neurologic symptoms but does have a previous history of stroke.  He remains on aspirin, Plavix, and high-dose Lipitor.  Current Outpatient Medications  Medication Sig Dispense Refill   acetaminophen (TYLENOL) 500 MG tablet Take 1-2 tablets (500-1,000 mg total) by mouth every 6 (six) hours as needed. 30 tablet 0   amLODipine (NORVASC) 5 MG tablet Take 5 mg by mouth daily.     aspirin 81 MG EC tablet Take 81 mg by mouth daily.      atorvastatin (LIPITOR) 80 MG tablet Take 1 tablet (80 mg total) by mouth daily. 30 tablet 0   clopidogrel (PLAVIX) 75 MG tablet Take 1 tablet (75 mg total) by mouth daily. 30 tablet 11   lisinopril-hydrochlorothiazide (ZESTORETIC) 20-25 MG tablet Take 1 tablet by mouth daily.     loratadine (CLARITIN) 10 MG tablet Take 10 mg by mouth daily.     metFORMIN (GLUCOPHAGE) 500 MG tablet Take 1 tablet (500 mg total) by mouth 2 (two) times daily with a meal.     metoprolol tartrate (LOPRESSOR) 25 MG tablet Take 0.5 tablets (12.5 mg total) by mouth 2 (two) times daily. 60 tablet 3   nitroGLYCERIN (NITROSTAT) 0.4 MG  SL tablet Place under the tongue.     omeprazole (PRILOSEC) 20 MG capsule Take 20 mg by mouth daily before breakfast.     ONETOUCH ULTRA test strip CHECK BLOOD SUGAR TWICE DAILY.     oxybutynin (DITROPAN-XL) 5 MG 24 hr tablet Take 5 mg by mouth daily.     pravastatin (PRAVACHOL) 40 MG tablet Take 40 mg by mouth daily.     No current facility-administered medications for this visit.    Past Medical History:  Diagnosis Date   Anxiety    situational   Arthritis    Cancer ([place])    squamous cell carcinoma   ED (erectile dysfunction)    GERD (gastroesophageal reflux disease)    Hyperlipidemia    Hypertension    OSA (obstructive sleep apnea)    CPAP   Stroke ([place])    no residual    Uncontrolled type 2 diabetes mellitus with hyperglycemia, with long-term current use of insulin (HCC)     Past Surgical History:  Procedure Laterality Date   CARDIAC CATHETERIZATION     COLONOSCOPY     CORONARY ARTERY BYPASS GRAFT N/A 11/24/2021   Procedure: CORONARY ARTERY BYPASS GRAFTING (CABG) X3 BYPASSES, USING OPEN LEFT INTERNAL MAMMARY ARTERY AND ENDOSCOPIC RIGHT GREATER SAPHENOUS VEIN HARVEST;  Surgeon: Coralie Common, MD;  Location: [place];  Service: Open Heart Surgery;  Laterality: N/A;   KNEE ARTHROPLASTY Left 03/20/2019   Procedure: LEFT COMPUTER ASSISTED TOTAL KNEE ARTHROPLASTY;  Surgeon: Dereck Leep, MD;  Location: [facility];  Service: Orthopedics;  Laterality: Left;   KNEE ARTHROPLASTY Right 11/09/2019   Procedure: COMPUTER ASSISTED TOTAL KNEE ARTHROPLASTY;  Surgeon: Dereck Leep, MD;  Location: [facility];  Service: Orthopedics;  Laterality: Right;   KNEE ARTHROSCOPY Left    LEFT HEART CATH N/A 11/21/2021   Procedure: Left Heart Cath;  Surgeon: Isaias Cowman, MD;  Location: [facility];  Service: Cardiovascular;  Laterality: N/A;   SHOULDER ARTHROSCOPY Right 2009   rotator cuff   TEE WITHOUT CARDIOVERSION N/A 11/24/2021   Procedure: TRANSESOPHAGEAL ECHOCARDIOGRAM (TEE);   Surgeon: Coralie Common, MD;  Location: [place];  Service: Open Heart Surgery;  Laterality: N/A;   TONSILLECTOMY       Social History   Tobacco Use   Smoking status: Never   Smokeless tobacco: Never  Vaping Use   Vaping Use: Never used  Substance Use Topics   Alcohol use: Yes    Alcohol/week: 2.0 standard drinks of alcohol    Types: 2 Standard drinks or equivalent per week   Drug use: Never     Family History  Problem Relation Age of Onset   CAD Sister    CAD Brother   Father had AAA  No Known Allergies   REVIEW OF SYSTEMS (Negative unless checked)  Constitutional: '[]'$ Weight loss  '[]'$ Fever  '[]'$ Chills Cardiac: '[]'$ Chest pain   '[]'$ Chest pressure   '[]'$ Palpitations   '[]'$ Shortness of breath when laying flat   '[]'$ Shortness of breath at rest   '[x]'$ Shortness of breath with exertion. Vascular:  '[]'$ Pain in legs with walking   '[]'$ Pain in legs at rest   '[]'$ Pain in legs when laying flat   '[]'$ Claudication   '[]'$ Pain in feet when walking  '[]'$ Pain in feet at rest  '[]'$ Pain in feet when laying flat   '[]'$ History of DVT   '[]'$ Phlebitis   '[]'$ Swelling in legs   '[]'$ Varicose veins   '[]'$ Non-healing ulcers Pulmonary:   '[]'$ Uses home oxygen   '[]'$ Productive cough   '[]'$ Hemoptysis   '[]'$ Wheeze  '[]'$ COPD   '[]'$ Asthma Neurologic:  '[]'$ Dizziness  '[]'$ Blackouts   '[]'$ Seizures   '[x]'$ History of stroke   '[]'$ History of TIA  '[]'$ Aphasia   '[]'$ Temporary blindness   '[]'$ Dysphagia   '[]'$ Weakness or numbness in arms   '[]'$ Weakness or numbness in legs Musculoskeletal:  '[x]'$ Arthritis   '[]'$ Joint swelling   '[x]'$ Joint pain   '[]'$ Low back pain Hematologic:  '[]'$ Easy bruising  '[]'$ Easy bleeding   '[]'$ Hypercoagulable state   '[]'$ Anemic   Gastrointestinal:  '[]'$ Blood in stool   '[]'$ Vomiting blood  '[]'$ Gastroesophageal reflux/heartburn   '[]'$ Abdominal pain Genitourinary:  '[]'$ Chronic kidney disease   '[]'$ Difficult urination  '[]'$ Frequent urination  '[]'$ Burning with urination   '[]'$ Hematuria Skin:  '[]'$ Rashes   '[]'$ Ulcers   '[]'$ Wounds Psychological:  '[]'$ History of anxiety   '[]'$  History of major depression.  Physical  Examination  BP 130/82 (BP Location: Right Arm)   Resp 16   Ht '5\' 7"'$  (1.702 m)   Wt 211 lb (95.7 kg)   BMI 33.05 kg/m  Gen:  WD/WN, NAD Head: [place]/AT, No temporalis wasting. Ear/Nose/Throat: Hearing grossly intact, nares w/o erythema or drainage Eyes: Conjunctiva clear. Sclera non-icteric Neck: Supple.  Trachea midline Pulmonary:  Good air movement, no use of accessory muscles.  Cardiac: RRR, no JVD Vascular:  Vessel Right Left  Radial Palpable Palpable  Musculoskeletal: M/S 5/5 throughout.  No deformity or atrophy. Trace LE edema. Neurologic: Sensation grossly intact in extremities.  Symmetrical.  Speech is fluent.  Psychiatric: Judgment intact, Mood & affect appropriate for pt's clinical situation. Dermatologic: No rashes or ulcers noted.  No cellulitis or open wounds.      Labs Recent Results (from the past 2160 hour(s))  Glucose, capillary     Status: Abnormal   Collection Time: 12/25/21 10:59 AM  Result Value Ref Range   Glucose-Capillary 140 (H) 70 - 99 mg/dL    Comment: Glucose reference range applies only to samples taken after fasting for at least 8 hours.  Glucose, capillary     Status: Abnormal   Collection Time: 12/27/21 10:24 AM  Result Value Ref Range   Glucose-Capillary 136 (H) 70 - 99 mg/dL    Comment: Glucose reference range applies only to samples taken after fasting for at least 8 hours.  Glucose, capillary     Status: None   Collection Time: 12/27/21 11:57 AM  Result Value Ref Range   Glucose-Capillary 92 70 - 99 mg/dL    Comment: Glucose reference range applies only to samples taken after fasting for at least 8 hours.  Glucose, capillary     Status: Abnormal   Collection Time: 12/29/21 10:57 AM  Result Value Ref Range   Glucose-Capillary 184 (H) 70 - 99 mg/dL    Comment: Glucose reference range applies only to samples taken after fasting for at least 8 hours.  Glucose, capillary     Status: Abnormal   Collection Time: 12/29/21 12:02 PM   Result Value Ref Range   Glucose-Capillary 141 (H) 70 - 99 mg/dL    Comment: Glucose reference range applies only to samples taken after fasting for at least 8 hours.  I-STAT creatinine     Status: None   Collection Time: 02/22/22  9:28 AM  Result Value Ref Range   Creatinine, Ser 1.10 0.61 - 1.24 mg/dL    Radiology CT Angio Neck W/Cm &/Or Wo/Cm  Result Date: 02/24/2022 CLINICAL DATA:  72 year old male with abnormal carotid Doppler ultrasound in September. Currently asymptomatic. EXAM: CT ANGIOGRAPHY NECK TECHNIQUE: Multidetector CT imaging of the neck was performed using the standard protocol during bolus administration of intravenous contrast. Multiplanar CT image reconstructions and MIPs were obtained to evaluate the vascular anatomy. Carotid stenosis measurements (when applicable) are obtained utilizing NASCET criteria, using the distal internal carotid diameter as the denominator. RADIATION DOSE REDUCTION: This exam was performed according to the departmental dose-optimization program which includes automated exposure control, adjustment of the mA and/or kV according to patient size and/or use of iterative reconstruction technique. CONTRAST:  10m OMNIPAQUE IOHEXOL 350 MG/ML SOLN COMPARISON:  CTA chest 11/20/2021. FINDINGS: Skeleton: Partially visible sternotomy. Cervical spine degeneration with C2-C3 ankylosis. Mild Diffuse idiopathic skeletal hyperostosis (DISH). Left TMJ degeneration. Stable visible upper thoracic spine palm including diffuse idiopathic skeletal hyperostosis (DISH) with flowing endplate osteophytes and ankylosis. Mild chronic T3 superior endplate compression, and more moderate chronic T4 vertebral body compression with underlying lucent lesion that most resembles benign hemangioma or less likely Paget's disease. No acute osseous abnormality identified. Upper chest: Negative visible superior mediastinum. Visible upper lungs are stable. Other neck: No acute neck soft tissue  finding. Grossly negative visible brain parenchyma, orbits. Aortic arch: Prior CABG. Calcified aortic atherosclerosis. Three vessel arch configuration. Tortuous proximal great vessels. Right carotid system: Tortuous brachiocephalic artery with a kinked appearance in the superior mediastinum on series 8, image 137. But mild  brachiocephalic artery plaque. Negative right CCA origin. No significant right carotid plaque before the bifurcation. However, at the right ICA origin and bulb there is bulky combined soft and calcified plaque or mural thrombus. Subsequent right ICA origin stenosis with radiographic string sign (series 5, image 51 and series 10, image 85) over a 2-3 mm segment. Additional calcified plaque at the right ICA bulb and up to 50% distal bulb stenosis. But the vessel remains patent to the skull base. Visible right ICA siphon is patent. Left carotid system: Mild left CCA origin plaque without stenosis. Tortuous proximal left CCA. Calcified plaque at the left ICA origin and bulb. Superimposed bulky soft plaque also at the distal bulb (series 10, image 151). But less than 50 % stenosis with respect to the distal vessel. Left ICA remains patent to the skull base with negative visible left ICA siphon. Vertebral arteries: Mostly calcified right subclavian artery origin plaque without significant stenosis. Calcified right vertebral artery origin and proximal V1 segment with moderate stenosis on series 6, image 170. Tortuous right vertebral distal to the stenosis. Right vertebral remains patent to the vertebrobasilar junction with mild V4 calcified plaque. Right AICA appears to be dominant. Proximal left subclavian artery calcified plaque without significant stenosis. Normal left vertebral artery origin. Tortuous left V1 segment. Fairly codominant left vertebral artery is tortuous in the neck and patent to the skull base without stenosis. But there is left V4 bulky calcified plaque in the posterior fossa with  moderate to severe stenosis on series 8, image 100. An additional tandem high-grade stenosis just proximal to the vertebrobasilar junction (series 6, image 14). Left PICA is diminutive or absent. Review of the MIP images confirms the above findings IMPRESSION: 1. Positive for bulky soft complex plaque and/or mural thrombus at the Right ICA origin with a short segment RADIOGRAPHIC STRING SIGN stenosis. And up to 50% tandem stenosis at the Right bulb. 2. Combined soft and calcified plaque at the Left ICA origin and bulb, but no significant stenosis. 3. Bulky atherosclerosis of the Distal Left Vertebral Artery with tandem moderate to severe V4 stenoses. 4. Moderate Right Vertebral Artery V1 segment stenosis. 5.  Aortic Atherosclerosis (ICD10-I70.0).  Prior CABG. Electronically Signed   By: Genevie Ann M.D.   On: 02/24/2022 13:20    Assessment/Plan Essential hypertension blood pressure control important in reducing the progression of atherosclerotic disease. On appropriate oral medications.     Diabetes mellitus with stage 2 chronic kidney disease (HCC) blood glucose control important in reducing the progression of atherosclerotic disease. Also, involved in wound healing. On appropriate medications.     Dyslipidemia lipid control important in reducing the progression of atherosclerotic disease. Continue statin therapy     Pain in limb His ABIs showed no significant lower extremity arterial insufficiency.   Carotid artery stenosis, asymptomatic, bilateral he has undergone a CT angiogram which I have independently reviewed.  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  The official report is of less than 50% left carotid artery stenosis although would estimate this more in the 50 to 60% range.  At this point, he clearly should have right carotid intervention to reduce his risk of stroke going forward.  We discussed the difference with  carotid endarterectomy and carotid artery stenting.  We discussed the rationale for treatment.  He is on appropriate medical therapy.  His he seems to be acceptable for either option.  After discussions with the patient, carotid  stenting was selected and he will be scheduled for that near future at his convenience.    Leotis Pain, MD  02/27/2022 5:29 PM    This note was created with Dragon medical transcription system.  Any errors from dictation are purely unintentional

## 2022-02-28 ENCOUNTER — Encounter: Payer: Medicare Other | Attending: Cardiology | Admitting: *Deleted

## 2022-02-28 VITALS — Ht 67.5 in | Wt 208.7 lb

## 2022-02-28 DIAGNOSIS — Z951 Presence of aortocoronary bypass graft: Secondary | ICD-10-CM | POA: Diagnosis not present

## 2022-02-28 DIAGNOSIS — I214 Non-ST elevation (NSTEMI) myocardial infarction: Secondary | ICD-10-CM | POA: Insufficient documentation

## 2022-02-28 NOTE — Progress Notes (Signed)
Daily Session Note  Patient Details  Name: Samuel [name] MRN: [id number] Date of Birth: 29-Jan-1951 Referring Provider:   Flowsheet Row Cardiac Rehab from 12/21/2021 in [facility]  Referring Provider Isaias Cowman, MD       Encounter Date: 02/28/2022  Check In:  Session Check In - 02/28/22 1121       Check-In   Supervising physician immediately available to respond to emergencies See telemetry face sheet for immediately available ER MD    Location [facility]-Cardiac & Pulmonary Rehab    Staff Present Darlyne Russian, RN, ADN;Meredith Sherryll Burger, RN BSN;Noah Tickle, BS, Exercise Physiologist;Joseph Tessie Fass, [place]    Virtual Visit No    Medication changes reported     No    Fall or balance concerns reported    No    Warm-up and Cool-down Performed on first and last piece of equipment    Resistance Training Performed Yes    VAD Patient? No    PAD/SET Patient? No      Pain Assessment   Currently in Pain? No/denies                Social History   Tobacco Use  Smoking Status Never  Smokeless Tobacco Never    Goals Met:  Independence with exercise equipment Exercise tolerated well No report of concerns or symptoms today Strength training completed today  Goals Unmet:  Not Applicable  Comments: Pt able to follow exercise prescription today without complaint.  Will continue to monitor for progression.    Dr. Emily Filbert is Medical Director for [place].  Dr. Ottie Glazier is Medical Director for [facility] Pulmonary Rehabilitation.

## 2022-02-28 NOTE — Patient Instructions (Signed)
Discharge Patient Instructions  Patient Details  Name: Samuel [name] MRN: [id number] Date of Birth: 1950-03-08 Referring Provider:  Kirk Ruths, MD  Number of Visits: 36  Reason for Discharge:  Patient reached a stable level of exercise. Patient independent in their exercise. Patient has met program and personal goals.  Diagnosis:  NSTEMI (non-ST elevation myocardial infarction) ([place])  S/P CABG x 3  Initial Exercise Prescription:  Initial Exercise Prescription - 12/21/21 1300       Date of Initial Exercise RX and Referring Provider   Date 12/21/21    Referring Provider Samuel Cowman, MD      Oxygen   Maintain Oxygen Saturation 88% or higher      Treadmill   MPH 2    Grade 0.5    Minutes 15    METs 2.67      NuStep   Level 2    SPM 80    Minutes 15    METs 2.49      REL-XR   Level 1    Speed 50    Minutes 15    METs 2.49      Prescription Details   Frequency (times per week) 3    Duration Progress to 30 minutes of continuous aerobic without signs/symptoms of physical distress      Intensity   THRR 40-80% of Max Heartrate 98-131    Ratings of Perceived Exertion 11-13    Perceived Dyspnea 0-4      Progression   Progression Continue to progress workloads to maintain intensity without signs/symptoms of physical distress.      Resistance Training   Training Prescription Yes    Weight 3 lb    Reps 10-15             Discharge Exercise Prescription (Final Exercise Prescription Changes):  Exercise Prescription Changes - 02/14/22 1100       Response to Exercise   Blood Pressure (Admit) 128/74    Blood Pressure (Exit) 118/62    Heart Rate (Admit) 78 bpm    Heart Rate (Exercise) 115 bpm    Heart Rate (Exit) 87 bpm    Rating of Perceived Exertion (Exercise) 15    Symptoms none    Duration Continue with 30 min of aerobic exercise without signs/symptoms of physical distress.    Intensity THRR unchanged      Progression   Progression  Continue to progress workloads to maintain intensity without signs/symptoms of physical distress.    Average METs 3.75      Resistance Training   Training Prescription Yes    Weight 5 lb    Reps 10-15      Interval Training   Interval Training No      Treadmill   MPH 2.8    Grade 2    Minutes 15    METs 3.92      REL-XR   Level 7    Minutes 15      Home Exercise Plan   Plans to continue exercise at Home (comment)   walking   Frequency Add 2 additional days to program exercise sessions.    Initial Home Exercises Provided 01/05/22      Oxygen   Maintain Oxygen Saturation 88% or higher             Functional Capacity:  6 Minute Walk     Row Name 12/21/21 1252 02/28/22 1121       6 Minute Walk   Phase Initial  Discharge    Distance 1235 feet 1535 feet    Distance % Change -- 24.3 %    Distance Feet Change -- 300 ft    Walk Time 6 minutes 6 minutes    # of Rest Breaks 0 0    MPH 2.34 2.91    METS 2.49 3.39    RPE 11 11    Perceived Dyspnea  2 0    VO2 Peak 8.71 11.87    Symptoms Yes (comment) No    Comments SOB --    Resting HR 64 bpm 79 bpm    Resting BP 114/66 138/72    Resting Oxygen Saturation  98 % 95 %    Exercise Oxygen Saturation  during 6 min walk 97 % 94 %    Max Ex. HR 104 bpm 115 bpm    Max Ex. BP 130/80 168/74    2 Minute Post BP 122/76 --             Nutrition & Weight - Outcomes:  Pre Biometrics - 12/21/21 1306       Pre Biometrics   Height 5' 7.5" (1.715 m)    Weight 203 lb 1.6 oz (92.1 kg)    Waist Circumference 43 inches    Hip Circumference 42.5 inches    Waist to Hip Ratio 1.01 %    BMI (Calculated) 31.32    Single Leg Stand 7.5 seconds   R            Post Biometrics - 02/28/22 1124        Post  Biometrics   Height 5' 7.5" (1.715 m)    Weight 208 lb 11.2 oz (94.7 kg)    BMI (Calculated) 32.19    Single Leg Stand 13.3 seconds   R            Nutrition:  Nutrition Therapy & Goals - 12/21/21 0914        Nutrition Therapy   Diet Heart healthy, low Na, T2DM MNT    Drug/Food Interactions Statins/Certain Fruits    Protein (specify units) 85-95g    Fiber 31 grams    Whole Grain Foods 3 servings    Saturated Fats 17 max. grams    Fruits and Vegetables 8 servings/day    Sodium 2 grams      Personal Nutrition Goals   Nutrition Goal ST: practice adding volume, fiber, protein, and healthy fats to meals with MyPlate as well as review paperwork LT: achieve and maintain A1C <7, at least 31g of fiber/day, <17g saturated fat/day, follow MyPlate guidelines    Comments 72 y.o. M admitted to cardiac rehab s/p NSTEMI and CABG x3. PMHx includes HTN, HLD, T2DM, stg 3a CKD, OSA, RBBB, GERD. PSHx Left and right TKA 2021. Relevant medications includes lipitor, furosemide, metformin, omeprazole. [name] and his wife Samuel [name] reports making changes recently to both of their diets after his diabetes diagnosis and after speaking with the doctor about his heart health. Food recall: Coffee (zero sugar creamer) B: 1 glass of 1% milk or tomato juice with breakfast. 1 egg from 2 eggs with 1.5 pieces of sausage or bacon and grits (no longer putting sugar in it) or cheerios (honey nut) or oatmeal with some butter and honey L: sandwich (whole wheat) with ham and cheese with olive oil and vinegar dressing and mustard or light mayo D: vaires: wife will cook dinner. Lean ground [place] with spaghetti (they have switched to ground [place] and chicken mostly for protein -  grilling or baking normally). he likes pinto beans, peas, carrots, turnip greens, bell peppers, and onions. Drinks: lemonade (crystal lite), sometimes small coke zero. Occassionally will have a beer or 2. Discussed heart healthy eating and MyPlate guidelines as well as considerations for BG management and T2DM. his wife is interested in mixing cauliflower rice with regular rice and trying lentils.      Intervention Plan   Intervention Prescribe, educate and counsel regarding  individualized specific dietary modifications aiming towards targeted core components such as weight, hypertension, lipid management, diabetes, heart failure and other comorbidities.;Nutrition handout(s) given to patient.    Expected Outcomes Short Term Goal: Understand basic principles of dietary content, such as calories, fat, sodium, cholesterol and nutrients.;Short Term Goal: A plan has been developed with personal nutrition goals set during dietitian appointment.;Long Term Goal: Adherence to prescribed nutrition plan.

## 2022-03-05 ENCOUNTER — Encounter: Payer: Medicare Other | Admitting: *Deleted

## 2022-03-05 DIAGNOSIS — I214 Non-ST elevation (NSTEMI) myocardial infarction: Secondary | ICD-10-CM | POA: Diagnosis not present

## 2022-03-05 DIAGNOSIS — Z951 Presence of aortocoronary bypass graft: Secondary | ICD-10-CM

## 2022-03-05 NOTE — Progress Notes (Signed)
Daily Session Note  Patient Details  Name: Samuel [name] MRN: [id number] Date of Birth: May 17, 1950 Referring Provider:   Flowsheet Row Cardiac Rehab from 12/21/2021 in [facility] Cardiac and Pulmonary Rehab  Referring Provider Isaias Cowman, MD       Encounter Date: 03/05/2022  Check In:  Session Check In - 03/05/22 1127       Check-In   Supervising physician immediately available to respond to emergencies See telemetry face sheet for immediately available ER MD    Location [facility]-Cardiac & Pulmonary Rehab    Staff Present Darlyne Russian, RN, Doyce Para, BS, ACSM CEP, Exercise Physiologist;Meredith Sherryll Burger, RN BSN;Noah Tickle, BS, Exercise Physiologist    Virtual Visit No    Medication changes reported     No    Fall or balance concerns reported    No    Warm-up and Cool-down Performed on first and last piece of equipment    Resistance Training Performed Yes    VAD Patient? No    PAD/SET Patient? No      Pain Assessment   Currently in Pain? No/denies                Social History   Tobacco Use  Smoking Status Never  Smokeless Tobacco Never    Goals Met:  Independence with exercise equipment Exercise tolerated well No report of concerns or symptoms today Strength training completed today  Goals Unmet:  Not Applicable  Comments: Pt able to follow exercise prescription today without complaint.  Will continue to monitor for progression.    Dr. Emily Filbert is Medical Director for [place].  Dr. Ottie Glazier is Medical Director for [facility] Pulmonary Rehabilitation.

## 2022-03-06 ENCOUNTER — Encounter: Payer: Medicare Other | Admitting: *Deleted

## 2022-03-06 DIAGNOSIS — Z951 Presence of aortocoronary bypass graft: Secondary | ICD-10-CM

## 2022-03-06 DIAGNOSIS — I214 Non-ST elevation (NSTEMI) myocardial infarction: Secondary | ICD-10-CM

## 2022-03-06 NOTE — Progress Notes (Signed)
Daily Session Note  Patient Details  Name: Samuel [name] MRN: [id number] Date of Birth: 30-Sep-1950 Referring Provider:   Flowsheet Row Cardiac Rehab from 12/21/2021 in [facility] Cardiac and Pulmonary Rehab  Referring Provider Isaias Cowman, MD       Encounter Date: 03/06/2022  Check In:  Session Check In - 03/06/22 1151       Check-In   Supervising physician immediately available to respond to emergencies See telemetry face sheet for immediately available ER MD    Location [facility]-Cardiac & Pulmonary Rehab    Staff Present Heath Lark, RN, BSN, CCRP;Jessica Gratis, MA, RCEP, CCRP, Bertram Gala, MS, ACSM CEP, Exercise Physiologist;Noah Tickle, BS, Exercise Physiologist    Virtual Visit No    Medication changes reported     No    Fall or balance concerns reported    No    Warm-up and Cool-down Performed on first and last piece of equipment    Resistance Training Performed Yes    VAD Patient? No    PAD/SET Patient? No      Pain Assessment   Currently in Pain? No/denies                Social History   Tobacco Use  Smoking Status Never  Smokeless Tobacco Never    Goals Met:  Independence with exercise equipment Exercise tolerated well No report of concerns or symptoms today  Goals Unmet:  Not Applicable  Comments: Pt able to follow exercise prescription today without complaint.  Will continue to monitor for progression.    Dr. Emily Filbert is Medical Director for [place].  Dr. Ottie Glazier is Medical Director for [facility] Pulmonary Rehabilitation.

## 2022-03-07 ENCOUNTER — Encounter: Payer: Medicare Other | Admitting: *Deleted

## 2022-03-07 ENCOUNTER — Telehealth (INDEPENDENT_AMBULATORY_CARE_PROVIDER_SITE_OTHER): Payer: Self-pay

## 2022-03-07 DIAGNOSIS — I214 Non-ST elevation (NSTEMI) myocardial infarction: Secondary | ICD-10-CM

## 2022-03-07 DIAGNOSIS — Z951 Presence of aortocoronary bypass graft: Secondary | ICD-10-CM

## 2022-03-07 NOTE — Progress Notes (Signed)
Daily Session Note  Patient Details  Name: Samuel [name] MRN: [id number] Date of Birth: 1950/08/16 Referring Provider:   Flowsheet Row Cardiac Rehab from 12/21/2021 in [facility] Cardiac and Pulmonary Rehab  Referring Provider Isaias Cowman, MD       Encounter Date: 03/07/2022  Check In:  Session Check In - 03/07/22 1124       Check-In   Supervising physician immediately available to respond to emergencies See telemetry face sheet for immediately available ER MD    Location [facility]-Cardiac & Pulmonary Rehab    Staff Present Darlyne Russian, RN, ADN;Meredith Sherryll Burger, RN BSN;Joseph Hood, RCP,RRT,BSRT;Noah Tickle, BS, Exercise Physiologist    Virtual Visit No    Medication changes reported     No    Fall or balance concerns reported    No    Warm-up and Cool-down Performed on first and last piece of equipment    Resistance Training Performed Yes    VAD Patient? No    PAD/SET Patient? No      Pain Assessment   Currently in Pain? No/denies                Social History   Tobacco Use  Smoking Status Never  Smokeless Tobacco Never    Goals Met:  Independence with exercise equipment Exercise tolerated well No report of concerns or symptoms today Strength training completed today  Goals Unmet:  Not Applicable  Comments:  [place] graduated today from  rehab with 36 sessions completed.  Details of the patient's exercise prescription and what He needs to do in order to continue the prescription and progress were discussed with patient.  Patient was given a copy of prescription and goals.  Patient verbalized understanding.  Gracen plans to continue to exercise by walking.    Dr. Emily Filbert is Medical Director for [place].  Dr. Ottie Glazier is Medical Director for [facility] Pulmonary Rehabilitation.

## 2022-03-07 NOTE — Progress Notes (Signed)
Cardiac Individual Treatment Plan  Patient Details  Name: Samuel [name] MRN: [id number] Date of Birth: 09/28/1950 Referring Provider:   Flowsheet Row Cardiac Rehab from 12/21/2021 in [facility] Cardiac and Pulmonary Rehab  Referring Provider Isaias Cowman, MD       Initial Encounter Date:  Flowsheet Row Cardiac Rehab from 12/21/2021 in [facility] Cardiac and Pulmonary Rehab  Date 12/21/21       Visit Diagnosis: NSTEMI (non-ST elevation myocardial infarction) ([place])  S/P CABG x 3  Patient's Home Medications on Admission:  Current Outpatient Medications:    acetaminophen (TYLENOL) 500 MG tablet, Take 1-2 tablets (500-1,000 mg total) by mouth every 6 (six) hours as needed., Disp: 30 tablet, Rfl: 0   amLODipine (NORVASC) 5 MG tablet, Take 5 mg by mouth daily., Disp: , Rfl:    aspirin 81 MG EC tablet, Take 81 mg by mouth daily. , Disp: , Rfl:    atorvastatin (LIPITOR) 80 MG tablet, Take 1 tablet (80 mg total) by mouth daily., Disp: 30 tablet, Rfl: 0   clopidogrel (PLAVIX) 75 MG tablet, Take 1 tablet (75 mg total) by mouth daily., Disp: 30 tablet, Rfl: 11   lisinopril-hydrochlorothiazide (ZESTORETIC) 20-25 MG tablet, Take 1 tablet by mouth daily., Disp: , Rfl:    loratadine (CLARITIN) 10 MG tablet, Take 10 mg by mouth daily., Disp: , Rfl:    metFORMIN (GLUCOPHAGE) 500 MG tablet, Take 1 tablet (500 mg total) by mouth 2 (two) times daily with a meal., Disp: , Rfl:    metoprolol tartrate (LOPRESSOR) 25 MG tablet, Take 0.5 tablets (12.5 mg total) by mouth 2 (two) times daily., Disp: 60 tablet, Rfl: 3   nitroGLYCERIN (NITROSTAT) 0.4 MG SL tablet, Place under the tongue., Disp: , Rfl:    omeprazole (PRILOSEC) 20 MG capsule, Take 20 mg by mouth daily before breakfast., Disp: , Rfl:    ONETOUCH ULTRA test strip, CHECK BLOOD SUGAR TWICE DAILY., Disp: , Rfl:    oxybutynin (DITROPAN-XL) 5 MG 24 hr tablet, Take 5 mg by mouth daily., Disp: , Rfl:    pravastatin (PRAVACHOL) 40 MG tablet, Take 40 mg by  mouth daily., Disp: , Rfl:   Past Medical History: Past Medical History:  Diagnosis Date   Anxiety    situational   Arthritis    Cancer ([place])    squamous cell carcinoma   ED (erectile dysfunction)    GERD (gastroesophageal reflux disease)    Hyperlipidemia    Hypertension    OSA (obstructive sleep apnea)    CPAP   Stroke ([place])    no residual    Uncontrolled type 2 diabetes mellitus with hyperglycemia, with long-term current use of insulin (HCC)     Tobacco Use: Social History   Tobacco Use  Smoking Status Never  Smokeless Tobacco Never    Labs: Review Flowsheet  More data may exist      Latest Ref Rng & Units 03/18/2019 11/03/2019 11/21/2021 11/23/2021 11/24/2021  Labs for ITP Cardiac and Pulmonary Rehab  Cholestrol 0 - 200 mg/dL - - 174  - -  LDL (calc) 0 - 99 mg/dL - - 108  - -  HDL-C >40 mg/dL - - 27  - -  Trlycerides <150 mg/dL - - 197  - -  Hemoglobin A1c 4.8 - 5.6 % 6.9  6.6  7.3  - -  PH, Arterial 7.35 - 7.45 - - - 7.46  7.340  7.375  7.306  7.276  7.317  7.383  7.334  7.324  PCO2 arterial 32 - 48 mmHg - - - 32  43.0  35.1  43.0  48.3  47.5  39.7  49.0  41.7   Bicarbonate 20.0 - 28.0 mmol/L - - - 22.8  23.3  20.7  21.7  22.7  24.3  23.6  24.9  26.1  21.7   TCO2 22 - 32 mmol/L - - - - '25  22  23  24  24  26  24  25  24  26  28  23  26  23   '$ Acid-base deficit 0.0 - 2.0 mmol/L - - - 0.3  3.0  4.0  5.0  4.0  2.0  1.0  1.0  4.0   O2 Saturation % - - - 98.1  87  98  99  98  100  100  79  100  99      Exercise Target Goals: Exercise Program Goal: Individual exercise prescription set using results from initial 6 min walk test and THRR while considering  patient's activity barriers and safety.   Exercise Prescription Goal: Initial exercise prescription builds to 30-45 minutes a day of aerobic activity, 2-3 days per week.  Home exercise guidelines will be given to patient during program as part of exercise prescription that the participant will  acknowledge.   Education: Aerobic Exercise: - Group verbal and visual presentation on the components of exercise prescription. Introduces F.I.T.T principle from ACSM for exercise prescriptions.  Reviews F.I.T.T. principles of aerobic exercise including progression. Written material given at graduation. Flowsheet Row Cardiac Rehab from 02/28/2022 in [facility] Cardiac and Pulmonary Rehab  Date 01/03/22  Educator [facility]  Instruction Review Code 1- Verbalizes Understanding       Education: Resistance Exercise: - Group verbal and visual presentation on the components of exercise prescription. Introduces F.I.T.T principle from ACSM for exercise prescriptions  Reviews F.I.T.T. principles of resistance exercise including progression. Written material given at graduation. Flowsheet Row Cardiac Rehab from 02/28/2022 in [facility] Cardiac and Pulmonary Rehab  Date 01/17/22  Educator NT  Instruction Review Code 1- Verbalizes Understanding        Education: Exercise & Equipment Safety: - Individual verbal instruction and demonstration of equipment use and safety with use of the equipment.   Education: Exercise Physiology & General Exercise Guidelines: - Group verbal and written instruction with models to review the exercise physiology of the cardiovascular system and associated critical values. Provides general exercise guidelines with specific guidelines to those with heart or lung disease.  Flowsheet Row Cardiac Rehab from 02/28/2022 in [facility] Cardiac and Pulmonary Rehab  Date 12/27/21  Educator [facility]  Instruction Review Code 1- Verbalizes Understanding       Education: Flexibility, Balance, Mind/Body Relaxation: - Group verbal and visual presentation with interactive activity on the components of exercise prescription. Introduces F.I.T.T principle from ACSM for exercise prescriptions. Reviews F.I.T.T. principles of flexibility and balance exercise training including progression. Also discusses the mind body  connection.  Reviews various relaxation techniques to help reduce and manage stress (i.e. Deep breathing, progressive muscle relaxation, and visualization). Balance handout provided to take home. Written material given at graduation. Flowsheet Row Cardiac Rehab from 02/28/2022 in [facility] Cardiac and Pulmonary Rehab  Date 01/17/22  Educator NT  Instruction Review Code 1- Verbalizes Understanding       Activity Barriers & Risk Stratification:  Activity Barriers & Cardiac Risk Stratification - 12/21/21 1253       Activity Barriers & Cardiac Risk Stratification   Activity Barriers Left Knee  Replacement;Right Knee Replacement;Arthritis   arthritis in both arms limits ROM   Cardiac Risk Stratification High             6 Minute Walk:  6 Minute Walk     Row Name 12/21/21 1252 02/28/22 1121       6 Minute Walk   Phase Initial Discharge    Distance 1235 feet 1535 feet    Distance % Change -- 24.3 %    Distance Feet Change -- 300 ft    Walk Time 6 minutes 6 minutes    # of Rest Breaks 0 0    MPH 2.34 2.91    METS 2.49 3.39    RPE 11 11    Perceived Dyspnea  2 0    VO2 Peak 8.71 11.87    Symptoms Yes (comment) No    Comments SOB --    Resting HR 64 bpm 79 bpm    Resting BP 114/66 138/72    Resting Oxygen Saturation  98 % 95 %    Exercise Oxygen Saturation  during 6 min walk 97 % 94 %    Max Ex. HR 104 bpm 115 bpm    Max Ex. BP 130/80 168/74    2 Minute Post BP 122/76 --             Oxygen Initial Assessment:   Oxygen Re-Evaluation:   Oxygen Discharge (Final Oxygen Re-Evaluation):   Initial Exercise Prescription:  Initial Exercise Prescription - 12/21/21 1300       Date of Initial Exercise RX and Referring Provider   Date 12/21/21    Referring Provider Isaias Cowman, MD      Oxygen   Maintain Oxygen Saturation 88% or higher      Treadmill   MPH 2    Grade 0.5    Minutes 15    METs 2.67      NuStep   Level 2    SPM 80    Minutes 15    METs  2.49      REL-XR   Level 1    Speed 50    Minutes 15    METs 2.49      Prescription Details   Frequency (times per week) 3    Duration Progress to 30 minutes of continuous aerobic without signs/symptoms of physical distress      Intensity   THRR 40-80% of Max Heartrate 98-131    Ratings of Perceived Exertion 11-13    Perceived Dyspnea 0-4      Progression   Progression Continue to progress workloads to maintain intensity without signs/symptoms of physical distress.      Resistance Training   Training Prescription Yes    Weight 3 lb    Reps 10-15             Perform Capillary Blood Glucose checks as needed.  Exercise Prescription Changes:   Exercise Prescription Changes     Row Name 12/21/21 1300 01/04/22 1400 01/05/22 1100 01/17/22 1500 02/01/22 1600     Response to Exercise   Blood Pressure (Admit) 114/66 112/66 -- 124/60 102/60   Blood Pressure (Exercise) 130/80 142/70 -- 150/64 --   Blood Pressure (Exit) 122/76 102/60 -- 126/64 102/64   Heart Rate (Admit) 64 bpm 91 bpm -- 90 bpm 74 bpm   Heart Rate (Exercise) 104 bpm 127 bpm -- 137 bpm 130 bpm   Heart Rate (Exit) 67 bpm 103 bpm -- 88 bpm 87 bpm  Oxygen Saturation (Admit) 98 % -- -- -- --   Oxygen Saturation (Exercise) 97 % -- -- -- --   Rating of Perceived Exertion (Exercise) 11 13 -- 15 15   Perceived Dyspnea (Exercise) 2 -- -- -- --   Symptoms SOB none -- none none   Comments 6MWT Results First 3 day of Rehab -- -- --   Duration -- Progress to 30 minutes of  aerobic without signs/symptoms of physical distress -- Continue with 30 min of aerobic exercise without signs/symptoms of physical distress. Continue with 30 min of aerobic exercise without signs/symptoms of physical distress.   Intensity -- THRR unchanged -- THRR unchanged THRR unchanged     Progression   Progression -- Continue to progress workloads to maintain intensity without signs/symptoms of physical distress. -- Continue to progress workloads  to maintain intensity without signs/symptoms of physical distress. Continue to progress workloads to maintain intensity without signs/symptoms of physical distress.   Average METs -- 2.71 -- 3.46 3.59     Resistance Training   Training Prescription -- Yes -- Yes Yes   Weight -- 3 lb -- 3 lb 3 lb   Reps -- 10-15 -- 10-15 10-15     Interval Training   Interval Training -- No -- No No     Treadmill   MPH -- 2.5 -- 2.8 2.8   Grade -- 1 -- 3.5 3   Minutes -- 15 -- 15 15   METs -- 3.26 -- 4.49 4.3     NuStep   Level -- 2 -- 5 5   Minutes -- 15 -- 15 15   METs -- 2.3 -- 2.7 2.4     REL-XR   Level -- 5 -- 6 7   Minutes -- 15 -- 15 15   METs -- 3.1 -- 0 --     Home Exercise Plan   Plans to continue exercise at -- -- Home (comment)  walking Home (comment)  walking Home (comment)  walking   Frequency -- -- Add 2 additional days to program exercise sessions. Add 2 additional days to program exercise sessions. Add 2 additional days to program exercise sessions.   Initial Home Exercises Provided -- -- 01/05/22 01/05/22 01/05/22     Oxygen   Maintain Oxygen Saturation -- 88% or higher -- 88% or higher 88% or higher    Row Name 02/14/22 1100 03/01/22 1000           Response to Exercise   Blood Pressure (Admit) 128/74 122/62      Blood Pressure (Exit) 118/62 102/60      Heart Rate (Admit) 78 bpm 78 bpm      Heart Rate (Exercise) 115 bpm 121 bpm      Heart Rate (Exit) 87 bpm 89 bpm      Rating of Perceived Exertion (Exercise) 15 14      Symptoms none none      Duration Continue with 30 min of aerobic exercise without signs/symptoms of physical distress. Continue with 30 min of aerobic exercise without signs/symptoms of physical distress.      Intensity THRR unchanged THRR unchanged        Progression   Progression Continue to progress workloads to maintain intensity without signs/symptoms of physical distress. Continue to progress workloads to maintain intensity without  signs/symptoms of physical distress.      Average METs 3.75 3.91        Resistance Training   Training Prescription Yes Yes  Weight 5 lb 6 lb      Reps 10-15 10-15        Interval Training   Interval Training No No        Treadmill   MPH 2.8 2.8      Grade 2 2.5      Minutes 15 15      METs 3.92 4.11        NuStep   Level -- 5      Minutes -- 15      METs -- 4.9        REL-XR   Level 7 7      Minutes 15 15        Home Exercise Plan   Plans to continue exercise at Home (comment)  walking Home (comment)  walking      Frequency Add 2 additional days to program exercise sessions. Add 2 additional days to program exercise sessions.      Initial Home Exercises Provided 01/05/22 01/05/22        Oxygen   Maintain Oxygen Saturation 88% or higher 88% or higher               Exercise Comments:   Exercise Comments     Row Name 12/25/21 1125           Exercise Comments First full day of exercise!  Patient was oriented to gym and equipment including functions, settings, policies, and procedures.  Patient's individual exercise prescription and treatment plan were reviewed.  All starting workloads were established based on the results of the 6 minute walk test done at initial orientation visit.  The plan for exercise progression was also introduced and progression will be customized based on patient's performance and goals.                Exercise Goals and Review:   Exercise Goals     Row Name 12/21/21 1305             Exercise Goals   Increase Physical Activity Yes       Intervention Provide advice, education, support and counseling about physical activity/exercise needs.;Develop an individualized exercise prescription for aerobic and resistive training based on initial evaluation findings, risk stratification, comorbidities and participant's personal goals.       Expected Outcomes Short Term: Attend rehab on a regular basis to increase amount of physical  activity.;Long Term: Exercising regularly at least 3-5 days a week.;Long Term: Add in home exercise to make exercise part of routine and to increase amount of physical activity.       Increase Strength and Stamina Yes       Intervention Provide advice, education, support and counseling about physical activity/exercise needs.;Develop an individualized exercise prescription for aerobic and resistive training based on initial evaluation findings, risk stratification, comorbidities and participant's personal goals.       Expected Outcomes Short Term: Increase workloads from initial exercise prescription for resistance, speed, and METs.;Short Term: Perform resistance training exercises routinely during rehab and add in resistance training at home;Long Term: Improve cardiorespiratory fitness, muscular endurance and strength as measured by increased METs and functional capacity (6MWT)       Able to understand and use rate of perceived exertion (RPE) scale Yes       Intervention Provide education and explanation on how to use RPE scale       Expected Outcomes Short Term: Able to use RPE daily in rehab to express subjective intensity level;Long  Term:  Able to use RPE to guide intensity level when exercising independently       Able to understand and use Dyspnea scale Yes       Intervention Provide education and explanation on how to use Dyspnea scale       Expected Outcomes Long Term: Able to use Dyspnea scale to guide intensity level when exercising independently;Short Term: Able to use Dyspnea scale daily in rehab to express subjective sense of shortness of breath during exertion       Knowledge and understanding of Target Heart Rate Range (THRR) Yes       Intervention Provide education and explanation of THRR including how the numbers were predicted and where they are located for reference       Expected Outcomes Short Term: Able to state/look up THRR;Long Term: Able to use THRR to govern intensity when  exercising independently;Short Term: Able to use daily as guideline for intensity in rehab       Able to check pulse independently Yes       Intervention Provide education and demonstration on how to check pulse in carotid and radial arteries.;Review the importance of being able to check your own pulse for safety during independent exercise       Expected Outcomes Short Term: Able to explain why pulse checking is important during independent exercise;Long Term: Able to check pulse independently and accurately       Understanding of Exercise Prescription Yes       Intervention Provide education, explanation, and written materials on patient's individual exercise prescription       Expected Outcomes Short Term: Able to explain program exercise prescription;Long Term: Able to explain home exercise prescription to exercise independently                Exercise Goals Re-Evaluation :  Exercise Goals Re-Evaluation     Row Name 12/25/21 1125 01/04/22 1437 01/05/22 1114 01/17/22 1558 01/29/22 1120     Exercise Goal Re-Evaluation   Exercise Goals Review Able to understand and use rate of perceived exertion (RPE) scale;Able to understand and use Dyspnea scale;Knowledge and understanding of Target Heart Rate Range (THRR);Understanding of Exercise Prescription Understanding of Exercise Prescription;Increase Physical Activity;Increase Strength and Stamina Understanding of Exercise Prescription;Increase Physical Activity;Able to understand and use rate of perceived exertion (RPE) scale;Knowledge and understanding of Target Heart Rate Range (THRR);Able to understand and use Dyspnea scale;Increase Strength and Stamina;Able to check pulse independently Understanding of Exercise Prescription;Increase Physical Activity;Increase Strength and Stamina Understanding of Exercise Prescription;Increase Physical Activity;Increase Strength and Stamina   Comments Reviewed RPE scale, THR and program prescription with pt  today.  Pt voiced understanding and was given a copy of goals to take home. Samuel [name] is off to a good start in rehab. He worked at an average MET level of 2.71 METs during his first three sessions. He also was able to work up to level 5 on the XR. He increased his workload on the treadmill to a speed of 2.5 mph and an incline of 1% as well. We will continue to monitor his progress in the program. Reviewed home exercise with pt today.  Pt plans to walk at home for exercise.  Reviewed THR, pulse, RPE, sign and symptoms, pulse oximetery and when to call 911 or MD.  Also discussed weather considerations and indoor options.  Pt voiced understanding. Samuel [name] continues to do well in rehab. He recently increased his overall average MET level to 3.46 METs. He also increased  his speed on the treadmill to 2.8 mph and his incline to 3.5%. He has consistently hit his THR on the treadmill as well. He improved to level 6 on the XR also. We will continue to monitor his progress in the program. Samuel [name] is doing well with hisexercise at home. He is walking outdoors for about 15-20 min and then goes on his  recumbent bike for another 15 minutes.  He is doing this for 2 days outside of rehab to equal 5 total/ week. We talked about weather restrictions and to be mindful its not too cold (typically under 40 degrees) and to stay indoors if so. Encouraged to get something to start checking his HR during exercise such as a pulse ox or fitness watch. We reviewed THR.   Expected Outcomes Short: Use RPE daily to regulate intensity.  Long: Follow program prescription in THR. Short: Continue to increase workloads as tolerated. Long: Continue to increase strength and stamina. Short: Start to add in walking at home consistently Long: Continue to improve stamina Short: Continue to increase workloads as tolerated  Long: Continue to increase strength and stamina. Short: Start checking HR during exercise Long: Continue to exercise independently at home     [place] Name 02/01/22 1610 02/14/22 1138 03/01/22 1030 03/06/22 1107       Exercise Goal Re-Evaluation   Exercise Goals Review Understanding of Exercise Prescription;Increase Physical Activity;Increase Strength and Stamina Understanding of Exercise Prescription;Increase Physical Activity;Increase Strength and Stamina Understanding of Exercise Prescription;Increase Physical Activity;Increase Strength and Stamina Understanding of Exercise Prescription;Increase Physical Activity;Increase Strength and Stamina    Comments Samuel [name] continues to do well in the program. He recently increased his overall average MET level to 3.59 METs. He also has consistently hit his THR while walking on the treadmill. He improved to level 7 on the XR as well. We will continue to monitor his progress in the program. Samuel [name] is doing well in rehab. He has been averaging around 3.8 METs on the treadmill and increased his level to 7 on the XR. He is now using 5 lb for his handweights. We will continue to monitor, Samuel [name] continues to do well in rehab and is close to graduating. He completed his post 6MWT and improved by 24.3%. He also was able to increase his overall average MET level to 3.91 METs. He increased to 6 lb hand weights for resistance training as well. We will continue to monitor his progress until he graduates from the program. Samuel [name] is set to graduate this week. He plans to continue to walk to keep up his exercise.  His wife wants him [place] the church [place]  He has noticed an improvement in his strength and stamina.  Now he is able to go shopping with his wife.    Expected Outcomes Short: Continue to increase workloads to hit THR. Long: Continue to increase strength and stamina. Short: Continue to progressively increase incline on treadmill Long: Continue to increase overall MET level Short: Writer. Long: Continue to exercise independently. Short: Graduate. Long: Continue to exercise independently.              Discharge Exercise Prescription (Final Exercise Prescription Changes):  Exercise Prescription Changes - 03/01/22 1000       Response to Exercise   Blood Pressure (Admit) 122/62    Blood Pressure (Exit) 102/60    Heart Rate (Admit) 78 bpm    Heart Rate (Exercise) 121 bpm    Heart Rate (Exit)  89 bpm    Rating of Perceived Exertion (Exercise) 14    Symptoms none    Duration Continue with 30 min of aerobic exercise without signs/symptoms of physical distress.    Intensity THRR unchanged      Progression   Progression Continue to progress workloads to maintain intensity without signs/symptoms of physical distress.    Average METs 3.91      Resistance Training   Training Prescription Yes    Weight 6 lb    Reps 10-15      Interval Training   Interval Training No      Treadmill   MPH 2.8    Grade 2.5    Minutes 15    METs 4.11      NuStep   Level 5    Minutes 15    METs 4.9      REL-XR   Level 7    Minutes 15      Home Exercise Plan   Plans to continue exercise at Home (comment)   walking   Frequency Add 2 additional days to program exercise sessions.    Initial Home Exercises Provided 01/05/22      Oxygen   Maintain Oxygen Saturation 88% or higher             Nutrition:  Target Goals: Understanding of nutrition guidelines, daily intake of sodium '1500mg'$ , cholesterol '200mg'$ , calories 30% from fat and 7% or less from saturated fats, daily to have 5 or more servings of fruits and vegetables.  Education: All About Nutrition: -Group instruction provided by verbal, written material, interactive activities, discussions, models, and posters to present general guidelines for heart healthy nutrition including fat, fiber, MyPlate, the role of sodium in heart healthy nutrition, utilization of the nutrition label, and utilization of this knowledge for meal planning. Follow up email sent as well. Written material given at graduation. Flowsheet Row Cardiac Rehab from  02/28/2022 in [facility] Cardiac and Pulmonary Rehab  Date 01/24/22  Educator [facility]  Instruction Review Code 1- Verbalizes Understanding       Biometrics:  Pre Biometrics - 12/21/21 1306       Pre Biometrics   Height 5' 7.5" (1.715 m)    Weight 203 lb 1.6 oz (92.1 kg)    Waist Circumference 43 inches    Hip Circumference 42.5 inches    Waist to Hip Ratio 1.01 %    BMI (Calculated) 31.32    Single Leg Stand 7.5 seconds   R            Post Biometrics - 02/28/22 1124        Post  Biometrics   Height 5' 7.5" (1.715 m)    Weight 208 lb 11.2 oz (94.7 kg)    BMI (Calculated) 32.19    Single Leg Stand 13.3 seconds   R            Nutrition Therapy Plan and Nutrition Goals:  Nutrition Therapy & Goals - 12/21/21 0914       Nutrition Therapy   Diet Heart healthy, low Na, T2DM MNT    Drug/Food Interactions Statins/Certain Fruits    Protein (specify units) 85-95g    Fiber 31 grams    Whole Grain Foods 3 servings    Saturated Fats 17 max. grams    Fruits and Vegetables 8 servings/day    Sodium 2 grams      Personal Nutrition Goals   Nutrition Goal ST: practice adding volume, fiber, protein, and healthy  fats to meals with MyPlate as well as review paperwork LT: achieve and maintain A1C <7, at least 31g of fiber/day, <17g saturated fat/day, follow MyPlate guidelines    Comments 72 y.o. M admitted to cardiac rehab s/p NSTEMI and CABG x3. PMHx includes HTN, HLD, T2DM, stg 3a CKD, OSA, RBBB, GERD. PSHx Left and right TKA 2021. Relevant medications includes lipitor, furosemide, metformin, omeprazole. Finis and [name] reports making changes recently to both of their diets after his diabetes diagnosis and after speaking with the doctor about his heart health. Food recall: Coffee (zero sugar creamer) B: 1 glass of 1% milk or tomato juice with breakfast. 1 egg from 2 eggs with 1.5 pieces of sausage or bacon and grits (no longer putting sugar in it) or cheerios (honey nut) or oatmeal  with some butter and honey L: sandwich (whole wheat) with ham and cheese with olive oil and vinegar dressing and mustard or light mayo D: vaires: wife will cook dinner. Lean ground [place] with spaghetti (they have switched to ground [place] and chicken mostly for protein - grilling or baking normally). he likes pinto beans, peas, carrots, turnip greens, bell peppers, and onions. Drinks: lemonade (crystal lite), sometimes small coke zero. Occassionally will have a beer or 2. Discussed heart healthy eating and MyPlate guidelines as well as considerations for BG management and T2DM. his wife is interested in mixing cauliflower rice with regular rice and trying lentils.      Intervention Plan   Intervention Prescribe, educate and counsel regarding individualized specific dietary modifications aiming towards targeted core components such as weight, hypertension, lipid management, diabetes, heart failure and other comorbidities.;Nutrition handout(s) given to patient.    Expected Outcomes Short Term Goal: Understand basic principles of dietary content, such as calories, fat, sodium, cholesterol and nutrients.;Short Term Goal: A plan has been developed with personal nutrition goals set during dietitian appointment.;Long Term Goal: Adherence to prescribed nutrition plan.             Nutrition Assessments:  MEDIFICTS Score Key: ?70 Need to make dietary changes  40-70 Heart Healthy Diet ? 40 Therapeutic Level Cholesterol Diet  Flowsheet Row Cardiac Rehab from 03/05/2022 in [facility] Cardiac [facility] Pul[facility] Rehab  Picture Your Plate Total Score on Discharge 55      Picture Your Plate Scores: <82 Unhealthy dietary pattern with much room for improvement. 41-50 Dietary pattern unlikely to meet recommendations for good health and room for improvement. 51-60 More healthful dietary pattern, with some room for improvement.  >60 Healthy dietary pattern, although there may be some specific behaviors that could be  improved.    Nutrition Goals Re-Evaluation:  Nutrition Goals Re-Evaluation     Samuel [name] Name 01/05/22 1119 01/29/22 1123 03/06/22 1111         Goals   Current Weight 201 lb (91.2 kg) -- --     Nutrition Goal Control portion size. ST: practice adding volume, fiber, protein, and healthy fats to meals with MyPlate as well as review paperwork LT: achieve and maintain A1C <7, at least 31g of fiber/day, <17g saturated fat/day, follow MyPlate guidelines Short: Continue to work on visuals for portion control Long: Continue to eat heart healthy     Comment Samuel [name] wants to lose weight but feels like he eats to much. Informed him to use a smaller plate and watch his portion sizes He is going to try to use smaller plates to help with portion controls. Samuel [name] is still working on his portion size with his  meals. His wife does a lot of the cooking and controls a lot on what goes on his plate. They are eye-balling the portions but he was also encouraged to look at objects (like your hands) that compare to specific portion sizes for a visual.  He is trying to add more veggies in his dinners. He is not a fan of fruit. and hasn't tried to any more of it. We talked about different alternatives to trying out smoothies and trying out different combinations with trial and error. He is very hesitant. He has however, enjoyed eating almonds for a snack and extra protein. He has tried boost but was not a fan of the taste. Samuel [name] is doing better with his diet.  He continues to work on portion control and feeling like he has a good handle on his eating.  He is still getting a good variety of vegetables for dinner.     Expected Outcome Short: use smaller plate when eating. Long: lose more weight. Short: Continue to work on visuals for portion control Long: Continue to eat heart healthy Continue to focus on heart healthy eating              Nutrition Goals Discharge (Final Nutrition Goals Re-Evaluation):  Nutrition Goals  Re-Evaluation - 03/06/22 1111       Goals   Nutrition Goal Short: Continue to work on visuals for portion control Long: Continue to eat heart healthy    Comment Samuel [name] is doing better with his diet.  He continues to work on portion control and feeling like he has a good handle on his eating.  He is still getting a good variety of vegetables for dinner.    Expected Outcome Continue to focus on heart healthy eating             Psychosocial: Target Goals: Acknowledge presence or absence of significant depression and/or stress, maximize coping skills, provide positive support system. Participant is able to verbalize types and ability to use techniques and skills needed for reducing stress and depression.   Education: Stress, Anxiety, and Depression - Group verbal and visual presentation to define topics covered.  Reviews how body is impacted by stress, anxiety, and depression.  Also discusses healthy ways to reduce stress and to treat/manage anxiety and depression.  Written material given at graduation. Flowsheet Row Cardiac Rehab from 02/28/2022 in [facility] Cardiac and Pulmonary Rehab  Date 02/28/22  Educator [facility]  Instruction Review Code 1- [employer] Understanding       Education: Sleep Hygiene -Provides group verbal and written instruction about how sleep can affect your health.  Define sleep hygiene, discuss sleep cycles and impact of sleep habits. Review good sleep hygiene tips.    Initial Review & Psychosocial Screening:  Initial Psych Review & Screening - 12/14/21 1342       Initial Review   Current issues with None Identified      Family Dynamics   Good Support System? Yes   wife  married 38 years     Barriers   Psychosocial barriers to participate in program There are no identifiable barriers or psychosocial needs.      Screening Interventions   Interventions Encouraged to exercise;To provide support and resources with identified psychosocial needs;Provide feedback about the  scores to participant    Expected Outcomes Short Term goal: Utilizing psychosocial counselor, staff and physician to assist with identification of specific Stressors or current issues interfering with healing process. Setting desired goal for each stressor or current  issue identified.;Long Term Goal: Stressors or current issues are controlled or eliminated.;Short Term goal: Identification and review with participant of any Quality of Life or Depression concerns found by scoring the questionnaire.;Long Term goal: The participant improves quality of Life and PHQ9 Scores as seen by post scores and/or verbalization of changes             Quality of Life Scores:   Quality of Life - 03/05/22 1128       Quality of Life Scores   Health/Function Pre 16.37 %    Health/Function Post 25.54 %    Health/Function % Change 56.02 %    Socioeconomic Pre 23.79 %    Socioeconomic Post 29.14 %    Socioeconomic % Change  22.49 %    Psych/Spiritual Pre 24 %    Psych/Spiritual Post 29.14 %    Psych/Spiritual % Change 21.42 %    Family Pre 27.6 %    Family Post 28.8 %    Family % Change 4.35 %    GLOBAL Pre 21.12 %    GLOBAL Post 27.56 %    GLOBAL % Change 30.49 %            Scores of 19 and below usually indicate a poorer quality of life in these areas.  A difference of  2-3 points is a clinically meaningful difference.  A difference of 2-3 points in the total score of the Quality of Life Index has been associated with significant improvement in overall quality of life, self-image, physical symptoms, and general health in studies assessing change in quality of life.  PHQ-9: Review Flowsheet       03/05/2022 12/21/2021 02/07/2015  Depression screen PHQ 2/9  Decreased Interest 0 0 0  Down, Depressed, Hopeless 0 0 0  PHQ - 2 Score 0 0 0  Altered sleeping 0 0 -  Tired, decreased energy 0 1 -  Change in appetite 0 0 -  Feeling bad or failure about yourself  0 0 -  Trouble concentrating 0 0 -   Moving slowly or fidgety/restless 0 0 -  Suicidal thoughts 0 0 -  PHQ-9 Score 0 1 -  Difficult doing work/chores - Somewhat difficult -   Interpretation of Total Score  Total Score Depression Severity:  1-4 = Minimal depression, 5-9 = Mild depression, 10-14 = Moderate depression, 15-19 = Moderately severe depression, 20-27 = Severe depression   Psychosocial Evaluation and Intervention:  Psychosocial Evaluation - 12/14/21 1350       Psychosocial Evaluation & Interventions   Interventions Encouraged to exercise with the program and follow exercise prescription    Comments Marlee has no barriers to attending the program. He lives with his wife of 56 years. She is his support. He wants to get back to normal. He does play golf. He is ready to start the program    Expected Outcomes STG Samuel [name] attends all scheduled sessions, he is able to progress his exercise to the point he can get back to his golf game. LTG Samuel [name] continues to work on his exercise progression after discharge.    Continue Psychosocial Services  Follow up required by staff             Psychosocial Re-Evaluation:  Psychosocial Re-Evaluation     [place] Name 01/05/22 1121 01/29/22 1146 03/06/22 1109         Psychosocial Re-Evaluation   Current issues with None Identified Current Sleep Concerns Current Stress Concerns     Comments Patient  reports no issues with their current mental states, sleep, stress, depression or anxiety. Will follow up with patient in a few weeks for any changes. Samuel [name] is doing well mentally. He does not take any medications for emotional support. He has quite a few appts coming up to assess his carotids as he may need surgery soon. It was found during his surgert. His sleep has been "ok" - he gets up on the middle of the night becasue he uses the bathroom which is caused by his diuertic. He is thinking of asking his doctor if he can take his medication a different time so he is not all up all night going  to the bathroom. His wife is a good support. Samuel [name] is set to graduate this week.  He has learned a lot in education classes.  He has also gotten into the routine of exercise and feels better after he does it.  He is set to get a carotid stent on Thursday and will continue to exercise.  He is sleeping better now and only getting up once a night     Expected Outcomes Short: Continue to exercise regularly to support mental health and notify staff of any changes. Long: maintain mental health and well being through teaching of rehab or prescribed medications independently. Short: Ask doctor about timing of diuertic Long: Continue to maintain posititve attitude and utilize exercise for stress management Short: graduate Long: Continue to focus on the positive     Interventions Encouraged to attend Cardiac Rehabilitation for the exercise Encouraged to attend Cardiac Rehabilitation for the exercise --     Continue Psychosocial Services  Follow up required by staff Follow up required by staff --              Psychosocial Discharge (Final Psychosocial Re-Evaluation):  Psychosocial Re-Evaluation - 03/06/22 1109       Psychosocial Re-Evaluation   Current issues with Current Stress Concerns    Comments Samuel [name] is set to graduate this week.  He has learned a lot in education classes.  He has also gotten into the routine of exercise and feels better after he does it.  He is set to get a carotid stent on Thursday and will continue to exercise.  He is sleeping better now and only getting up once a night    Expected Outcomes Short: graduate Long: Continue to focus on the positive             Vocational Rehabilitation: Provide vocational rehab assistance to qualifying candidates.   Vocational Rehab Evaluation & Intervention:   Education: Education Goals: Education classes will be provided on a variety of topics geared toward better understanding of heart health and risk factor modification. Participant  will state understanding/return demonstration of topics presented as noted by education test scores.  Learning Barriers/Preferences:  Learning Barriers/Preferences - 12/14/21 1343       Learning Barriers/Preferences   Learning Barriers None    Learning Preferences None             General Cardiac Education Topics:  AED/CPR: - Group verbal and written instruction with the use of models to demonstrate the basic use of the AED with the basic ABC's of resuscitation.   Anatomy and Cardiac Procedures: - Group verbal and visual presentation and models provide information about basic cardiac anatomy and function. Reviews the testing methods done to diagnose heart disease and the outcomes of the test results. Describes the treatment choices: Medical Management, Angioplasty, or Coronary Bypass Surgery  for treating various heart conditions including Myocardial Infarction, Angina, Valve Disease, and Cardiac Arrhythmias.  Written material given at graduation. Flowsheet Row Cardiac Rehab from 02/28/2022 in [facility] Cardiac and Pulmonary Rehab  Date 01/31/22  Educator [facility]  Instruction Review Code 1- Verbalizes Understanding       Medication Safety: - Group verbal and visual instruction to review commonly prescribed medications for heart and lung disease. Reviews the medication, class of the drug, and side effects. Includes the steps to properly store meds and maintain the prescription regimen.  Written material given at graduation.   Intimacy: - Group verbal instruction through game format to discuss how heart and lung disease can affect sexual intimacy. Written material given at graduation.. Flowsheet Row Cardiac Rehab from 02/28/2022 in [facility] Cardiac and Pulmonary Rehab  Date 01/03/22  Educator [facility]  Instruction Review Code 1- Verbalizes Understanding       Know Your Numbers and Heart Failure: - Group verbal and visual instruction to discuss disease risk factors for cardiac and pulmonary  disease and treatment options.  Reviews associated critical values for Overweight/Obesity, Hypertension, Cholesterol, and Diabetes.  Discusses basics of heart failure: signs/symptoms and treatments.  Introduces Heart Failure Zone chart for action plan for heart failure.  Written material given at graduation.   Infection Prevention: - Provides verbal and written material to individual with discussion of infection control including proper hand washing and proper equipment cleaning during exercise session.   Falls Prevention: - Provides verbal and written material to individual with discussion of falls prevention and safety. Flowsheet Row Cardiac Rehab from 02/28/2022 in [facility] Cardiac and Pulmonary Rehab  Date 12/14/21  Educator [facility]  Instruction Review Code 1- Verbalizes Understanding       Other: -Provides group and verbal instruction on various topics (see comments)   Knowledge Questionnaire Score:  Knowledge Questionnaire Score - 03/05/22 1127       Knowledge Questionnaire Score   Pre Score 23/26    Post Score 25/26             Core Components/Risk Factors/Patient Goals at Admission:  Personal Goals and Risk Factors at Admission - 12/21/21 1306       Core Components/Risk Factors/Patient Goals on Admission    Weight Management Yes    Intervention Weight Management: Develop a combined nutrition and exercise program designed to reach desired caloric intake, while maintaining appropriate intake of nutrient and fiber, sodium and fats, and appropriate energy expenditure required for the weight goal.;Weight Management: Provide education and appropriate resources to help participant work on and attain dietary goals.;Weight Management/Obesity: Establish reasonable short term and long term weight goals.    Admit Weight 203 lb 1.6 oz (92.1 kg)    Goal Weight: Short Term 196 lb (88.9 kg)    Goal Weight: Long Term 180 lb (81.6 kg)    Expected Outcomes Short Term: Continue to assess and  modify interventions until short term weight is achieved;Long Term: Adherence to nutrition and physical activity/exercise program aimed toward attainment of established weight goal;Weight Loss: Understanding of general recommendations for a balanced deficit meal plan, which promotes 1-2 lb weight loss per week and includes a negative energy balance of [phone] kcal/d    Diabetes Yes    Intervention Provide education about signs/symptoms and action to take for hypo/hyperglycemia.;Provide education about proper nutrition, including hydration, and aerobic/resistive exercise prescription along with prescribed medications to achieve blood glucose in normal ranges: Fasting glucose 65-99 mg/dL    Expected Outcomes Short Term: Participant verbalizes understanding  of the signs/symptoms and immediate care of hyper/hypoglycemia, proper foot care and importance of medication, aerobic/resistive exercise and nutrition plan for blood glucose control.;Long Term: Attainment of HbA1C < 7%.    Hypertension Yes    Intervention Provide education on lifestyle modifcations including regular physical activity/exercise, weight management, moderate sodium restriction and increased consumption of fresh fruit, vegetables, and low fat dairy, alcohol moderation, and smoking cessation.;Monitor prescription use compliance.    Expected Outcomes Short Term: Continued assessment and intervention until BP is < 140/77m HG in hypertensive participants. < 130/889mHG in hypertensive participants with diabetes, heart failure or chronic kidney disease.;Long Term: Maintenance of blood pressure at goal levels.    Lipids Yes    Intervention Provide education and support for participant on nutrition & aerobic/resistive exercise along with prescribed medications to achieve LDL '70mg'$ , HDL >'40mg'$ .    Expected Outcomes Short Term: Participant states understanding of desired cholesterol values and is compliant with medications prescribed. Participant is  following exercise prescription and nutrition guidelines.;Long Term: Cholesterol controlled with medications as prescribed, with individualized exercise RX and with personalized nutrition plan. Value goals: LDL < '70mg'$ , HDL > 40 mg.             Education:Diabetes - Individual verbal and written instruction to review signs/symptoms of diabetes, desired ranges of glucose level fasting, after meals and with exercise. Acknowledge that pre and post exercise glucose checks will be done for 3 sessions at entry of program.   Core Components/Risk Factors/Patient Goals Review:   Goals and Risk Factor Review     Row Name 01/05/22 1116 01/29/22 1141 03/06/22 1112         Core Components/Risk Factors/Patient Goals Review   Personal Goals Review Weight Management/Obesity Weight Management/Obesity;Diabetes;Hypertension;Lipids Weight Management/Obesity;Diabetes;Hypertension;Lipids     Review HeKingstinas been doing well since the start of the program. He wants to be able to play golf again without getting short of breath. He is trying to lose some weight. He has lose two pounds since the start of the program snd would like to reach a weight goal of 180 pounds. He is going to try to reach obtainable goals and lose weight little by little. HeGregoiredmits he has not lost weight because he is eating food he knows he is eating too much of. He used to be down under 190 but is over 200 lb. His goals weight is 180 lb. He does check his blood sugar every morning and it has been running higher than normal, around 150. He was encouraged to let his doctor know and to send a log over. He has an appt with them on 1/8- encouraged to do it before then. He does have a  BP cuff to use but does not check it at home. He is going to try to be more diligent with it. Talked about importance on maintaining at home. BP at rehab are stable. He is taking all his  medications as prescribed. HeBos set to graduate.  His weight is holding  steady.  He continues to work on portion control.  His weight was up some after his beach trip over weekend.  He continues to have good pressures and stable sugars.  He is doing well on his meds.  He had a good check up at doctor's office yesterday and they were pleased with his progress and numbers.  He is set for carotid stent this week and cardiology follow up next week.     Expected Outcomes Short: lose  5 pounds in the next few weeks. Long: maintain reach weight goal. Short: Continue to work on weight loss and start checking BP at sugars at home, talk to doctor about sugars Long: Continue to manage lifestyle risk factors Continue to montior risk factors.              Core Components/Risk Factors/Patient Goals at Discharge (Final Review):   Goals and Risk Factor Review - 03/06/22 1112       Core Components/Risk Factors/Patient Goals Review   Personal Goals Review Weight Management/Obesity;Diabetes;Hypertension;Lipids    Review Samuel [name] is set to graduate.  His weight is holding steady.  He continues to work on portion control.  His weight was up some after his beach trip over weekend.  He continues to have good pressures and stable sugars.  He is doing well on his meds.  He had a good check up at doctor's office yesterday and they were pleased with his progress and numbers.  He is set for carotid stent this week and cardiology follow up next week.    Expected Outcomes Continue to montior risk factors.             ITP Comments:  ITP Comments     Row Name 12/14/21 1354 12/21/21 1246 12/25/21 1125 12/27/21 1041 01/24/22 1107   ITP Comments Virtual orientation call completed today. he has an appointment on Date: 12/21/2021  for EP eval and gym Orientation.  Documentation of diagnosis [facility] be found in [facility] Date: 11/22/2021 . Completed 6MWT and gym orientation. Initial ITP created and sent for review to Dr Ramonita Lab, Medical Director. First full day of exercise!  Patient was oriented to gym and  equipment including functions, settings, policies, and procedures.  Patient's individual exercise prescription and treatment plan were reviewed.  All starting workloads were established based on the results of the 6 minute walk test done at initial orientation visit.  The plan for exercise progression was also introduced and progression will be customized based on patient's performance and goals. 30 Day review completed. Medical Director ITP review done, changes made as directed, and signed approval by Medical Director.    NEW TO PROGRAM 30 Day review completed. Medical Director ITP review done, changes made as directed, and signed approval by Medical Director.    [place] Name 02/21/22 1133 03/07/22 1127         ITP Comments 30 Day review completed. Medical Director ITP review done, changes made as directed, and signed approval by Medical Director. Samuel [name] graduated today from  rehab with 36 sessions completed.  Details of the patient's exercise prescription and what He needs to do in order to continue the prescription and progress were discussed with patient.  Patient was given a copy of prescription and goals.  Patient verbalized understanding.  Samuel [name] plans to continue to exercise by walking.               Comments: Discharge ITP

## 2022-03-07 NOTE — Telephone Encounter (Signed)
Spoke with the patient's spouse and he is scheduled with Dr. Lucky Cowboy for a right carotid stent placement on 03/12/22 with a 9:30 [facility]. Pre-procedure instructions were discussed and patient's spouse stated she understood.

## 2022-03-07 NOTE — Progress Notes (Signed)
Discharge Summary: Acy Orsak (DOB: 1950-08-20)  Mallie Mussel graduated today from  rehab with 36 sessions completed.  Details of the patient's exercise prescription and what He needs to do in order to continue the prescription and progress were discussed with patient.  Patient was given a copy of prescription and goals.  Patient verbalized understanding.  Xander plans to continue to exercise by walking.   [place] Name 12/21/21 1252 02/28/22 1121       6 Minute Walk   Phase Initial Discharge    Distance 1235 feet 1535 feet    Distance % Change -- 24.3 %    Distance Feet Change -- 300 ft    Walk Time 6 minutes 6 minutes    # of Rest Breaks 0 0    MPH 2.34 2.91    METS 2.49 3.39    RPE 11 11    Perceived Dyspnea  2 0    VO2 Peak 8.71 11.87    Symptoms Yes (comment) No    Comments SOB --    Resting HR 64 bpm 79 bpm    Resting BP 114/66 138/72    Resting Oxygen Saturation  98 % 95 %    Exercise Oxygen Saturation  during 6 min walk 97 % 94 %    Max Ex. HR 104 bpm 115 bpm    Max Ex. BP 130/80 168/74    2 Minute Post BP 122/76 --

## 2022-03-12 ENCOUNTER — Inpatient Hospital Stay
Admission: RE | Admit: 2022-03-12 | Discharge: 2022-03-14 | DRG: 036 | Disposition: A | Discharge status: Home / Self Care | Payer: Medicare Other | Attending: Vascular Surgery | Admitting: Vascular Surgery

## 2022-03-12 ENCOUNTER — Other Ambulatory Visit: Payer: Self-pay

## 2022-03-12 ENCOUNTER — Encounter
Admission: RE | Disposition: A | Discharge status: Home / Self Care | Payer: Self-pay | Source: Home / Self Care | Attending: Vascular Surgery

## 2022-03-12 ENCOUNTER — Encounter: Payer: Self-pay | Admitting: Vascular Surgery

## 2022-03-12 DIAGNOSIS — Z96653 Presence of artificial knee joint, bilateral: Secondary | ICD-10-CM | POA: Diagnosis present

## 2022-03-12 DIAGNOSIS — Z79899 Other long term (current) drug therapy: Secondary | ICD-10-CM

## 2022-03-12 DIAGNOSIS — I6521 Occlusion and stenosis of right carotid artery: Principal | ICD-10-CM | POA: Diagnosis present

## 2022-03-12 DIAGNOSIS — K219 Gastro-esophageal reflux disease without esophagitis: Secondary | ICD-10-CM | POA: Diagnosis present

## 2022-03-12 DIAGNOSIS — E785 Hyperlipidemia, unspecified: Secondary | ICD-10-CM | POA: Diagnosis present

## 2022-03-12 DIAGNOSIS — I9589 Other hypotension: Secondary | ICD-10-CM | POA: Diagnosis not present

## 2022-03-12 DIAGNOSIS — Z951 Presence of aortocoronary bypass graft: Secondary | ICD-10-CM | POA: Diagnosis not present

## 2022-03-12 DIAGNOSIS — Z7984 Long term (current) use of oral hypoglycemic drugs: Secondary | ICD-10-CM | POA: Diagnosis not present

## 2022-03-12 DIAGNOSIS — Z8249 Family history of ischemic heart disease and other diseases of the circulatory system: Secondary | ICD-10-CM | POA: Diagnosis not present

## 2022-03-12 DIAGNOSIS — Z7902 Long term (current) use of antithrombotics/antiplatelets: Secondary | ICD-10-CM

## 2022-03-12 DIAGNOSIS — I129 Hypertensive chronic kidney disease with stage 1 through stage 4 chronic kidney disease, or unspecified chronic kidney disease: Secondary | ICD-10-CM | POA: Diagnosis present

## 2022-03-12 DIAGNOSIS — Z7982 Long term (current) use of aspirin: Secondary | ICD-10-CM | POA: Diagnosis not present

## 2022-03-12 DIAGNOSIS — N182 Chronic kidney disease, stage 2 (mild): Secondary | ICD-10-CM | POA: Diagnosis present

## 2022-03-12 DIAGNOSIS — Z8673 Personal history of transient ischemic attack (TIA), and cerebral infarction without residual deficits: Secondary | ICD-10-CM | POA: Diagnosis not present

## 2022-03-12 DIAGNOSIS — Q2549 Other congenital malformations of aorta: Secondary | ICD-10-CM

## 2022-03-12 DIAGNOSIS — G4733 Obstructive sleep apnea (adult) (pediatric): Secondary | ICD-10-CM | POA: Diagnosis present

## 2022-03-12 DIAGNOSIS — Z85828 Personal history of other malignant neoplasm of skin: Secondary | ICD-10-CM | POA: Diagnosis not present

## 2022-03-12 HISTORY — PX: CAROTID PTA/STENT INTERVENTION: CATH118231

## 2022-03-12 LAB — POCT ACTIVATED CLOTTING TIME: Activated Clotting Time: 277 seconds

## 2022-03-12 LAB — CREATININE, SERUM
Creatinine, Ser: 1.14 mg/dL (ref 0.61–1.24)
GFR, Estimated: >60 mL/min (ref >60)

## 2022-03-12 LAB — BUN: BUN: 26 mg/dL — ABNORMAL HIGH (ref 8–23)

## 2022-03-12 LAB — GLUCOSE, CAPILLARY: Glucose-Capillary: 123 mg/dL — ABNORMAL HIGH (ref 70–99)

## 2022-03-12 LAB — MRSA NEXT GEN BY PCR, NASAL: MRSA by PCR Next Gen: NOT DETECTED

## 2022-03-12 SURGERY — CAROTID PTA/STENT INTERVENTION
Anesthesia: Moderate Sedation | Laterality: Right

## 2022-03-12 MED ORDER — GUAIFENESIN-DM 100-10 MG/5ML PO SYRP: 15.0000 mL | ORAL_SOLUTION | ORAL | Status: DC | PRN

## 2022-03-12 MED ORDER — SODIUM CHLORIDE 0.9 % IV SOLN: 500.0000 mL | Freq: Once | INTRAVENOUS | Status: DC | PRN

## 2022-03-12 MED ORDER — CEFAZOLIN SODIUM-DEXTROSE 2-4 GM/100ML-% IV SOLN: 2.0000 g | INTRAVENOUS | Status: DC

## 2022-03-12 MED ORDER — ATROPINE SULFATE 1 MG/10ML IJ SOSY
PREFILLED_SYRINGE | INTRAMUSCULAR | Status: DC | PRN
  Administered 2022-03-12: 1 mg via INTRAVENOUS

## 2022-03-12 MED ORDER — PHENYLEPHRINE 80 MCG/ML (10ML) SYRINGE FOR IV PUSH (FOR BLOOD PRESSURE SUPPORT)
PREFILLED_SYRINGE | INTRAVENOUS | Status: AC
  Administered 2022-03-12: 2 ug
  Filled 2022-03-12: qty 10

## 2022-03-12 MED ORDER — METOPROLOL TARTRATE 5 MG/5ML IV SOLN: 2.0000 mg | INTRAVENOUS | Status: DC | PRN

## 2022-03-12 MED ORDER — ONDANSETRON HCL 4 MG/2ML IJ SOLN
4.0000 mg | Freq: Four times a day (QID) | INTRAMUSCULAR | Status: DC | PRN
  Administered 2022-03-12: 4 mg via INTRAVENOUS
  Filled 2022-03-12: qty 2

## 2022-03-12 MED ORDER — SODIUM CHLORIDE 0.9 % IV SOLN
INTRAVENOUS | Status: DC | PRN
  Administered 2022-03-12: 100 mL/h via INTRAVENOUS

## 2022-03-12 MED ORDER — HEPARIN SODIUM (PORCINE) 1000 UNIT/ML IJ SOLN
INTRAMUSCULAR | Status: AC
  Filled 2022-03-12: qty 10

## 2022-03-12 MED ORDER — HYDRALAZINE HCL 20 MG/ML IJ SOLN: 5.0000 mg | INTRAMUSCULAR | Status: DC | PRN

## 2022-03-12 MED ORDER — MAGNESIUM SULFATE 2 GM/50ML IV SOLN: 2.0000 g | Freq: Every day | INTRAVENOUS | Status: DC | PRN

## 2022-03-12 MED ORDER — DOPAMINE-DEXTROSE 3.2-5 MG/ML-% IV SOLN
INTRAVENOUS | Status: AC
  Administered 2022-03-12: 5 ug/kg/min via INTRAVENOUS
  Filled 2022-03-12: qty 250

## 2022-03-12 MED ORDER — CEFAZOLIN SODIUM-DEXTROSE 2-4 GM/100ML-% IV SOLN
2.0000 g | Freq: Three times a day (TID) | INTRAVENOUS | Status: AC
  Administered 2022-03-12 – 2022-03-13 (×2): 2 g via INTRAVENOUS
  Filled 2022-03-12 (×2): qty 100

## 2022-03-12 MED ORDER — HYDROCHLOROTHIAZIDE 25 MG PO TABS: 25.0000 mg | ORAL_TABLET | Freq: Every day | ORAL | Status: DC

## 2022-03-12 MED ORDER — CLOPIDOGREL BISULFATE 75 MG PO TABS
75.0000 mg | ORAL_TABLET | Freq: Every day | ORAL | Status: DC
  Administered 2022-03-13 – 2022-03-14 (×2): 75 mg via ORAL
  Filled 2022-03-12 (×2): qty 1

## 2022-03-12 MED ORDER — ATROPINE SULFATE 1 MG/10ML IJ SOSY
PREFILLED_SYRINGE | INTRAMUSCULAR | Status: AC
  Filled 2022-03-12: qty 10

## 2022-03-12 MED ORDER — PHENYLEPHRINE HCL-NACL 20-0.9 MG/250ML-% IV SOLN: 0.0000 ug/min | INTRAVENOUS | Status: DC

## 2022-03-12 MED ORDER — ATORVASTATIN CALCIUM 20 MG PO TABS
80.0000 mg | ORAL_TABLET | Freq: Every day | ORAL | Status: DC
  Administered 2022-03-13 – 2022-03-14 (×2): 80 mg via ORAL
  Filled 2022-03-12 (×2): qty 4

## 2022-03-12 MED ORDER — METHYLPREDNISOLONE SODIUM SUCC 125 MG IJ SOLR: 125.0000 mg | Freq: Once | INTRAMUSCULAR | Status: DC | PRN

## 2022-03-12 MED ORDER — FENTANYL CITRATE (PF) 100 MCG/2ML IJ SOLN
INTRAMUSCULAR | Status: DC | PRN
  Administered 2022-03-12: 50 ug via INTRAVENOUS

## 2022-03-12 MED ORDER — FAMOTIDINE IN NACL 20-0.9 MG/50ML-% IV SOLN
20.0000 mg | Freq: Two times a day (BID) | INTRAVENOUS | Status: DC
  Administered 2022-03-12 – 2022-03-13 (×4): 20 mg via INTRAVENOUS
  Filled 2022-03-12 (×4): qty 50

## 2022-03-12 MED ORDER — DOPAMINE-DEXTROSE 3.2-5 MG/ML-% IV SOLN: 0.0000 ug/kg/min | INTRAVENOUS | Status: DC

## 2022-03-12 MED ORDER — HYDROMORPHONE HCL 1 MG/ML IJ SOLN: 1.0000 mg | Freq: Once | INTRAMUSCULAR | Status: DC | PRN

## 2022-03-12 MED ORDER — PHENOL 1.4 % MT LIQD: 1.0000 | OROMUCOSAL | Status: DC | PRN

## 2022-03-12 MED ORDER — PANTOPRAZOLE SODIUM 40 MG PO TBEC
40.0000 mg | DELAYED_RELEASE_TABLET | Freq: Every day | ORAL | Status: DC
  Administered 2022-03-13 – 2022-03-14 (×2): 40 mg via ORAL
  Filled 2022-03-12 (×2): qty 1

## 2022-03-12 MED ORDER — ASPIRIN 81 MG PO TBEC
81.0000 mg | DELAYED_RELEASE_TABLET | Freq: Every day | ORAL | Status: DC
  Administered 2022-03-13 – 2022-03-14 (×2): 81 mg via ORAL
  Filled 2022-03-12 (×2): qty 1

## 2022-03-12 MED ORDER — ACETAMINOPHEN 500 MG PO TABS: 500.0000 mg | ORAL_TABLET | Freq: Four times a day (QID) | ORAL | Status: DC | PRN

## 2022-03-12 MED ORDER — PHENYLEPHRINE HCL-NACL 20-0.9 MG/250ML-% IV SOLN: 25.0000 ug/min | INTRAVENOUS | Status: DC

## 2022-03-12 MED ORDER — CEFAZOLIN SODIUM-DEXTROSE 2-4 GM/100ML-% IV SOLN
INTRAVENOUS | Status: AC
  Filled 2022-03-12: qty 100

## 2022-03-12 MED ORDER — DOPAMINE-DEXTROSE 3.2-5 MG/ML-% IV SOLN
INTRAVENOUS | Status: AC
  Filled 2022-03-12: qty 250

## 2022-03-12 MED ORDER — DIPHENHYDRAMINE HCL 50 MG/ML IJ SOLN: 50.0000 mg | Freq: Once | INTRAMUSCULAR | Status: DC | PRN

## 2022-03-12 MED ORDER — MIDAZOLAM HCL 2 MG/ML PO SYRP: 8.0000 mg | ORAL_SOLUTION | Freq: Once | ORAL | Status: DC | PRN

## 2022-03-12 MED ORDER — POTASSIUM CHLORIDE CRYS ER 20 MEQ PO TBCR: 20.0000 meq | EXTENDED_RELEASE_TABLET | Freq: Every day | ORAL | Status: DC | PRN

## 2022-03-12 MED ORDER — FENTANYL CITRATE (PF) 100 MCG/2ML IJ SOLN
INTRAMUSCULAR | Status: AC
  Filled 2022-03-12: qty 2

## 2022-03-12 MED ORDER — ACETAMINOPHEN 325 MG PO TABS: 325.0000 mg | ORAL_TABLET | ORAL | Status: DC | PRN

## 2022-03-12 MED ORDER — PHENYLEPHRINE 80 MCG/ML (10ML) SYRINGE FOR IV PUSH (FOR BLOOD PRESSURE SUPPORT): 80.0000 ug | PREFILLED_SYRINGE | Freq: Once | INTRAVENOUS | Status: DC | PRN

## 2022-03-12 MED ORDER — FAMOTIDINE 20 MG PO TABS: 40.0000 mg | ORAL_TABLET | Freq: Once | ORAL | Status: DC | PRN

## 2022-03-12 MED ORDER — LISINOPRIL 20 MG PO TABS: 20.0000 mg | ORAL_TABLET | Freq: Every day | ORAL | Status: DC

## 2022-03-12 MED ORDER — AMLODIPINE BESYLATE 5 MG PO TABS: 5.0000 mg | ORAL_TABLET | Freq: Every day | ORAL | Status: DC

## 2022-03-12 MED ORDER — LISINOPRIL-HYDROCHLOROTHIAZIDE 20-25 MG PO TABS: 1.0000 | ORAL_TABLET | Freq: Every day | ORAL | Status: DC

## 2022-03-12 MED ORDER — IODIXANOL 320 MG/ML IV SOLN
INTRAVENOUS | Status: DC | PRN
  Administered 2022-03-12: 75 mL via INTRA_ARTERIAL

## 2022-03-12 MED ORDER — METOPROLOL TARTRATE 25 MG PO TABS: 12.5000 mg | ORAL_TABLET | Freq: Two times a day (BID) | ORAL | Status: DC

## 2022-03-12 MED ORDER — SODIUM CHLORIDE 0.9 % IV SOLN: 250.0000 mL | INTRAVENOUS | Status: DC

## 2022-03-12 MED ORDER — MIDAZOLAM HCL 5 MG/5ML IJ SOLN
INTRAMUSCULAR | Status: AC
  Filled 2022-03-12: qty 5

## 2022-03-12 MED ORDER — ACETAMINOPHEN 325 MG RE SUPP: 325.0000 mg | RECTAL | Status: DC | PRN

## 2022-03-12 MED ORDER — CEFAZOLIN SODIUM-DEXTROSE 1-4 GM/50ML-% IV SOLN
INTRAVENOUS | Status: DC | PRN
  Administered 2022-03-12: 2 g via INTRAVENOUS

## 2022-03-12 MED ORDER — CHLORHEXIDINE GLUCONATE CLOTH 2 % EX PADS: 6.0000 | MEDICATED_PAD | Freq: Every day | CUTANEOUS | Status: DC

## 2022-03-12 MED ORDER — ONDANSETRON HCL 4 MG/2ML IJ SOLN: 4.0000 mg | Freq: Four times a day (QID) | INTRAMUSCULAR | Status: DC | PRN

## 2022-03-12 MED ORDER — MORPHINE SULFATE (PF) 4 MG/ML IV SOLN: 2.0000 mg | INTRAVENOUS | Status: DC | PRN

## 2022-03-12 MED ORDER — HEPARIN SODIUM (PORCINE) 1000 UNIT/ML IJ SOLN
INTRAMUSCULAR | Status: DC | PRN
  Administered 2022-03-12: 2000 [IU] via INTRAVENOUS
  Administered 2022-03-12: 8000 [IU] via INTRAVENOUS

## 2022-03-12 MED ORDER — OXYCODONE-ACETAMINOPHEN 5-325 MG PO TABS: 1.0000 | ORAL_TABLET | ORAL | Status: DC | PRN

## 2022-03-12 MED ORDER — LABETALOL HCL 5 MG/ML IV SOLN: 10.0000 mg | INTRAVENOUS | Status: DC | PRN

## 2022-03-12 MED ORDER — LORATADINE 10 MG PO TABS
10.0000 mg | ORAL_TABLET | Freq: Every day | ORAL | Status: DC
  Administered 2022-03-13: 10 mg via ORAL
  Filled 2022-03-12: qty 1

## 2022-03-12 MED ORDER — ALUM & MAG HYDROXIDE-SIMETH 200-200-20 MG/5ML PO SUSP: 15.0000 mL | ORAL | Status: DC | PRN

## 2022-03-12 MED ORDER — OXYBUTYNIN CHLORIDE ER 5 MG PO TB24
5.0000 mg | ORAL_TABLET | Freq: Every day | ORAL | Status: DC
  Administered 2022-03-13: 5 mg via ORAL
  Filled 2022-03-12 (×2): qty 1

## 2022-03-12 MED ORDER — SODIUM CHLORIDE 0.9 % IV BOLUS
INTRAVENOUS | Status: DC | PRN
  Administered 2022-03-12: 250 mL via INTRAVENOUS

## 2022-03-12 MED ORDER — SODIUM CHLORIDE 0.9 % IV SOLN: INTRAVENOUS | Status: DC

## 2022-03-12 MED ORDER — MIDAZOLAM HCL 2 MG/2ML IJ SOLN
INTRAMUSCULAR | Status: DC | PRN
  Administered 2022-03-12 (×2): 1 mg via INTRAVENOUS

## 2022-03-12 SURGICAL SUPPLY — 20 items
BALLN VTRAC 4.5X30X135 (BALLOONS) ×1
BALLOON VTRAC 4.5X30X135 (BALLOONS) IMPLANT
CATH ANGIO 5F PIGTAIL 100CM (CATHETERS) IMPLANT
CATH HEADHUNTER H1 5F 100CM (CATHETERS) IMPLANT
COVER DRAPE FLUORO 36X44 (DRAPES) IMPLANT
COVER PROBE U/S 5X48 (MISCELLANEOUS) IMPLANT
DEVICE EMBOSHIELD NAV6 4.0-7.0 (FILTER) IMPLANT
DEVICE SAFEGUARD 24CM (GAUZE/BANDAGES/DRESSINGS) IMPLANT
DEVICE STARCLOSE SE CLOSURE (Vascular Products) IMPLANT
DEVICE TORQUE .025-.038 (MISCELLANEOUS) IMPLANT
GLIDEWIRE ANGLED SS 035X260CM (WIRE) IMPLANT
GUIDEWIRE VASC STIFF .038X260 (WIRE) IMPLANT
KIT CAROTID MANIFOLD (MISCELLANEOUS) IMPLANT
KIT ENCORE 26 ADVANTAGE (KITS) IMPLANT
PACK ANGIOGRAPHY (CUSTOM PROCEDURE TRAY) ×1 IMPLANT
SHEATH BRITE TIP 5FRX11 (SHEATH) IMPLANT
SHEATH SHUTTLE 6FR (SHEATH) IMPLANT
STENT XACT CAR 9-7X40X136 (Permanent Stent) IMPLANT
SYR MEDRAD MARK 7 150ML (SYRINGE) IMPLANT
WIRE GUIDERIGHT .035X150 (WIRE) IMPLANT

## 2022-03-12 NOTE — Op Note (Signed)
OPERATIVE NOTE DATE: 03/12/2022  PROCEDURE:  Ultrasound guidance for vascular access right femoral artery  Placement of a 9 mm proximal 7 mm distal, 4 cm long Exact stent with the use of the NAV-6 embolic protection device in the right carotid artery  PRE-OPERATIVE DIAGNOSIS: 1.  High-grade right carotid artery stenosis.   POST-OPERATIVE DIAGNOSIS:  Same as above  SURGEON: Leotis Pain, MD  ASSISTANT(S): None  ANESTHESIA: local/MCS  ESTIMATED BLOOD LOSS: 25 cc  CONTRAST: 75 cc  FLUORO TIME: 5.9 minutes  MODERATE CONSCIOUS SEDATION TIME:  Approximately 40 minutes using 2 mg of Versed and 50 mcg of Fentanyl  FINDING(S): 1.   90% right internal carotid artery stenosis  SPECIMEN(S):   none  INDICATIONS:   Patient is a 72 y.o. male who presents with high-grade right carotid artery stenosis.  The patient has elected to proceed with right carotid artery stent placement for stroke risk reduction.  Risks and benefits were discussed and informed consent was obtained.   DESCRIPTION: After obtaining full informed written consent, the patient was brought back to the vascular suite and placed supine upon the table.  The patient received IV antibiotics prior to induction. Moderate conscious sedation was administered during a face to face encounter with the patient throughout the procedure with my supervision of the RN administering medicines and monitoring the patients vital signs and mental status throughout from the start of the procedure until the patient was taken to the recovery room.  After obtaining adequate anesthesia, the patient was prepped and draped in the standard fashion.   The right femoral artery was visualized with ultrasound and found to be widely patent. It was then accessed under direct ultrasound guidance without difficulty with a Seldinger needle. A permanent image was recorded. A J-wire was placed and we then placed a 6 French sheath. The patient was then heparinized and  a total of 10,000 units of intravenous heparin were given and an ACT was checked to confirm successful anticoagulation. A pigtail catheter was then placed into the ascending aorta. This showed fairly normal origins of the great vessels with a type II aortic arch. I then selectively cannulated the innominate artery without difficulty with a Hunter catheter and advanced into the mid right common carotid artery.  Cervical and cerebral carotid angiography was then performed. There was essentially no flow in the anterior cerebral artery with somewhat diminutive flow in the middle cerebral artery. The carotid bifurcation demonstrated a very high-grade stenosis of the proximal internal carotid artery in the 90% range.  I then advanced into the external carotid artery with a Glidewire and the headhunter catheter and then exchanged for the Amplatz Super Stiff wire. Over the Amplatz Super Stiff wire, a 6 [place] shuttle sheath was placed into the mid common carotid artery. I then used the NAV-6  Embolic protection device and crossed the lesion and parked this in the distal internal carotid artery at the base of the skull.  I then selected a 9 mm proximal, 7 mm distal, 4 cm long exact stent. This was deployed across the lesion encompassing it in its entirety. A 4.5 mm diameter by 3 cm length balloon was used to post dilate the stent. Only about a 15-20% residual stenosis was present after angioplasty. Completion angiogram showed normal intracranial filling without new defects. At this point I elected to terminate the procedure. The sheath was removed and StarClose closure device was deployed in the right femoral artery with excellent hemostatic result. The patient was  taken to the recovery room in stable condition having tolerated the procedure well.  COMPLICATIONS: none  CONDITION: stable  Leotis Pain 03/12/2022 11:29 AM   This note was created with Dragon Medical transcription system. Any errors in dictation are  purely unintentional.

## 2022-03-12 NOTE — Progress Notes (Signed)
BP remains Low: notified MD again. Dopamine gtt started at 5 mcg/kg/min. Per orders now. Pt. Asymptomatic. No neuro deficits at present. Bed remains in trendelenburg at present.

## 2022-03-12 NOTE — Progress Notes (Signed)
VAST consult received to start an USGIV for pressors. Spoke with RN who stated pressors are being weaned and current IV is working well.

## 2022-03-12 NOTE — Interval H&P Note (Signed)
History and Physical Interval Note:  03/12/2022 9:31 AM  Samuel [name]  has presented today for surgery, with the diagnosis of R Carotid Stent    ABBOTT    Carotid artery stenosis.  The various methods of treatment have been discussed with the patient and family. After consideration of risks, benefits and other options for treatment, the patient has consented to  Procedure(s): CAROTID PTA/STENT INTERVENTION (Right) as a surgical intervention.  The patient's history has been reviewed, patient examined, no change in status, stable for surgery.  I have reviewed the patient's chart and labs.  Questions were answered to the patient's satisfaction.     Leotis Pain

## 2022-03-12 NOTE — Progress Notes (Signed)
Found patient on home cpap resting quietly in no distress.

## 2022-03-12 NOTE — Progress Notes (Addendum)
Pt. BP remaining low , despite NS "wide open" upon return from procedure. Notified MD: ordered Neo 2 ml IVP: given with little to no change in BP. Pt. Put in Trendelenburg position. No neuro deficits noted at all.

## 2022-03-12 NOTE — Progress Notes (Signed)
Called Dr. Delana Meyer regarding patient's sleep apnea and home use of CPAP. Order received to use home CPAP. CPAP placed on by patient when he to sleep.

## 2022-03-13 ENCOUNTER — Encounter: Payer: Self-pay | Admitting: Vascular Surgery

## 2022-03-13 LAB — BASIC METABOLIC PANEL
Anion gap: 9 (ref 5–15)
BUN: 22 mg/dL (ref 8–23)
CO2: 21 mmol/L — ABNORMAL LOW (ref 22–32)
Calcium: 8.8 mg/dL — ABNORMAL LOW (ref 8.9–10.3)
Chloride: 107 mmol/L (ref 98–111)
Creatinine, Ser: 1.02 mg/dL (ref 0.61–1.24)
GFR, Estimated: >60 mL/min (ref >60)
Glucose, Bld: 129 mg/dL — ABNORMAL HIGH (ref 70–99)
Potassium: 3.7 mmol/L (ref 3.5–5.1)
Sodium: 137 mmol/L (ref 135–145)

## 2022-03-13 LAB — CBC
HCT: 36.2 % — ABNORMAL LOW (ref 39.0–52.0)
Hemoglobin: 12.1 g/dL — ABNORMAL LOW (ref 13.0–17.0)
MCH: 28.4 pg (ref 26.0–34.0)
MCHC: 33.4 g/dL (ref 30.0–36.0)
MCV: 85 fL (ref 80.0–100.0)
Platelets: 201 10*3/uL (ref 150–400)
RBC: 4.26 MIL/uL (ref 4.22–5.81)
RDW: 15.3 % (ref 11.5–15.5)
WBC: 10.8 10*3/uL — ABNORMAL HIGH (ref 4.0–10.5)
nRBC: 0 % (ref 0.0–0.2)

## 2022-03-13 MED ORDER — DOCUSATE SODIUM 100 MG PO CAPS
100.0000 mg | ORAL_CAPSULE | Freq: Every day | ORAL | Status: DC | PRN
  Administered 2022-03-13 – 2022-03-14 (×2): 100 mg via ORAL
  Filled 2022-03-13 (×2): qty 1

## 2022-03-13 NOTE — Progress Notes (Signed)
Progress Note    03/13/2022 10:38 AM 1 Day Post-Op  Subjective: Samuel [name] is a 72 year old male who is now status post 1 day right carotid stent placement for high-grade right carotid stenosis.  Immediately after the procedure patient was taken to ICU and a dopamine drip was started for low blood pressure and low heart rate.  This morning on examination I find the patient resting comfortably in bed with his wife at the bedside.  Patient's blood pressure remains a little low with heart rate in the 50s.  Dopamine which had been shut off at some point through the night was restarted this morning.  No complications overnight patient remained stable.  Plan: Plan is to watch the patient throughout the day wean him off of dopamine drip.  Monitor his blood pressures as well as his heart rate.  We will not reinstitute any of his home blood pressure medications at this point in time.   Vitals:   03/13/22 0500 03/13/22 0600  BP: 107/62 (!) 111/59  Pulse: (!) 52 (!) 59  Resp: 14 17  Temp:    SpO2: 95% 92%   Physical Exam: Cardiac:  Bradycardic and regular Lungs:  Clear throughout Incisions:  Right groin C/D/I Extremities:  Bilateral Lower extremity pulses are palpable  Abdomen:  Soft, NT/ND positive bowel sounds.  Neurologic: AAOX3  CBC    Component Value Date/Time   WBC 10.8 (H) 03/13/2022 0320   RBC 4.26 03/13/2022 0320   HGB 12.1 (L) 03/13/2022 0320   HCT 36.2 (L) 03/13/2022 0320   PLT 201 03/13/2022 0320   MCV 85.0 03/13/2022 0320   MCH 28.4 03/13/2022 0320   MCHC 33.4 03/13/2022 0320   RDW 15.3 03/13/2022 0320   LYMPHSABS 3.0 11/20/2021 1956   MONOABS 0.5 11/20/2021 1956   EOSABS 0.2 11/20/2021 1956   BASOSABS 0.0 11/20/2021 1956    BMET    Component Value Date/Time   NA 137 03/13/2022 0320   K 3.7 03/13/2022 0320   CL 107 03/13/2022 0320   CO2 21 (L) 03/13/2022 0320   GLUCOSE 129 (H) 03/13/2022 0320   BUN 22 03/13/2022 0320   CREATININE 1.02 03/13/2022 0320    CALCIUM 8.8 (L) 03/13/2022 0320   GFRNONAA >60 03/13/2022 0320   GFRAA >60 11/03/2019 1402    INR    Component Value Date/Time   INR 1.2 11/24/2021 1259     Intake/Output Summary (Last 24 hours) at 03/13/2022 1038 Last data filed at 03/13/2022 0656 Gross per 24 hour  Intake 2798.56 ml  Output 1900 ml  Net 898.56 ml     Assessment/Plan:  72 y.o. male is s/p right carotid stent placement for high-grade right carotid stenosis.  This morning on evaluation I find the patient resting comfortably in bed with his wife at the bedside.  He has no complaints.  Dopamine drip was stopped at some point during the night but was restarted this morning for lower blood pressures and heart rate in the 50s.  Patient does not have any stroke symptoms to note.  And answers all questions appropriately.  No vision changes.  No chest pain or shortness of breath.  1 Day Post-Op    Plan: Plan is to watch the patient throughout the day wean him off of dopamine drip.  Monitor his blood pressures as well as his heart rate.  We will not reinstitute any of his home blood pressure medications at this point in time.  Foley catheter was removed without complication.  The device to his right groin was removed and replaced with a clean dry gauze.  If the patient is eating well ambulating well and blood pressure remained stable with a MAP greater than 65 and heart rates stay above 50 patient may be able to be discharged later today.  At this time the patient will need 1 more day in the hospital.  Will reevaluate this afternoon. DVT prophylaxis:  ASA, Eloquis   Karra Pink R [facility] 03/13/2022 10:38 AM

## 2022-03-13 NOTE — Progress Notes (Signed)
[id number] patient alert x4 able to make all needs known call light in reach wife at bedside

## 2022-03-13 NOTE — Plan of Care (Signed)

## 2022-03-13 NOTE — TOC Initial Note (Signed)
Transition of Care [facility]) - Initial/Assessment Note    Patient Details  Name: Samuel [name] MRN: [id number] Date of Birth: 10-19-1950  Transition of Care [facility]) CM/SW Contact:    Shelbie Hutching, RN Phone Number: 03/13/2022, 10:36 AM  Clinical Narrative:                  Transition of Care [facility]) Screening Note   Patient Details  Name: Samuel [name] Date of Birth: 1951-01-22   Transition of Care [facility]) CM/SW Contact:    Shelbie Hutching, RN Phone Number: 03/13/2022, 10:36 AM    Transition of Care Department [facility]) has reviewed patient and no TOC needs have been identified at this time. We will continue to monitor patient advancement through interdisciplinary progression rounds. If new patient transition needs arise, please place a TOC consult.          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 12/12/2021   S/P CABG x 3 11/24/2021   Carotid artery stenosis, asymptomatic, bilateral 11/23/2021   Acute myocardial infarction (non-STEMI) involving left main coronary artery ([place]) 11/22/2021   Coronary artery disease involving native coronary artery of native heart with unstable angina pectoris ([place]) 11/22/2021   AKI (acute kidney injury) ([place]) 11/21/2021   Uncontrolled type 2 diabetes mellitus with hyperglycemia, without long-term current use of insulin ([place]) 11/21/2021   Dyslipidemia 11/21/2021   Essential  hypertension 11/21/2021   GERD without esophagitis 11/21/2021   NSTEMI (non-ST elevated myocardial infarction) ([place]) 11/20/2021   Status post total left knee replacement 05/10/2019   Primary osteoarthritis of right knee 11/25/2018   Health care maintenance 01/26/2015   Diabetes mellitus with stage 2 chronic kidney disease ([place]) 06/06/2013   Dyslipidemia associated with type 2 diabetes mellitus ([place]) 06/06/2013   Hypertension 06/06/2013   OSA on CPAP 06/06/2013   PCP:  Kirk Ruths, MD Pharmacy:   [facility], [address] [place] [address] Phone: [phone] Fax: [phone]     Social Determinants of Health (SDOH) Social History: SDOH Screenings   Food Insecurity: No Food Insecurity (11/22/2021)  Housing: Low Risk  (11/22/2021)  Transportation Needs: No Transportation Needs (11/22/2021)  Utilities: Not At Risk (11/22/2021)  Depression (PHQ2-9): Low Risk  (03/05/2022)  Tobacco Use: Low Risk  (03/13/2022)   SDOH Interventions:     Readmission Risk Interventions     No data to display

## 2022-03-14 NOTE — Discharge Summary (Signed)
[facility]    Discharge Summary    Patient ID:  Samuel [name] MRN: [id number] DOB/AGE: May 27, 1950 72 y.o.  Admit date: 03/12/2022 Discharge date: 03/14/2022 Date of Surgery: 03/12/2022 Surgeon: Surgeon(s): Lucky Cowboy Erskine Squibb, MD  Admission Diagnosis: Carotid stenosis, right [I65.21]  Discharge Diagnoses:  Carotid stenosis, right [I65.21]  Secondary Diagnoses: Past Medical History:  Diagnosis Date   Anxiety    situational   Arthritis    Cancer ([place])    squamous cell carcinoma   ED (erectile dysfunction)    GERD (gastroesophageal reflux disease)    Hyperlipidemia    Hypertension    OSA (obstructive sleep apnea)    CPAP   Stroke ([place])    no residual    Uncontrolled type 2 diabetes mellitus with hyperglycemia, with long-term current use of insulin ([facility])     Procedure(s): CAROTID PTA/STENT INTERVENTION  Discharged Condition: good  HPI:  [name] is a 72 year old male who is now status post 1 day right carotid stent placement for high-grade right carotid stenosis.  Immediately after the procedure patient was taken to ICU and a dopamine drip was started for low blood pressure and low heart rate.   This morning on examination I find the patient resting comfortably in bed with his wife at the bedside. Dopamine has been stopped.  No complications overnight patient remained stable. Patient to be discharged home today.   Hospital Course:  Samuel [name] is a 72 y.o. male is S/P Neither Extubated: POD # 0 Physical Exam:  Alert notes x3, no acute distress Face: Symmetrical.  Tongue is midline. Neck: Trachea is midline.  No swelling or bruising. Cardiovascular: Regular rate and rhythm Pulmonary: Clear to auscultation bilaterally Abdomen: Soft, nontender, nondistended Right groin access: Clean dry and intact.  No swelling or drainage noted Left groin access: Clean dry and intact.  No swelling or drainage noted Left lower extremity: Thigh soft.   Calf soft.  Extremities warm distally toes.  Hard to palpate pedal pulses however the foot is warm is her good capillary refill. Right lower extremity: Thigh soft.  Calf soft.  Extremities warm distally toes.  Hard to palpate pedal pulses however the foot is warm is her good capillary refill. Neurological: No deficits noted; No stroke symptoms   Post-op wounds:  clean, dry, intact or healing well  Pt. Ambulating, voiding and taking PO diet without difficulty. Pt pain controlled with PO pain meds.  Labs:  As below  Complications: none  Consults:    Significant Diagnostic Studies: CBC Lab Results  Component Value Date   WBC 10.8 (H) 03/13/2022   HGB 12.1 (L) 03/13/2022   HCT 36.2 (L) 03/13/2022   MCV 85.0 03/13/2022   PLT 201 03/13/2022    BMET    Component Value Date/Time   NA 137 03/13/2022 0320   K 3.7 03/13/2022 0320   CL 107 03/13/2022 0320   CO2 21 (L) 03/13/2022 0320   GLUCOSE 129 (H) 03/13/2022 0320   BUN 22 03/13/2022 0320   CREATININE 1.02 03/13/2022 0320   CALCIUM 8.8 (L) 03/13/2022 0320   GFRNONAA >60 03/13/2022 0320   GFRAA >60 11/03/2019 1402   COAG Lab Results  Component Value Date   INR 1.2 11/24/2021   INR 1.1 11/21/2021   INR 1.1 11/20/2021     Disposition:  Discharge to :Home  Allergies as of 03/14/2022   No Known Allergies      Medication List     TAKE  these medications    acetaminophen 500 MG tablet Commonly known as: TYLENOL Take 1-2 tablets (500-1,000 mg total) by mouth every 6 (six) hours as needed.   amLODipine 5 MG tablet Commonly known as: NORVASC Take 5 mg by mouth daily.   aspirin EC 81 MG tablet Take 81 mg by mouth daily.   atorvastatin 80 MG tablet Commonly known as: Lipitor Take 1 tablet (80 mg total) by mouth daily.   clopidogrel 75 MG tablet Commonly known as: Plavix Take 1 tablet (75 mg total) by mouth daily.   lisinopril-hydrochlorothiazide 20-25 MG tablet Commonly known as: ZESTORETIC Take 1  tablet by mouth daily.   loratadine 10 MG tablet Commonly known as: CLARITIN Take 10 mg by mouth daily.   metFORMIN 500 MG tablet Commonly known as: GLUCOPHAGE Take 1 tablet (500 mg total) by mouth 2 (two) times daily with a meal.   metoprolol tartrate 25 MG tablet Commonly known as: LOPRESSOR Take 0.5 tablets (12.5 mg total) by mouth 2 (two) times daily.   omeprazole 20 MG capsule Commonly known as: PRILOSEC Take 20 mg by mouth daily before breakfast.   OneTouch Ultra test strip Generic drug: glucose blood CHECK BLOOD SUGAR TWICE DAILY.   oxybutynin 5 MG 24 hr tablet Commonly known as: DITROPAN-XL Take 5 mg by mouth daily.       Verbal and written Discharge instructions given to the patient. Wound care per Discharge AVS  Follow-up Information     Dew, Erskine Squibb, MD Follow up.   Specialties: Vascular Surgery, Radiology, Interventional Cardiology Contact information: [place] [address] [phone]                 Signed: Drema Pry, NP  03/14/2022, 8:03 AM

## 2022-03-20 ENCOUNTER — Ambulatory Visit (INDEPENDENT_AMBULATORY_CARE_PROVIDER_SITE_OTHER): Payer: Medicare Other | Admitting: Vascular Surgery

## 2022-04-10 ENCOUNTER — Other Ambulatory Visit (INDEPENDENT_AMBULATORY_CARE_PROVIDER_SITE_OTHER): Payer: Self-pay | Admitting: Vascular Surgery

## 2022-04-10 DIAGNOSIS — I6521 Occlusion and stenosis of right carotid artery: Secondary | ICD-10-CM

## 2022-04-11 ENCOUNTER — Ambulatory Visit (INDEPENDENT_AMBULATORY_CARE_PROVIDER_SITE_OTHER): Payer: Medicare Other

## 2022-04-11 ENCOUNTER — Ambulatory Visit (INDEPENDENT_AMBULATORY_CARE_PROVIDER_SITE_OTHER): Payer: Medicare Other | Admitting: Nurse Practitioner

## 2022-04-11 ENCOUNTER — Encounter (INDEPENDENT_AMBULATORY_CARE_PROVIDER_SITE_OTHER): Payer: Self-pay | Admitting: Nurse Practitioner

## 2022-04-11 VITALS — BP 134/84 | HR 84 | Resp 18 | Ht 66.0 in | Wt 212.0 lb

## 2022-04-11 DIAGNOSIS — I10 Essential (primary) hypertension: Secondary | ICD-10-CM

## 2022-04-11 DIAGNOSIS — I6521 Occlusion and stenosis of right carotid artery: Secondary | ICD-10-CM | POA: Diagnosis not present

## 2022-04-11 DIAGNOSIS — I6523 Occlusion and stenosis of bilateral carotid arteries: Secondary | ICD-10-CM

## 2022-04-11 DIAGNOSIS — E1122 Type 2 diabetes mellitus with diabetic chronic kidney disease: Secondary | ICD-10-CM

## 2022-04-11 DIAGNOSIS — N182 Chronic kidney disease, stage 2 (mild): Secondary | ICD-10-CM

## 2022-05-06 ENCOUNTER — Encounter (INDEPENDENT_AMBULATORY_CARE_PROVIDER_SITE_OTHER): Payer: Self-pay | Admitting: Nurse Practitioner

## 2022-05-06 NOTE — Progress Notes (Signed)
Subjective:    Patient ID: Samuel [name], male    DOB: [birth year], 72 y.o.   MRN: [id number] Chief Complaint  Patient presents with   Follow-up    Ultrasound follow up     The patient is seen for follow up evaluation of carotid stenosis status post right carotid stenting on 03/12/2022.  There were no post operative problems or complications related to the surgery.  The patient denies neck or incisional pain.  The patient denies interval amaurosis fugax. There is no recent history of TIA symptoms or focal motor deficits. There is no prior documented CVA.  The patient denies headache.  The patient is taking enteric-coated aspirin 81 mg daily.  No recent shortening of the patient's walking distance or new symptoms consistent with claudication.  No history of rest pain symptoms. No new ulcers or wounds of the lower extremities have occurred.  There is no history of DVT, PE or superficial thrombophlebitis. No recent episodes of angina or shortness of breath documented.   The patient has a patent stent noted in the right ICA with minimal stenosis.  The left carotid has a 60 to 79% stenosis.    Review of Systems  All other systems reviewed and are negative.      Objective:   Physical Exam Vitals reviewed.  HENT:     Head: Normocephalic.  Neck:     Vascular: No carotid bruit.  Cardiovascular:     Rate and Rhythm: Normal rate.     Pulses:          Radial pulses are 2+ on the right side and 2+ on the left side.  Pulmonary:     Effort: Pulmonary effort is normal.  Skin:    General: Skin is warm and dry.  Neurological:     Mental Status: He is alert and oriented to person, place, and time.  Psychiatric:        Mood and Affect: Mood normal.        Behavior: Behavior normal.        Thought Content: Thought content normal.        Judgment: Judgment normal.     BP 134/84 (BP Location: Right Arm)   Pulse 84   Resp 18   Ht '5\' 6"'$  (1.676 m)   Wt 212 lb (96.2 kg)   BMI 34.22  kg/m   Past Medical History:  Diagnosis Date   Anxiety    situational   Arthritis    Cancer ([place])    squamous cell carcinoma   ED (erectile dysfunction)    GERD (gastroesophageal reflux disease)    Hyperlipidemia    Hypertension    OSA (obstructive sleep apnea)    CPAP   Stroke ([place])    no residual    Uncontrolled type 2 diabetes mellitus with hyperglycemia, with long-term current use of insulin (HCC)     Social History   Socioeconomic History   Marital status: Married    Spouse name: Samuel [name]   Number of children: 3   Years of education: 12   Highest education level: Some college, no degree  Occupational History   Not on file  Tobacco Use   Smoking status: Never   Smokeless tobacco: Never  Vaping Use   Vaping Use: Never used  Substance and Sexual Activity   Alcohol use: Yes    Alcohol/week: 2.0 standard drinks of alcohol    Types: 2 Standard drinks or equivalent per week    Comment: occasion  at this point   Drug use: Never   Sexual activity: Not Currently    Birth control/protection: Post-menopausal  Other Topics Concern   Not on file  Social History Narrative   Lives at home with wife.    Social Determinants of Health   Financial Resource Strain: Not on file  Food Insecurity: No Food Insecurity (11/22/2021)   Hunger Vital Sign    Worried About Running Out of Food in the Last Year: Never true    Ran Out of Food in the Last Year: Never true  Transportation Needs: No Transportation Needs (11/22/2021)   PRAPARE - Hydrologist (Medical): No    Lack of Transportation (Non-Medical): No  Physical Activity: Not on file  Stress: Not on file  Social Connections: Not on file  Intimate Partner Violence: Not At Risk (11/22/2021)   Humiliation, Afraid, Rape, and Kick questionnaire    Fear of Current or Ex-Partner: No    Emotionally Abused: No    Physically Abused: No    Sexually Abused: No    Past Surgical History:  Procedure  Laterality Date   CARDIAC CATHETERIZATION     CAROTID PTA/STENT INTERVENTION Right 03/12/2022   Procedure: CAROTID PTA/STENT INTERVENTION;  Surgeon: Algernon Huxley, MD;  Location: [facility];  Service: Cardiovascular;  Laterality: Right;   COLONOSCOPY     CORONARY ARTERY BYPASS GRAFT N/A 11/24/2021   Procedure: CORONARY ARTERY BYPASS GRAFTING (CABG) X3 BYPASSES, USING OPEN LEFT INTERNAL MAMMARY ARTERY AND ENDOSCOPIC RIGHT GREATER SAPHENOUS VEIN HARVEST;  Surgeon: Coralie Common, MD;  Location: [facility];  Service: Open Heart Surgery;  Laterality: N/A;   EXPLORATION POST OPERATIVE OPEN HEART  11/24/2021   Dr. Reinaldo Berber : [facility] in [place] Left 03/20/2019   Procedure: LEFT COMPUTER ASSISTED TOTAL KNEE ARTHROPLASTY;  Surgeon: Dereck Leep, MD;  Location: [facility];  Service: Orthopedics;  Laterality: Left;   KNEE ARTHROPLASTY Right 11/09/2019   Procedure: COMPUTER ASSISTED TOTAL KNEE ARTHROPLASTY;  Surgeon: Dereck Leep, MD;  Location: [facility];  Service: Orthopedics;  Laterality: Right;   KNEE ARTHROSCOPY Left    LEFT HEART CATH N/A 11/21/2021   Procedure: Left Heart Cath;  Surgeon: Isaias Cowman, MD;  Location: [facility];  Service: Cardiovascular;  Laterality: N/A;   SHOULDER ARTHROSCOPY Right 2009   rotator cuff   TEE WITHOUT CARDIOVERSION N/A 11/24/2021   Procedure: TRANSESOPHAGEAL ECHOCARDIOGRAM (TEE);  Surgeon: Coralie Common, MD;  Location: [facility];  Service: Open Heart Surgery;  Laterality: N/A;   TONSILLECTOMY      Family History  Problem Relation Age of Onset   CAD Sister    CAD Brother     No Known Allergies     Latest Ref Rng & Units 03/13/2022    3:20 AM 11/28/2021    3:05 AM 11/27/2021    6:02 AM  CBC  WBC 4.0 - 10.5 K/uL 10.8  10.1  10.8   Hemoglobin 13.0 - 17.0 g/dL 12.1  9.4  9.0   Hematocrit 39.0 - 52.0 % 36.2  28.1  26.7   Platelets 150 - 400 K/uL 201  255  200       CMP     Component Value Date/Time   NA  137 03/13/2022 0320   K 3.7 03/13/2022 0320   CL 107 03/13/2022 0320   CO2 21 (L) 03/13/2022 0320   GLUCOSE 129 (H) 03/13/2022 0320   BUN 22 03/13/2022 0320  CREATININE 1.02 03/13/2022 0320   CALCIUM 8.8 (L) 03/13/2022 0320   PROT 7.9 11/20/2021 1956   ALBUMIN 3.9 11/20/2021 1956   AST 27 11/20/2021 1956   ALT 17 11/20/2021 1956   ALKPHOS 65 11/20/2021 1956   BILITOT 0.6 11/20/2021 1956   GFRNONAA >60 03/13/2022 0320   GFRAA >60 11/03/2019 1402     No results found.     Assessment & Plan:   1. Carotid artery stenosis, asymptomatic, bilateral Recommend:  The patient is s/p successful right carotid stent  Duplex ultrasound  shows a widely patent right ICA.Marland Kitchen  Continue antiplatelet therapy as prescribed Continue management of CAD, HTN and Hyperlipidemia Healthy heart diet,  encouraged exercise at least 4 times per week  The patient's NIHSS score is as follows: 1 Mild: 1 - 5 Mild to Moderately Severe: 5 - 14 Severe: 15 - 24 Very Severe: >25  Follow up in 3 months with duplex ultrasound and physical exam based on the patient's carotid intervention.  2. Essential hypertension Continue antihypertensive medications as already ordered, these medications have been reviewed and there are no changes at this time.  3. Diabetes mellitus with stage 2 chronic kidney disease ([place]) Continue hypoglycemic medications as already ordered, these medications have been reviewed and there are no changes at this time.  Hgb A1C to be monitored as already arranged by primary service   Current Outpatient Medications on File Prior to Visit  Medication Sig Dispense Refill   acetaminophen (TYLENOL) 500 MG tablet Take 1-2 tablets (500-1,000 mg total) by mouth every 6 (six) hours as needed. 30 tablet 0   aspirin 81 MG EC tablet Take 81 mg by mouth daily.      atorvastatin (LIPITOR) 80 MG tablet Take 1 tablet (80 mg total) by mouth daily. 30 tablet 0   clopidogrel (PLAVIX) 75 MG tablet Take 1  tablet (75 mg total) by mouth daily. 30 tablet 11   lisinopril (ZESTRIL) 5 MG tablet Take by mouth.     loratadine (CLARITIN) 10 MG tablet Take 10 mg by mouth daily.     metFORMIN (GLUCOPHAGE) 500 MG tablet Take 1 tablet (500 mg total) by mouth 2 (two) times daily with a meal.     metoprolol tartrate (LOPRESSOR) 25 MG tablet Take 0.5 tablets (12.5 mg total) by mouth 2 (two) times daily. 60 tablet 3   omeprazole (PRILOSEC) 20 MG capsule Take 20 mg by mouth daily before breakfast.     ONETOUCH ULTRA test strip CHECK BLOOD SUGAR TWICE DAILY.     oxybutynin (DITROPAN-XL) 5 MG 24 hr tablet Take 5 mg by mouth daily.     amLODipine (NORVASC) 5 MG tablet Take 5 mg by mouth daily.     lisinopril-hydrochlorothiazide (ZESTORETIC) 20-25 MG tablet Take 1 tablet by mouth daily.     No current facility-administered medications on file prior to visit.    There are no Patient Instructions on file for this visit. No follow-ups on file.   Kris Hartmann, NP

## 2022-07-11 ENCOUNTER — Other Ambulatory Visit (INDEPENDENT_AMBULATORY_CARE_PROVIDER_SITE_OTHER): Payer: Self-pay | Admitting: Nurse Practitioner

## 2022-07-11 DIAGNOSIS — I6523 Occlusion and stenosis of bilateral carotid arteries: Secondary | ICD-10-CM

## 2022-07-13 ENCOUNTER — Encounter (INDEPENDENT_AMBULATORY_CARE_PROVIDER_SITE_OTHER): Payer: Self-pay | Admitting: Nurse Practitioner

## 2022-07-13 ENCOUNTER — Ambulatory Visit (INDEPENDENT_AMBULATORY_CARE_PROVIDER_SITE_OTHER): Payer: Medicare Other

## 2022-07-13 ENCOUNTER — Ambulatory Visit (INDEPENDENT_AMBULATORY_CARE_PROVIDER_SITE_OTHER): Payer: Medicare Other | Admitting: Nurse Practitioner

## 2022-07-13 VITALS — BP 112/69 | HR 60 | Resp 16 | Wt 210.0 lb

## 2022-07-13 DIAGNOSIS — E1122 Type 2 diabetes mellitus with diabetic chronic kidney disease: Secondary | ICD-10-CM

## 2022-07-13 DIAGNOSIS — N182 Chronic kidney disease, stage 2 (mild): Secondary | ICD-10-CM

## 2022-07-13 DIAGNOSIS — I6523 Occlusion and stenosis of bilateral carotid arteries: Secondary | ICD-10-CM

## 2022-07-13 DIAGNOSIS — I700 Atherosclerosis of aorta: Secondary | ICD-10-CM | POA: Diagnosis not present

## 2022-07-13 DIAGNOSIS — I10 Essential (primary) hypertension: Secondary | ICD-10-CM

## 2022-07-31 ENCOUNTER — Encounter (INDEPENDENT_AMBULATORY_CARE_PROVIDER_SITE_OTHER): Payer: Self-pay | Admitting: Nurse Practitioner

## 2022-07-31 NOTE — Progress Notes (Signed)
Subjective:    Patient ID: Samuel [name], male    DOB: Jul 12, 1950, 72 y.o.   MRN: [id number] Chief Complaint  Patient presents with   Follow-up    Ultrasound follow up    The patient is seen for follow up evaluation of carotid stenosis. The carotid stenosis followed by ultrasound.   The patient denies amaurosis fugax. There is no recent history of TIA symptoms or focal motor deficits. There is no prior documented CVA.  The patient is taking enteric-coated aspirin 81 mg daily.  There is no history of migraine headaches. There is no history of seizures.  No recent shortening of the patient's walking distance or new symptoms consistent with claudication.  No history of rest pain symptoms. No new ulcers or wounds of the lower extremities have occurred.  There is no history of DVT, PE or superficial thrombophlebitis. No documented recent episodes of angina or shortness of breath documented.   Carotid Duplex done today shows 1 to 39% stenosis of the right ICA.  Stent is patent.  40 to 59% stenosis of the left ICA noted to be at the high end.  Previous study showed the patient in the 60 to 79% range of the ICA.    Review of Systems  All other systems reviewed and are negative.      Objective:   Physical Exam Vitals reviewed.  HENT:     Head: Normocephalic.  Neck:     Vascular: No carotid bruit.  Cardiovascular:     Rate and Rhythm: Normal rate.     Pulses:          Radial pulses are 2+ on the right side and 2+ on the left side.  Pulmonary:     Effort: Pulmonary effort is normal.  Skin:    General: Skin is warm and dry.  Neurological:     Mental Status: He is alert and oriented to person, place, and time.  Psychiatric:        Mood and Affect: Mood normal.        Behavior: Behavior normal.        Thought Content: Thought content normal.        Judgment: Judgment normal.     BP 112/69 (BP Location: Right Arm)   Pulse 60   Resp 16   Wt 210 lb (95.3 kg)   BMI 33.89  kg/m   Past Medical History:  Diagnosis Date   Anxiety    situational   Arthritis    Cancer (HCC)    squamous cell carcinoma   ED (erectile dysfunction)    GERD (gastroesophageal reflux disease)    Hyperlipidemia    Hypertension    OSA (obstructive sleep apnea)    CPAP   Stroke (HCC)    no residual    Uncontrolled type 2 diabetes mellitus with hyperglycemia, with long-term current use of insulin (HCC)     Social History   Socioeconomic History   Marital status: Married    Spouse name: Rosey Bath   Number of children: 3   Years of education: 12   Highest education level: Some college, no degree  Occupational History   Not on file  Tobacco Use   Smoking status: Never   Smokeless tobacco: Never  Vaping Use   Vaping Use: Never used  Substance and Sexual Activity   Alcohol use: Yes    Alcohol/week: 2.0 standard drinks of alcohol    Types: 2 Standard drinks or equivalent per week  Comment: occasion at this point   Drug use: Never   Sexual activity: Not Currently    Birth control/protection: Post-menopausal  Other Topics Concern   Not on file  Social History Narrative   Lives at home with wife.    Social Determinants of Health   Financial Resource Strain: Not on file  Food Insecurity: No Food Insecurity (11/22/2021)   Hunger Vital Sign    Worried About Running Out of Food in the Last Year: Never true    Ran Out of Food in the Last Year: Never true  Transportation Needs: No Transportation Needs (11/22/2021)   PRAPARE - Administrator, Civil Service (Medical): No    Lack of Transportation (Non-Medical): No  Physical Activity: Not on file  Stress: Not on file  Social Connections: Not on file  Intimate Partner Violence: Not At Risk (11/22/2021)   Humiliation, Afraid, Rape, and Kick questionnaire    Fear of Current or Ex-Partner: No    Emotionally Abused: No    Physically Abused: No    Sexually Abused: No    Past Surgical History:  Procedure  Laterality Date   CARDIAC CATHETERIZATION     CAROTID PTA/STENT INTERVENTION Right 03/12/2022   Procedure: CAROTID PTA/STENT INTERVENTION;  Surgeon: Annice Needy, MD;  Location: [facility];  Service: Cardiovascular;  Laterality: Right;   COLONOSCOPY     CORONARY ARTERY BYPASS GRAFT N/A 11/24/2021   Procedure: CORONARY ARTERY BYPASS GRAFTING (CABG) X3 BYPASSES, USING OPEN LEFT INTERNAL MAMMARY ARTERY AND ENDOSCOPIC RIGHT GREATER SAPHENOUS VEIN HARVEST;  Surgeon: Eugenio Hoes, MD;  Location[facility];  Service: Open Heart Surgery;  Laterality: N/A;   EXPLORATION POST OPERATIVE OPEN HEART  11/24/2021   Dr. Nevin Bloodgood : [facility] in [place]   KNEE ARTHROPLASTY Left 03/20/2019   Procedure: LEFT COMPUTER ASSISTED TOTAL KNEE ARTHROPLASTY;  Surgeon: Donato Heinz, MD;  Location: [facility];  Service: Orthopedics;  Laterality: Left;   KNEE ARTHROPLASTY Right 11/09/2019   Procedure: COMPUTER ASSISTED TOTAL KNEE ARTHROPLASTY;  Surgeon: Donato Heinz, MD;  Location: [facility];  Service: Orthopedics;  Laterality: Right;   KNEE ARTHROSCOPY Left    LEFT HEART CATH N/A 11/21/2021   Procedure: Left Heart Cath;  Surgeon: Marcina Millard, MD;  Location: [facility];  Service: Cardiovascular;  Laterality: N/A;   SHOULDER ARTHROSCOPY Right 2009   rotator cuff   TEE WITHOUT CARDIOVERSION N/A 11/24/2021   Procedure: TRANSESOPHAGEAL ECHOCARDIOGRAM (TEE);  Surgeon: Eugenio Hoes, MD;  Location: [facility];  Service: Open Heart Surgery;  Laterality: N/A;   TONSILLECTOMY      Family History  Problem Relation Age of Onset   CAD Sister    CAD Brother     No Known Allergies     Latest Ref Rng & Units 03/13/2022    3:20 AM 11/28/2021    3:05 AM 11/27/2021    6:02 AM  CBC  WBC 4.0 - 10.5 K/uL 10.8  10.1  10.8   Hemoglobin 13.0 - 17.0 g/dL 16.1  9.4  9.0   Hematocrit 39.0 - 52.0 % 36.2  28.1  26.7   Platelets 150 - 400 K/uL 201  255  200       CMP     Component Value Date/Time   NA  137 03/13/2022 0320   K 3.7 03/13/2022 0320   CL 107 03/13/2022 0320   CO2 21 (L) 03/13/2022 0320   GLUCOSE 129 (H) 03/13/2022 0320   BUN 22  03/13/2022 0320   CREATININE 1.02 03/13/2022 0320   CALCIUM 8.8 (L) 03/13/2022 0320   PROT 7.9 11/20/2021 1956   ALBUMIN 3.9 11/20/2021 1956   AST 27 11/20/2021 1956   ALT 17 11/20/2021 1956   ALKPHOS 65 11/20/2021 1956   BILITOT 0.6 11/20/2021 1956   GFRNONAA >60 03/13/2022 0320     No results found.     Assessment & Plan:   1. Carotid artery stenosis, asymptomatic, bilateral Recommend:  Given the patient's asymptomatic subcritical stenosis no further invasive testing or surgery at this time.  Duplex ultrasound shows 1 to 39% stenosis of the right ICA with 40 to 59% stenosis of the left ICA.  This stenosis is nearly higher end.  Continue antiplatelet therapy as prescribed Continue management of CAD, HTN and Hyperlipidemia Healthy heart diet,  encouraged exercise at least 4 times per week Follow up in 6 months with duplex ultrasound and physical exam   2. Diabetes mellitus with stage 2 chronic kidney disease (HCC) Continue hypoglycemic medications as already ordered, these medications have been reviewed and there are no changes at this time.  Hgb A1C to be monitored as already arranged by primary service  3. Essential hypertension Continue antihypertensive medications as already ordered, these medications have been reviewed and there are no changes at this time.  4. Aortic atherosclerosis (HCC) The patient has no evidence of an abdominal aortic aneurysm.  He has known aortic atherosclerosis which was present today.  However it is not significant.  We can follow this every 2 years or sooner if issues arise.   Current Outpatient Medications on File Prior to Visit  Medication Sig Dispense Refill   acetaminophen (TYLENOL) 500 MG tablet Take 1-2 tablets (500-1,000 mg total) by mouth every 6 (six) hours as needed. 30 tablet 0    aspirin 81 MG EC tablet Take 81 mg by mouth daily.      atorvastatin (LIPITOR) 80 MG tablet Take 1 tablet (80 mg total) by mouth daily. 30 tablet 0   clopidogrel (PLAVIX) 75 MG tablet Take 1 tablet (75 mg total) by mouth daily. 30 tablet 11   lisinopril (ZESTRIL) 5 MG tablet Take by mouth.     loratadine (CLARITIN) 10 MG tablet Take 10 mg by mouth daily.     metFORMIN (GLUCOPHAGE) 500 MG tablet Take 1 tablet (500 mg total) by mouth 2 (two) times daily with a meal. (Patient taking differently: Take 500 mg by mouth daily with breakfast.)     metoprolol tartrate (LOPRESSOR) 25 MG tablet Take 0.5 tablets (12.5 mg total) by mouth 2 (two) times daily. 60 tablet 3   omeprazole (PRILOSEC) 20 MG capsule Take 20 mg by mouth daily before breakfast.     ONETOUCH ULTRA test strip CHECK BLOOD SUGAR TWICE DAILY.     oxybutynin (DITROPAN-XL) 5 MG 24 hr tablet Take 5 mg by mouth daily.     amLODipine (NORVASC) 5 MG tablet Take 5 mg by mouth daily.     lisinopril-hydrochlorothiazide (ZESTORETIC) 20-25 MG tablet Take 1 tablet by mouth daily.     No current facility-administered medications on file prior to visit.    There are no Patient Instructions on file for this visit. No follow-ups on file.   Georgiana Spinner, NP

## 2022-11-30 ENCOUNTER — Other Ambulatory Visit: Payer: Self-pay | Admitting: Physician Assistant

## 2023-01-07 ENCOUNTER — Other Ambulatory Visit (INDEPENDENT_AMBULATORY_CARE_PROVIDER_SITE_OTHER): Payer: Self-pay | Admitting: Nurse Practitioner

## 2023-01-07 DIAGNOSIS — I6523 Occlusion and stenosis of bilateral carotid arteries: Secondary | ICD-10-CM

## 2023-01-09 ENCOUNTER — Encounter (INDEPENDENT_AMBULATORY_CARE_PROVIDER_SITE_OTHER): Payer: Self-pay | Admitting: Nurse Practitioner

## 2023-01-09 ENCOUNTER — Ambulatory Visit (INDEPENDENT_AMBULATORY_CARE_PROVIDER_SITE_OTHER): Payer: Medicare Other

## 2023-01-09 ENCOUNTER — Ambulatory Visit (INDEPENDENT_AMBULATORY_CARE_PROVIDER_SITE_OTHER): Payer: Medicare Other | Admitting: Nurse Practitioner

## 2023-01-09 VITALS — BP 108/72 | HR 57 | Resp 16 | Wt 213.0 lb

## 2023-01-09 DIAGNOSIS — I10 Essential (primary) hypertension: Secondary | ICD-10-CM | POA: Diagnosis not present

## 2023-01-09 DIAGNOSIS — N182 Chronic kidney disease, stage 2 (mild): Secondary | ICD-10-CM

## 2023-01-09 DIAGNOSIS — I700 Atherosclerosis of aorta: Secondary | ICD-10-CM | POA: Diagnosis not present

## 2023-01-09 DIAGNOSIS — I6523 Occlusion and stenosis of bilateral carotid arteries: Secondary | ICD-10-CM | POA: Diagnosis not present

## 2023-01-09 DIAGNOSIS — E1122 Type 2 diabetes mellitus with diabetic chronic kidney disease: Secondary | ICD-10-CM | POA: Diagnosis not present

## 2023-01-13 ENCOUNTER — Encounter (INDEPENDENT_AMBULATORY_CARE_PROVIDER_SITE_OTHER): Payer: Self-pay | Admitting: Nurse Practitioner

## 2023-01-13 NOTE — Progress Notes (Signed)
Subjective:    Patient ID: Samuel [name], male    DOB: February 27, 1950, 72 y.o.   MRN: [id number] Chief Complaint  Patient presents with   Follow-up    6 month carotid follow up    The patient is seen for follow up evaluation of carotid stenosis. The carotid stenosis followed by ultrasound.   The patient denies amaurosis fugax. There is no recent history of TIA symptoms or focal motor deficits. There is no prior documented CVA.  The patient is taking enteric-coated aspirin 81 mg daily.  There is no history of migraine headaches. There is no history of seizures.  No recent shortening of the patient's walking distance or new symptoms consistent with claudication.  No history of rest pain symptoms. No new ulcers or wounds of the lower extremities have occurred.  There is no history of DVT, PE or superficial thrombophlebitis. No documented recent episodes of angina or shortness of breath documented.   Carotid Duplex done today shows 40-59 % stenosis of the right ICA.  Stent is patent.  It was previously 1 to 39% stenosis however there is only a slight change in velocities.  40 to 59% stenosis of the left ICA noted to be at the high end.  Previous study showed the patient in the 60 to 79% range of the ICA.    Review of Systems  All other systems reviewed and are negative.      Objective:   Physical Exam Vitals reviewed.  HENT:     Head: Normocephalic.  Neck:     Vascular: No carotid bruit.  Cardiovascular:     Rate and Rhythm: Normal rate.     Pulses:          Radial pulses are 2+ on the right side and 2+ on the left side.  Pulmonary:     Effort: Pulmonary effort is normal.  Skin:    General: Skin is warm and dry.  Neurological:     Mental Status: He is alert and oriented to person, place, and time.  Psychiatric:        Mood and Affect: Mood normal.        Behavior: Behavior normal.        Thought Content: Thought content normal.        Judgment: Judgment normal.     BP  108/72 (BP Location: Right Arm)   Pulse (!) 57   Resp 16   Wt 213 lb (96.6 kg)   BMI 34.38 kg/m   Past Medical History:  Diagnosis Date   Anxiety    situational   Arthritis    Cancer (HCC)    squamous cell carcinoma   ED (erectile dysfunction)    GERD (gastroesophageal reflux disease)    Hyperlipidemia    Hypertension    OSA (obstructive sleep apnea)    CPAP   Stroke (HCC)    no residual    Uncontrolled type 2 diabetes mellitus with hyperglycemia, with long-term current use of insulin (HCC)     Social History   Socioeconomic History   Marital status: Married    Spouse name: Rosey Bath   Number of children: 3   Years of education: 12   Highest education level: Some college, no degree  Occupational History   Not on file  Tobacco Use   Smoking status: Never   Smokeless tobacco: Never  Vaping Use   Vaping status: Never Used  Substance and Sexual Activity   Alcohol use: Yes  Alcohol/week: 2.0 standard drinks of alcohol    Types: 2 Standard drinks or equivalent per week    Comment: occasion at this point   Drug use: Never   Sexual activity: Not Currently    Birth control/protection: Post-menopausal  Other Topics Concern   Not on file  Social History Narrative   Lives at home with wife.    Social Determinants of Health   Financial Resource Strain: Not on file  Food Insecurity: No Food Insecurity (11/22/2021)   Hunger Vital Sign    Worried About Running Out of Food in the Last Year: Never true    Ran Out of Food in the Last Year: Never true  Transportation Needs: No Transportation Needs (11/22/2021)   PRAPARE - Administrator, Civil Service (Medical): No    Lack of Transportation (Non-Medical): No  Physical Activity: Not on file  Stress: Not on file  Social Connections: Not on file  Intimate Partner Violence: Not At Risk (11/22/2021)   Humiliation, Afraid, Rape, and Kick questionnaire    Fear of Current or Ex-Partner: No    Emotionally Abused: No     Physically Abused: No    Sexually Abused: No    Past Surgical History:  Procedure Laterality Date   CARDIAC CATHETERIZATION     CAROTID PTA/STENT INTERVENTION Right 03/12/2022   Procedure: CAROTID PTA/STENT INTERVENTION;  Surgeon: Annice Needy, MD;  Location: [facility];  Service: Cardiovascular;  Laterality: Right;   COLONOSCOPY     CORONARY ARTERY BYPASS GRAFT N/A 11/24/2021   Procedure: CORONARY ARTERY BYPASS GRAFTING (CABG) X3 BYPASSES, USING OPEN LEFT INTERNAL MAMMARY ARTERY AND ENDOSCOPIC RIGHT GREATER SAPHENOUS VEIN HARVEST;  Surgeon: Eugenio Hoes, MD;  Location: [facility] OR;  Service: Open Heart Surgery;  Laterality: N/A;   EXPLORATION POST OPERATIVE OPEN HEART  11/24/2021   Dr. Nevin Bloodgood : [facility] in [place]   KNEE ARTHROPLASTY Left 03/20/2019   Procedure: LEFT COMPUTER ASSISTED TOTAL KNEE ARTHROPLASTY;  Surgeon: Donato Heinz, MD;  Location: [facility] ORS;  Service: Orthopedics;  Laterality: Left;   KNEE ARTHROPLASTY Right 11/09/2019   Procedure: COMPUTER ASSISTED TOTAL KNEE ARTHROPLASTY;  Surgeon: Donato Heinz, MD;  Location: [facility] OR[facility];  Service: Orthopedics;  Laterality: Right;   KNEE ARTHROSCOPY Left    LEFT HEART CATH N/A 11/21/2021   Procedure: Left Heart Cath;  Surgeon: Marcina Millard, MD;  Location: [facility];  Service: Cardiovascular;  Laterality: N/A;   SHOULDER ARTHROSCOPY Right 2009   rotator cuff   TEE WITHOUT CARDIOVERSION N/A 11/24/2021   Procedure: TRANSESOPHAGEAL ECHOCARDIOGRAM (TEE);  Surgeon: Eugenio Hoes, MD;  Location: [facility] OR;  Service: Open Heart Surgery;  Laterality: N/A;   TONSILLECTOMY      Family History  Problem Relation Age of Onset   CAD Sister    CAD Brother     No Known Allergies     Latest Ref Rng & Units 03/13/2022    3:20 AM 11/28/2021    3:05 AM 11/27/2021    6:02 AM  CBC  WBC 4.0 - 10.5 K/uL 10.8  10.1  10.8   Hemoglobin 13.0 - 17.0 g/dL 78.2  9.4  9.0   Hematocrit 39.0 - 52.0 % 36.2  28.1  26.7    Platelets 150 - 400 K/uL 201  255  200       CMP     Component Value Date/Time   NA 137 03/13/2022 0320   K 3.7 03/13/2022 0320   CL 107  03/13/2022 0320   CO2 21 (L) 03/13/2022 0320   GLUCOSE 129 (H) 03/13/2022 0320   BUN 22 03/13/2022 0320   CREATININE 1.02 03/13/2022 0320   CALCIUM 8.8 (L) 03/13/2022 0320   PROT 7.9 11/20/2021 1956   ALBUMIN 3.9 11/20/2021 1956   AST 27 11/20/2021 1956   ALT 17 11/20/2021 1956   ALKPHOS 65 11/20/2021 1956   BILITOT 0.6 11/20/2021 1956   GFRNONAA >60 03/13/2022 0320     No results found.     Assessment & Plan:   1. Carotid artery stenosis, asymptomatic, bilateral Recommend:  Given the patient's asymptomatic subcritical stenosis no further invasive testing or surgery at this time.  Duplex ultrasound shows 40 to 59% % stenosis of the right ICA with 40 to 59% stenosis of the left ICA.  The left ICA stenosis is at the high end whereas the right stenosis is near the low end.  While the brackets have changed for the right it is very little difference in velocities.    Continue antiplatelet therapy as prescribed Continue management of CAD, HTN and Hyperlipidemia Healthy heart diet,  encouraged exercise at least 4 times per week Follow up in 6 months with duplex ultrasound and physical exam   2. Diabetes mellitus with stage 2 chronic kidney disease (HCC) Continue hypoglycemic medications as already ordered, these medications have been reviewed and there are no changes at this time.  Hgb A1C to be monitored as already arranged by primary service  3. Essential hypertension Continue antihypertensive medications as already ordered, these medications have been reviewed and there are no changes at this time.  4. Aortic atherosclerosis (HCC) The patient has no evidence of an abdominal aortic aneurysm.  He has known aortic atherosclerosis which was present today.  However it is not significant.  We can follow this every 2 years or sooner if  issues arise.  It was last checked in May 2024   Current Outpatient Medications on File Prior to Visit  Medication Sig Dispense Refill   acetaminophen (TYLENOL) 500 MG tablet Take 1-2 tablets (500-1,000 mg total) by mouth every 6 (six) hours as needed. 30 tablet 0   aspirin 81 MG EC tablet Take 81 mg by mouth daily.      clopidogrel (PLAVIX) 75 MG tablet Take 1 tablet by mouth daily.     JARDIANCE 10 MG TABS tablet Take 10 mg by mouth daily.     lisinopril (ZESTRIL) 5 MG tablet Take by mouth.     loratadine (CLARITIN) 10 MG tablet Take 10 mg by mouth daily.     metoprolol tartrate (LOPRESSOR) 25 MG tablet Take 0.5 tablets (12.5 mg total) by mouth 2 (two) times daily. 60 tablet 3   nitroGLYCERIN (NITROSTAT) 0.4 MG SL tablet Place 0.4 mg under the tongue every 5 (five) minutes as needed for chest pain.     omeprazole (PRILOSEC) 20 MG capsule Take 20 mg by mouth daily before breakfast.     ONETOUCH ULTRA test strip CHECK BLOOD SUGAR TWICE DAILY.     oxybutynin (DITROPAN-XL) 5 MG 24 hr tablet Take 5 mg by mouth daily.     amLODipine (NORVASC) 5 MG tablet Take 5 mg by mouth daily.     atorvastatin (LIPITOR) 80 MG tablet Take 1 tablet (80 mg total) by mouth daily. 30 tablet 0   lisinopril-hydrochlorothiazide (ZESTORETIC) 20-25 MG tablet Take 1 tablet by mouth daily.     metFORMIN (GLUCOPHAGE) 500 MG tablet Take 1 tablet (500 mg total) by  mouth 2 (two) times daily with a meal. (Patient not taking: Reported on 01/09/2023)     No current facility-administered medications on file prior to visit.    There are no Patient Instructions on file for this visit. No follow-ups on file.   Georgiana Spinner, NP

## 2023-07-09 ENCOUNTER — Encounter (INDEPENDENT_AMBULATORY_CARE_PROVIDER_SITE_OTHER): Payer: Medicare Other

## 2023-07-09 ENCOUNTER — Ambulatory Visit (INDEPENDENT_AMBULATORY_CARE_PROVIDER_SITE_OTHER): Payer: Medicare Other | Admitting: Nurse Practitioner

## 2023-07-16 ENCOUNTER — Encounter (INDEPENDENT_AMBULATORY_CARE_PROVIDER_SITE_OTHER): Payer: Self-pay

## 2023-07-24 ENCOUNTER — Other Ambulatory Visit (INDEPENDENT_AMBULATORY_CARE_PROVIDER_SITE_OTHER): Payer: Self-pay | Admitting: Nurse Practitioner

## 2023-07-24 DIAGNOSIS — I6523 Occlusion and stenosis of bilateral carotid arteries: Secondary | ICD-10-CM

## 2023-07-30 ENCOUNTER — Encounter (INDEPENDENT_AMBULATORY_CARE_PROVIDER_SITE_OTHER): Payer: Self-pay | Admitting: Nurse Practitioner

## 2023-07-30 ENCOUNTER — Ambulatory Visit (INDEPENDENT_AMBULATORY_CARE_PROVIDER_SITE_OTHER): Admitting: Nurse Practitioner

## 2023-07-30 ENCOUNTER — Ambulatory Visit (INDEPENDENT_AMBULATORY_CARE_PROVIDER_SITE_OTHER)

## 2023-07-30 VITALS — BP 116/74 | HR 54 | Resp 18 | Ht 67.25 in | Wt 216.6 lb

## 2023-07-30 DIAGNOSIS — I700 Atherosclerosis of aorta: Secondary | ICD-10-CM | POA: Diagnosis not present

## 2023-07-30 DIAGNOSIS — E1122 Type 2 diabetes mellitus with diabetic chronic kidney disease: Secondary | ICD-10-CM

## 2023-07-30 DIAGNOSIS — I6523 Occlusion and stenosis of bilateral carotid arteries: Secondary | ICD-10-CM

## 2023-07-30 DIAGNOSIS — I10 Essential (primary) hypertension: Secondary | ICD-10-CM

## 2023-07-30 DIAGNOSIS — N182 Chronic kidney disease, stage 2 (mild): Secondary | ICD-10-CM

## 2023-07-30 NOTE — Progress Notes (Signed)
 Subjective:    Patient ID: Samuel [name], male    DOB: 06-28-1950, 73 y.o.   MRN: [id number] Chief Complaint  Patient presents with   Follow-up    fu 6 months + Carotid    The patient is seen for follow up evaluation of carotid stenosis. The carotid stenosis followed by ultrasound.   The patient denies amaurosis fugax. There is no recent history of TIA symptoms or focal motor deficits. There is no prior documented CVA.  The patient is taking enteric-coated aspirin  81 mg daily.  There is no history of migraine headaches. There is no history of seizures.  No recent shortening of the patient's walking distance or new symptoms consistent with claudication.  No history of rest pain symptoms. No new ulcers or wounds of the lower extremities have occurred.  He denies dizziness or syncopal symptoms.  He does have some numbness in his bilateral upper extremities but he has been told there are some nerve issues that need to be repaired.  It sounds somewhat like carpal tunnel syndrome.  He also has significant arthritis in the upper extremities.  There is no history of DVT, PE or superficial thrombophlebitis. No documented recent episodes of angina or shortness of breath documented.   Carotid Duplex done today shows 40-59 % stenosis of the right ICA.  Stent is patent.  It was previously 1 to 39% stenosis however there is only a slight change in velocities.  40 to 59% stenosis of the left ICA noted to be at the high end.  Previous study showed the patient in the 60 to 79% range of the ICA.  A new finding today notes turbulent flow within the left subclavian but no evidence of subclavian steal syndrome.    Review of Systems  All other systems reviewed and are negative.      Objective:   Physical Exam Vitals reviewed.  HENT:     Head: Normocephalic.  Neck:     Vascular: No carotid bruit.  Cardiovascular:     Rate and Rhythm: Normal rate.     Pulses:          Radial pulses are 2+ on the  right side and 2+ on the left side.  Pulmonary:     Effort: Pulmonary effort is normal.  Skin:    General: Skin is warm and dry.  Neurological:     Mental Status: He is alert and oriented to person, place, and time.  Psychiatric:        Mood and Affect: Mood normal.        Behavior: Behavior normal.        Thought Content: Thought content normal.        Judgment: Judgment normal.     BP 116/74   Pulse (!) 54   Resp 18   Ht 5' 7.25" (1.708 m)   Wt 216 lb 9.6 oz (98.2 kg)   BMI 33.67 kg/m   Past Medical History:  Diagnosis Date   Anxiety    situational   Arthritis    Cancer (HCC)    squamous cell carcinoma   ED (erectile dysfunction)    GERD (gastroesophageal reflux disease)    Hyperlipidemia    Hypertension    OSA (obstructive sleep apnea)    CPAP   Stroke (HCC)    no residual    Uncontrolled type 2 diabetes mellitus with hyperglycemia, with long-term current use of insulin  [facility]     Social History   Socioeconomic  History   Marital status: Married    Spouse name: Ammon Bales   Number of children: 3   Years of education: 12   Highest education level: Some college, no degree  Occupational History   Not on file  Tobacco Use   Smoking status: Never   Smokeless tobacco: Never  Vaping Use   Vaping status: Never Used  Substance and Sexual Activity   Alcohol use: Yes    Alcohol/week: 2.0 standard drinks of alcohol    Types: 2 Standard drinks or equivalent per week    Comment: occasion at this point   Drug use: Never   Sexual activity: Not Currently    Birth control/protection: Post-menopausal  Other Topics Concern   Not on file  Social History Narrative   Lives at home with wife.    Social Drivers of Corporate investment banker Strain: Not on file  Food Insecurity: No Food Insecurity (11/22/2021)   Hunger Vital Sign    Worried About Running Out of Food in the Last Year: Never true    Ran Out of Food in the Last Year: Never true  Transportation Needs: No  Transportation Needs (11/22/2021)   PRAPARE - Administrator, Civil Service (Medical): No    Lack of Transportation (Non-Medical): No  Physical Activity: Not on file  Stress: Not on file  Social Connections: Not on file  Intimate Partner Violence: Not At Risk (11/22/2021)   Humiliation, Afraid, Rape, and Kick questionnaire    Fear of Current or Ex-Partner: No    Emotionally Abused: No    Physically Abused: No    Sexually Abused: No    Past Surgical History:  Procedure Laterality Date   CARDIAC CATHETERIZATION     CAROTID PTA/STENT INTERVENTION Right 03/12/2022   Procedure: CAROTID PTA/STENT INTERVENTION;  Surgeon: Celso College, MD;  Location: [facility];  Service: Cardiovascular;  Laterality: Right;   COLONOSCOPY     CORONARY ARTERY BYPASS GRAFT N/A 11/24/2021   Procedure: CORONARY ARTERY BYPASS GRAFTING (CABG) X3 BYPASSES, USING OPEN LEFT INTERNAL MAMMARY ARTERY AND ENDOSCOPIC RIGHT GREATER SAPHENOUS VEIN HARVEST;  Surgeon: Melene Sportsman, MD;  Location: [facility] OR;  Service: Open Heart Surgery;  Laterality: N/A;   EXPLORATION POST OPERATIVE OPEN HEART  11/24/2021   Dr. Mathilda Solum : [facility] in [place]   KNEE ARTHROPLASTY Left 03/20/2019   Procedure: LEFT COMPUTER ASSISTED TOTAL KNEE ARTHROPLASTY;  Surgeon: Arlyne Lame, MD;  Location: [facility] ORS;  Service: Orthopedics;  Laterality: Left;   KNEE ARTHROPLASTY Right 11/09/2019   Procedure: COMPUTER ASSISTED TOTAL KNEE ARTHROPLASTY;  Surgeon: Arlyne Lame, MD;  Location: [facility] OR[facility];  Service: Orthopedics;  Laterality: Right;   KNEE ARTHROSCOPY Left    LEFT HEART CATH N/A 11/21/2021   Procedure: Left Heart Cath;  Surgeon: Percival Brace, MD;  Location: [facility];  Service: Cardiovascular;  Laterality: N/A;   SHOULDER ARTHROSCOPY Right 2009   rotator cuff   TEE WITHOUT CARDIOVERSION N/A 11/24/2021   Procedure: TRANSESOPHAGEAL ECHOCARDIOGRAM (TEE);  Surgeon: Melene Sportsman, MD;  Location: [facility] OR;   Service: Open Heart Surgery;  Laterality: N/A;   TONSILLECTOMY      Family History  Problem Relation Age of Onset   CAD Sister    CAD Brother     No Known Allergies     Latest Ref Rng & Units 03/13/2022    3:20 AM 11/28/2021    3:05 AM 11/27/2021    6:02 AM  CBC  WBC 4.0 - 10.5 K/uL 10.8  10.1  10.8   Hemoglobin 13.0 - 17.0 g/dL 44.0  9.4  9.0   Hematocrit 39.0 - 52.0 % 36.2  28.1  26.7   Platelets 150 - 400 K/uL 201  255  200       CMP     Component Value Date/Time   NA 137 03/13/2022 0320   K 3.7 03/13/2022 0320   CL 107 03/13/2022 0320   CO2 21 (L) 03/13/2022 0320   GLUCOSE 129 (H) 03/13/2022 0320   BUN 22 03/13/2022 0320   CREATININE 1.02 03/13/2022 0320   CALCIUM  8.8 (L) 03/13/2022 0320   PROT 7.9 11/20/2021 1956   ALBUMIN  3.9 11/20/2021 1956   AST 27 11/20/2021 1956   ALT 17 11/20/2021 1956   ALKPHOS 65 11/20/2021 1956   BILITOT 0.6 11/20/2021 1956   GFRNONAA >60 03/13/2022 0320     No results found.     Assessment & Plan:   1. Carotid artery stenosis, asymptomatic, bilateral Recommend:  Given the patient's asymptomatic subcritical stenosis no further invasive testing or surgery at this time.  Duplex ultrasound shows 40 to 59% % stenosis of the right ICA with 40 to 59% stenosis of the left ICA.  The left ICA stenosis is at the high end whereas the right stenosis is near the low end.  While the brackets have changed for the right it is very little difference in velocities.    Continue antiplatelet therapy as prescribed Continue management of CAD, HTN and Hyperlipidemia Healthy heart diet,  encouraged exercise at least 4 times per week Follow up in 6 months with duplex ultrasound and physical exam   2. Diabetes mellitus with stage 2 chronic kidney disease (HCC) Continue hypoglycemic medications as already ordered, these medications have been reviewed and there are no changes at this time.  Hgb A1C to be monitored as already arranged by primary  service  3. Essential hypertension Continue antihypertensive medications as already ordered, these medications have been reviewed and there are no changes at this time.  4. Aortic atherosclerosis (HCC) The patient has no evidence of an abdominal aortic aneurysm.  He has known aortic atherosclerosis which was present today.  However it is not significant.  We can follow this every 2 years or sooner if issues arise.  It was last checked in May 2024   Current Outpatient Medications on File Prior to Visit  Medication Sig Dispense Refill   acetaminophen  (TYLENOL ) 500 MG tablet Take 1-2 tablets (500-1,000 mg total) by mouth every 6 (six) hours as needed. 30 tablet 0   amLODipine  (NORVASC ) 5 MG tablet Take 5 mg by mouth daily.     aspirin  81 MG EC tablet Take 81 mg by mouth daily.      atorvastatin  (LIPITOR ) 80 MG tablet Take 1 tablet (80 mg total) by mouth daily. 30 tablet 0   lisinopril  (ZESTRIL ) 5 MG tablet Take 5 mg by mouth daily.     lisinopril -hydrochlorothiazide  (ZESTORETIC ) 20-25 MG tablet Take 1 tablet by mouth daily.     loratadine  (CLARITIN ) 10 MG tablet Take 10 mg by mouth daily.     metoprolol  tartrate (LOPRESSOR ) 25 MG tablet Take 0.5 tablets (12.5 mg total) by mouth 2 (two) times daily. 60 tablet 3   nitroGLYCERIN  (NITROSTAT ) 0.4 MG SL tablet Place 0.4 mg under the tongue every 5 (five) minutes as needed for chest pain.     omeprazole (PRILOSEC) 20 MG capsule Take 20 mg by mouth  daily before breakfast.     ONETOUCH ULTRA test strip CHECK BLOOD SUGAR TWICE DAILY.     oxybutynin  (DITROPAN -XL) 5 MG 24 hr tablet Take 5 mg by mouth daily.     clopidogrel  (PLAVIX ) 75 MG tablet Take 1 tablet by mouth daily. (Patient not taking: Reported on 07/30/2023)     JARDIANCE 10 MG TABS tablet Take 10 mg by mouth daily.     metFORMIN  (GLUCOPHAGE ) 500 MG tablet Take 1 tablet (500 mg total) by mouth 2 (two) times daily with a meal. (Patient not taking: Reported on 07/30/2023)     No current  facility-administered medications on file prior to visit.    There are no Patient Instructions on file for this visit. No follow-ups on file.   Sharrod Achille E Kameran Lallier, NP

## 2024-01-22 ENCOUNTER — Other Ambulatory Visit (INDEPENDENT_AMBULATORY_CARE_PROVIDER_SITE_OTHER): Payer: Self-pay | Admitting: Nurse Practitioner

## 2024-01-22 DIAGNOSIS — I6523 Occlusion and stenosis of bilateral carotid arteries: Secondary | ICD-10-CM

## 2024-01-29 ENCOUNTER — Ambulatory Visit (INDEPENDENT_AMBULATORY_CARE_PROVIDER_SITE_OTHER)

## 2024-01-29 ENCOUNTER — Encounter (INDEPENDENT_AMBULATORY_CARE_PROVIDER_SITE_OTHER): Payer: Self-pay | Admitting: Nurse Practitioner

## 2024-01-29 ENCOUNTER — Ambulatory Visit (INDEPENDENT_AMBULATORY_CARE_PROVIDER_SITE_OTHER): Admitting: Nurse Practitioner

## 2024-01-29 VITALS — BP 126/66 | HR 65 | Resp 18 | Ht 68.0 in | Wt 214.6 lb

## 2024-01-29 DIAGNOSIS — E785 Hyperlipidemia, unspecified: Secondary | ICD-10-CM | POA: Diagnosis not present

## 2024-01-29 DIAGNOSIS — I6523 Occlusion and stenosis of bilateral carotid arteries: Secondary | ICD-10-CM

## 2024-01-29 DIAGNOSIS — I10 Essential (primary) hypertension: Secondary | ICD-10-CM | POA: Diagnosis not present

## 2024-02-02 ENCOUNTER — Encounter (INDEPENDENT_AMBULATORY_CARE_PROVIDER_SITE_OTHER): Payer: Self-pay | Admitting: Nurse Practitioner

## 2024-02-02 NOTE — Progress Notes (Signed)
 Subjective:    Patient ID: Samuel [name], male    DOB: 08/11/1950, 73 y.o.   MRN: [id number] Chief Complaint  Patient presents with   Follow-up    6 month follow up + carotid    HPI  Discussed the use of AI scribe software for clinical note transcription with the patient, who gave verbal consent to proceed.  History of Present Illness Samuel [name] is a 73 year old male with carotid artery stenosis who presents for a follow-up evaluation.  The right carotid artery, which has a stent, remains less than fifty percent stenosed, consistent with previous studies. The left carotid artery stenosis remains stable at forty to fifty-nine percent.  He has a history of a stroke in 33 at the age of 50, which was unrelated to carotid issues but rather occurred in the brain.    Results RADIOLOGY Carotid Doppler Ultrasound: Right carotid artery stent patent, less than 50% stenosis. Left carotid artery 40-59% stenosis. Right subclavian artery normal flow. Vertebral arteries antegrade flow. (01/29/2024)   Review of Systems  All other systems reviewed and are negative.      Objective:   Physical Exam Vitals reviewed.  HENT:     Head: Normocephalic.  Neck:     Vascular: Carotid bruit present.  Cardiovascular:     Rate and Rhythm: Normal rate and regular rhythm.     Pulses: Normal pulses.  Pulmonary:     Effort: Pulmonary effort is normal.  Skin:    General: Skin is warm and dry.  Neurological:     Mental Status: He is alert and oriented to person, place, and time.  Psychiatric:        Mood and Affect: Mood normal.        Behavior: Behavior normal.        Thought Content: Thought content normal.        Judgment: Judgment normal.     Physical Exam CARDIOVASCULAR: Heart sounds normal, no murmurs. Light bruit on right carotid due to stent. Pulses good bilaterally.  BP 126/66 (BP Location: Left Arm, Patient Position: Sitting, Cuff Size: Normal)   Pulse 65   Resp 18   Ht 5' 8  (1.727 m)   Wt 214 lb 9.6 oz (97.3 kg)   BMI 32.63 kg/m   Past Medical History:  Diagnosis Date   Anxiety    situational   Arthritis    Cancer (HCC)    squamous cell carcinoma   ED (erectile dysfunction)    GERD (gastroesophageal reflux disease)    Hyperlipidemia    Hypertension    OSA (obstructive sleep apnea)    CPAP   Stroke (HCC)    no residual    Uncontrolled type 2 diabetes mellitus with hyperglycemia, with long-term current use of insulin  (HCC)     Social History   Socioeconomic History   Marital status: Married    Spouse name: Verneita   Number of children: 3   Years of education: 12   Highest education level: Some college, no degree  Occupational History   Not on file  Tobacco Use   Smoking status: Never   Smokeless tobacco: Never  Vaping Use   Vaping status: Never Used  Substance and Sexual Activity   Alcohol use: Yes    Alcohol/week: 2.0 standard drinks of alcohol    Types: 2 Standard drinks or equivalent per week    Comment: occasion at this point   Drug use: Never   Sexual activity:  Not Currently    Birth control/protection: Post-menopausal  Other Topics Concern   Not on file  Social History Narrative   Lives at home with wife.    Social Drivers of Corporate Investment Banker Strain: Not on file  Food Insecurity: No Food Insecurity (11/22/2021)   Hunger Vital Sign    Worried About Running Out of Food in the Last Year: Never true    Ran Out of Food in the Last Year: Never true  Transportation Needs: No Transportation Needs (11/22/2021)   PRAPARE - Administrator, Civil Service (Medical): No    Lack of Transportation (Non-Medical): No  Physical Activity: Not on file  Stress: Not on file  Social Connections: Not on file  Intimate Partner Violence: Not At Risk (11/22/2021)   Humiliation, Afraid, Rape, and Kick questionnaire    Fear of Current or Ex-Partner: No    Emotionally Abused: No    Physically Abused: No    Sexually Abused: No     Past Surgical History:  Procedure Laterality Date   CARDIAC CATHETERIZATION     CAROTID PTA/STENT INTERVENTION Right 03/12/2022   Procedure: CAROTID PTA/STENT INTERVENTION;  Surgeon: Marea Selinda RAMAN, MD;  Location: [facility];  Service: Cardiovascular;  Laterality: Right;   COLONOSCOPY     CORONARY ARTERY BYPASS GRAFT N/A 11/24/2021   Procedure: CORONARY ARTERY BYPASS GRAFTING (CABG) X3 BYPASSES, USING OPEN LEFT INTERNAL MAMMARY ARTERY AND ENDOSCOPIC RIGHT GREATER SAPHENOUS VEIN HARVEST;  Surgeon: Maryjane Mt, MD;  Location: [facility] OR;  Service: Open Heart Surgery;  Laterality: N/A;   EXPLORATION POST OPERATIVE OPEN HEART  11/24/2021   Dr. Crista : [facility] in [place]   KNEE ARTHROPLASTY Left 03/20/2019   Procedure: LEFT COMPUTER ASSISTED TOTAL KNEE ARTHROPLASTY;  Surgeon: Mardee Lynwood SQUIBB, MD;  Location: [facility] ORS;  Service: Orthopedics;  Laterality: Left;   KNEE ARTHROPLASTY Right 11/09/2019   Procedure: COMPUTER ASSISTED TOTAL KNEE ARTHROPLASTY;  Surgeon: Mardee Lynwood SQUIBB, MD;  Location: [facility] OR[facility];  Service: Orthopedics;  Laterality: Right;   KNEE ARTHROSCOPY Left    LEFT HEART CATH N/A 11/21/2021   Procedure: Left Heart Cath;  Surgeon: Ammon Blunt, MD;  Location: [facility];  Service: Cardiovascular;  Laterality: N/A;   SHOULDER ARTHROSCOPY Right 2009   rotator cuff   TEE WITHOUT CARDIOVERSION N/A 11/24/2021   Procedure: TRANSESOPHAGEAL ECHOCARDIOGRAM (TEE);  Surgeon: Maryjane Mt, MD;  Location: [facility] OR;  Service: Open Heart Surgery;  Laterality: N/A;   TONSILLECTOMY      Family History  Problem Relation Age of Onset   CAD Sister    CAD Brother     No Known Allergies     Latest Ref Rng & Units 03/13/2022    3:20 AM 11/28/2021    3:05 AM 11/27/2021    6:02 AM  CBC  WBC 4.0 - 10.5 K/uL 10.8  10.1  10.8   Hemoglobin 13.0 - 17.0 g/dL 87.8  9.4  9.0   Hematocrit 39.0 - 52.0 % 36.2  28.1  26.7   Platelets 150 - 400 K/uL 201  255  200        CMP     Component Value Date/Time   NA 137 03/13/2022 0320   K 3.7 03/13/2022 0320   CL 107 03/13/2022 0320   CO2 21 (L) 03/13/2022 0320   GLUCOSE 129 (H) 03/13/2022 0320   BUN 22 03/13/2022 0320   CREATININE 1.02 03/13/2022 0320   CALCIUM  8.8 (L) 03/13/2022  0320   PROT 7.9 11/20/2021 1956   ALBUMIN  3.9 11/20/2021 1956   AST 27 11/20/2021 1956   ALT 17 11/20/2021 1956   ALKPHOS 65 11/20/2021 1956   BILITOT 0.6 11/20/2021 1956   GFRNONAA >60 03/13/2022 0320     No results found.     Assessment & Plan:   1. Bilateral carotid artery stenosis (Primary) Bilateral carotid artery stenosis with right carotid stent Bilateral carotid stenosis stable. Right carotid stent patent with <50% stenosis. Left carotid stenosis stable at 40-59%. Right subclavian flow normal. Vertebral and subclavian arteries with good antegrade flow. No stroke symptoms. - Continue annual carotid artery monitoring. - Educated on stroke symptoms and advised immediate ER visit if symptoms occur. - VAS US  CAROTID; Future  2. Essential hypertension Continue antihypertensive medications as already ordered, these medications have been reviewed and there are no changes at this time.  3. Dyslipidemia Continue statin as ordered and reviewed, no changes at this time  Current Outpatient Medications on File Prior to Visit  Medication Sig Dispense Refill   acetaminophen  (TYLENOL ) 500 MG tablet Take 1-2 tablets (500-1,000 mg total) by mouth every 6 (six) hours as needed. 30 tablet 0   amLODipine  (NORVASC ) 5 MG tablet Take 5 mg by mouth daily.     aspirin  81 MG EC tablet Take 81 mg by mouth daily.      atorvastatin  (LIPITOR ) 80 MG tablet Take 1 tablet (80 mg total) by mouth daily. 30 tablet 0   JARDIANCE 10 MG TABS tablet Take 10 mg by mouth daily.     lisinopril  (ZESTRIL ) 5 MG tablet Take 5 mg by mouth daily.     lisinopril -hydrochlorothiazide  (ZESTORETIC ) 20-25 MG tablet Take 1 tablet by mouth daily.      loratadine  (CLARITIN ) 10 MG tablet Take 10 mg by mouth daily.     metFORMIN  (GLUCOPHAGE ) 500 MG tablet Take 1 tablet (500 mg total) by mouth 2 (two) times daily with a meal.     metoprolol  tartrate (LOPRESSOR ) 25 MG tablet Take 0.5 tablets (12.5 mg total) by mouth 2 (two) times daily. 60 tablet 3   nitroGLYCERIN  (NITROSTAT ) 0.4 MG SL tablet Place 0.4 mg under the tongue every 5 (five) minutes as needed for chest pain.     omeprazole (PRILOSEC) 20 MG capsule Take 20 mg by mouth daily before breakfast.     ONETOUCH ULTRA test strip CHECK BLOOD SUGAR TWICE DAILY.     oxybutynin  (DITROPAN -XL) 5 MG 24 hr tablet Take 5 mg by mouth daily.     clopidogrel  (PLAVIX ) 75 MG tablet Take 1 tablet by mouth daily. (Patient not taking: Reported on 07/30/2023)     No current facility-administered medications on file prior to visit.    There are no Patient Instructions on file for this visit. Return in about 1 year (around 01/28/2025) for 1 year carotid duplex JD/FB.   Eulan Heyward E Aviv Lengacher, NP

## 2025-01-28 ENCOUNTER — Ambulatory Visit (INDEPENDENT_AMBULATORY_CARE_PROVIDER_SITE_OTHER): Admitting: Nurse Practitioner

## 2025-01-28 ENCOUNTER — Encounter (INDEPENDENT_AMBULATORY_CARE_PROVIDER_SITE_OTHER)
